# Patient Record
Sex: Male | Born: 1960 | Race: White | Hispanic: No | Marital: Single | Smoking: Current every day smoker
Health system: Southern US, Community
[De-identification: ages and names within clinical notes are randomized; demographics above are authoritative.]

## PROBLEM LIST (undated history)

## (undated) DIAGNOSIS — I219 Acute myocardial infarction, unspecified: Secondary | ICD-10-CM

## (undated) DIAGNOSIS — M1990 Unspecified osteoarthritis, unspecified site: Secondary | ICD-10-CM

## (undated) DIAGNOSIS — G629 Polyneuropathy, unspecified: Secondary | ICD-10-CM

## (undated) DIAGNOSIS — H3500 Unspecified background retinopathy: Secondary | ICD-10-CM

## (undated) DIAGNOSIS — C801 Malignant (primary) neoplasm, unspecified: Secondary | ICD-10-CM

## (undated) DIAGNOSIS — N186 End stage renal disease: Secondary | ICD-10-CM

## (undated) DIAGNOSIS — K219 Gastro-esophageal reflux disease without esophagitis: Secondary | ICD-10-CM

## (undated) DIAGNOSIS — I10 Essential (primary) hypertension: Secondary | ICD-10-CM

## (undated) DIAGNOSIS — I2510 Atherosclerotic heart disease of native coronary artery without angina pectoris: Secondary | ICD-10-CM

## (undated) DIAGNOSIS — F329 Major depressive disorder, single episode, unspecified: Secondary | ICD-10-CM

## (undated) DIAGNOSIS — Z992 Dependence on renal dialysis: Secondary | ICD-10-CM

## (undated) DIAGNOSIS — K209 Esophagitis, unspecified: Secondary | ICD-10-CM

## (undated) DIAGNOSIS — K769 Liver disease, unspecified: Secondary | ICD-10-CM

## (undated) DIAGNOSIS — F32A Depression, unspecified: Secondary | ICD-10-CM

## (undated) DIAGNOSIS — F419 Anxiety disorder, unspecified: Secondary | ICD-10-CM

## (undated) HISTORY — PX: EYE SURGERY: SHX253

## (undated) HISTORY — PX: AMPUTATION: SHX166

## (undated) HISTORY — PX: COLOSTOMY: SHX63

## (undated) HISTORY — DX: Atherosclerotic heart disease of native coronary artery without angina pectoris: I25.10

---

## 1998-02-03 DIAGNOSIS — I219 Acute myocardial infarction, unspecified: Secondary | ICD-10-CM

## 1998-02-03 HISTORY — DX: Acute myocardial infarction, unspecified: I21.9

## 2007-02-04 HISTORY — PX: COLON SURGERY: SHX602

## 2010-03-29 DIAGNOSIS — I517 Cardiomegaly: Secondary | ICD-10-CM

## 2010-04-08 DIAGNOSIS — R072 Precordial pain: Secondary | ICD-10-CM

## 2010-05-28 ENCOUNTER — Ambulatory Visit (INDEPENDENT_AMBULATORY_CARE_PROVIDER_SITE_OTHER): Payer: Medicare Other | Admitting: Internal Medicine

## 2010-07-04 ENCOUNTER — Ambulatory Visit (INDEPENDENT_AMBULATORY_CARE_PROVIDER_SITE_OTHER): Payer: Medicare Other | Admitting: Internal Medicine

## 2011-02-04 DIAGNOSIS — K209 Esophagitis, unspecified without bleeding: Secondary | ICD-10-CM

## 2011-02-04 HISTORY — DX: Esophagitis, unspecified without bleeding: K20.90

## 2011-03-10 ENCOUNTER — Other Ambulatory Visit (HOSPITAL_COMMUNITY): Payer: Self-pay | Admitting: Family Medicine

## 2011-03-10 DIAGNOSIS — R918 Other nonspecific abnormal finding of lung field: Secondary | ICD-10-CM

## 2011-03-18 ENCOUNTER — Other Ambulatory Visit (HOSPITAL_COMMUNITY): Payer: Medicare Other

## 2011-04-09 ENCOUNTER — Encounter (HOSPITAL_COMMUNITY)
Admission: RE | Admit: 2011-04-09 | Discharge: 2011-04-09 | Disposition: A | Discharge status: Home / Self Care | Payer: Medicare Other | Source: Ambulatory Visit | Attending: Family Medicine | Admitting: Family Medicine

## 2011-04-09 DIAGNOSIS — R918 Other nonspecific abnormal finding of lung field: Secondary | ICD-10-CM

## 2011-04-09 DIAGNOSIS — J984 Other disorders of lung: Secondary | ICD-10-CM | POA: Insufficient documentation

## 2011-04-09 DIAGNOSIS — R222 Localized swelling, mass and lump, trunk: Secondary | ICD-10-CM | POA: Insufficient documentation

## 2011-04-09 DIAGNOSIS — J8410 Pulmonary fibrosis, unspecified: Secondary | ICD-10-CM | POA: Insufficient documentation

## 2011-04-09 LAB — GLUCOSE, CAPILLARY: Glucose-Capillary: 280 mg/dL — ABNORMAL HIGH (ref 70–99)

## 2011-04-09 MED ORDER — FLUDEOXYGLUCOSE F - 18 (FDG) INJECTION
14.0000 | Freq: Once | INTRAVENOUS | Status: AC | PRN
  Administered 2011-04-09: 14 via INTRAVENOUS

## 2011-05-22 ENCOUNTER — Inpatient Hospital Stay (HOSPITAL_COMMUNITY): Payer: Medicare Other

## 2011-05-22 ENCOUNTER — Other Ambulatory Visit (HOSPITAL_COMMUNITY): Payer: Medicare Other

## 2011-05-22 ENCOUNTER — Observation Stay (HOSPITAL_COMMUNITY): Payer: Medicare Other

## 2011-05-22 ENCOUNTER — Inpatient Hospital Stay (HOSPITAL_COMMUNITY)
Admission: EM | Admit: 2011-05-22 | Discharge: 2011-05-26 | DRG: 391 | Disposition: A | Discharge status: Home / Self Care | Payer: Medicare Other | Source: Ambulatory Visit | Attending: Cardiovascular Disease | Admitting: Cardiovascular Disease

## 2011-05-22 ENCOUNTER — Encounter (HOSPITAL_COMMUNITY): Payer: Self-pay | Admitting: Physician Assistant

## 2011-05-22 ENCOUNTER — Encounter (HOSPITAL_COMMUNITY)
Admission: EM | Disposition: A | Discharge status: Home / Self Care | Payer: Self-pay | Source: Ambulatory Visit | Attending: Cardiovascular Disease

## 2011-05-22 DIAGNOSIS — K209 Esophagitis, unspecified without bleeding: Principal | ICD-10-CM | POA: Diagnosis present

## 2011-05-22 DIAGNOSIS — I517 Cardiomegaly: Secondary | ICD-10-CM

## 2011-05-22 DIAGNOSIS — Z794 Long term (current) use of insulin: Secondary | ICD-10-CM

## 2011-05-22 DIAGNOSIS — E875 Hyperkalemia: Secondary | ICD-10-CM | POA: Diagnosis present

## 2011-05-22 DIAGNOSIS — E1142 Type 2 diabetes mellitus with diabetic polyneuropathy: Secondary | ICD-10-CM | POA: Diagnosis present

## 2011-05-22 DIAGNOSIS — K2970 Gastritis, unspecified, without bleeding: Secondary | ICD-10-CM | POA: Diagnosis present

## 2011-05-22 DIAGNOSIS — I214 Non-ST elevation (NSTEMI) myocardial infarction: Secondary | ICD-10-CM

## 2011-05-22 DIAGNOSIS — I129 Hypertensive chronic kidney disease with stage 1 through stage 4 chronic kidney disease, or unspecified chronic kidney disease: Secondary | ICD-10-CM | POA: Diagnosis present

## 2011-05-22 DIAGNOSIS — R0789 Other chest pain: Secondary | ICD-10-CM | POA: Diagnosis present

## 2011-05-22 DIAGNOSIS — N179 Acute kidney failure, unspecified: Secondary | ICD-10-CM | POA: Diagnosis present

## 2011-05-22 DIAGNOSIS — E1139 Type 2 diabetes mellitus with other diabetic ophthalmic complication: Secondary | ICD-10-CM | POA: Diagnosis present

## 2011-05-22 DIAGNOSIS — E11319 Type 2 diabetes mellitus with unspecified diabetic retinopathy without macular edema: Secondary | ICD-10-CM | POA: Diagnosis present

## 2011-05-22 DIAGNOSIS — Z85038 Personal history of other malignant neoplasm of large intestine: Secondary | ICD-10-CM

## 2011-05-22 DIAGNOSIS — I2510 Atherosclerotic heart disease of native coronary artery without angina pectoris: Secondary | ICD-10-CM | POA: Diagnosis present

## 2011-05-22 DIAGNOSIS — D62 Acute posthemorrhagic anemia: Secondary | ICD-10-CM | POA: Diagnosis present

## 2011-05-22 DIAGNOSIS — Z87891 Personal history of nicotine dependence: Secondary | ICD-10-CM

## 2011-05-22 DIAGNOSIS — N183 Chronic kidney disease, stage 3 unspecified: Secondary | ICD-10-CM | POA: Diagnosis present

## 2011-05-22 DIAGNOSIS — E1310 Other specified diabetes mellitus with ketoacidosis without coma: Secondary | ICD-10-CM | POA: Diagnosis present

## 2011-05-22 DIAGNOSIS — D631 Anemia in chronic kidney disease: Secondary | ICD-10-CM | POA: Diagnosis present

## 2011-05-22 DIAGNOSIS — E1149 Type 2 diabetes mellitus with other diabetic neurological complication: Secondary | ICD-10-CM | POA: Diagnosis present

## 2011-05-22 DIAGNOSIS — R079 Chest pain, unspecified: Secondary | ICD-10-CM

## 2011-05-22 DIAGNOSIS — I10 Essential (primary) hypertension: Secondary | ICD-10-CM | POA: Diagnosis present

## 2011-05-22 DIAGNOSIS — E119 Type 2 diabetes mellitus without complications: Secondary | ICD-10-CM | POA: Diagnosis present

## 2011-05-22 DIAGNOSIS — E1165 Type 2 diabetes mellitus with hyperglycemia: Secondary | ICD-10-CM | POA: Diagnosis present

## 2011-05-22 HISTORY — DX: Essential (primary) hypertension: I10

## 2011-05-22 HISTORY — DX: Polyneuropathy, unspecified: G62.9

## 2011-05-22 HISTORY — DX: Malignant (primary) neoplasm, unspecified: C80.1

## 2011-05-22 HISTORY — DX: Unspecified background retinopathy: H35.00

## 2011-05-22 HISTORY — PX: LEFT HEART CATHETERIZATION WITH CORONARY ANGIOGRAM: SHX5451

## 2011-05-22 HISTORY — DX: Gastro-esophageal reflux disease without esophagitis: K21.9

## 2011-05-22 HISTORY — PX: CARDIAC CATHETERIZATION: SHX172

## 2011-05-22 LAB — DIFFERENTIAL
Eosinophils Absolute: 0 10*3/uL (ref 0.0–0.7)
Eosinophils Relative: 0 % (ref 0–5)
Lymphs Abs: 1.2 10*3/uL (ref 0.7–4.0)
Monocytes Absolute: 0.5 10*3/uL (ref 0.1–1.0)
Monocytes Relative: 3 % (ref 3–12)

## 2011-05-22 LAB — BASIC METABOLIC PANEL
BUN: 56 mg/dL — ABNORMAL HIGH (ref 6–23)
Creatinine, Ser: 4.79 mg/dL — ABNORMAL HIGH (ref 0.50–1.35)
GFR calc non Af Amer: 13 mL/min — ABNORMAL LOW (ref >90)
Glucose, Bld: 191 mg/dL — ABNORMAL HIGH (ref 70–99)
Potassium: 5.9 mEq/L — ABNORMAL HIGH (ref 3.5–5.1)

## 2011-05-22 LAB — COMPREHENSIVE METABOLIC PANEL
ALT: 19 U/L (ref 0–53)
AST: 13 U/L (ref 0–37)
Albumin: 2.6 g/dL — ABNORMAL LOW (ref 3.5–5.2)
Alkaline Phosphatase: 145 U/L — ABNORMAL HIGH (ref 39–117)
Potassium: 5 mEq/L (ref 3.5–5.1)
Sodium: 136 mEq/L (ref 135–145)
Total Protein: 6 g/dL (ref 6.0–8.3)

## 2011-05-22 LAB — CBC
HCT: 37.8 % — ABNORMAL LOW (ref 39.0–52.0)
Hemoglobin: 12.7 g/dL — ABNORMAL LOW (ref 13.0–17.0)
Hemoglobin: 12.8 g/dL — ABNORMAL LOW (ref 13.0–17.0)
MCHC: 34 g/dL (ref 30.0–36.0)
RBC: 4.38 MIL/uL (ref 4.22–5.81)
RDW: 13.5 % (ref 11.5–15.5)

## 2011-05-22 LAB — CREATININE, SERUM
Creatinine, Ser: 4.67 mg/dL — ABNORMAL HIGH (ref 0.50–1.35)
GFR calc non Af Amer: 13 mL/min — ABNORMAL LOW (ref >90)

## 2011-05-22 LAB — POCT I-STAT, CHEM 8
BUN: 51 mg/dL — ABNORMAL HIGH (ref 6–23)
Creatinine, Ser: 4.8 mg/dL — ABNORMAL HIGH (ref 0.50–1.35)
Potassium: 5.1 mEq/L (ref 3.5–5.1)
Sodium: 138 mEq/L (ref 135–145)

## 2011-05-22 LAB — CARDIAC PANEL(CRET KIN+CKTOT+MB+TROPI)
CK, MB: 3 ng/mL (ref 0.3–4.0)
Relative Index: INVALID (ref 0.0–2.5)
Relative Index: INVALID (ref 0.0–2.5)
Relative Index: INVALID (ref 0.0–2.5)
Total CK: 68 U/L (ref 7–232)
Total CK: 83 U/L (ref 7–232)

## 2011-05-22 LAB — GLUCOSE, CAPILLARY
Glucose-Capillary: 402 mg/dL — ABNORMAL HIGH (ref 70–99)
Glucose-Capillary: 290 mg/dL — ABNORMAL HIGH (ref 70–99)
Glucose-Capillary: 238 mg/dL — ABNORMAL HIGH (ref 70–99)
Glucose-Capillary: 101 mg/dL — ABNORMAL HIGH (ref 70–99)
Glucose-Capillary: 136 mg/dL — ABNORMAL HIGH (ref 70–99)
Glucose-Capillary: 216 mg/dL — ABNORMAL HIGH (ref 70–99)

## 2011-05-22 LAB — LIPID PANEL
HDL: 32 mg/dL — ABNORMAL LOW (ref >39)
LDL Cholesterol: 93 mg/dL (ref 0–99)
Triglycerides: 270 mg/dL — ABNORMAL HIGH (ref <150)
VLDL: 54 mg/dL — ABNORMAL HIGH (ref 0–40)

## 2011-05-22 LAB — MAGNESIUM: Magnesium: 1.9 mg/dL (ref 1.5–2.5)

## 2011-05-22 LAB — MRSA PCR SCREENING: MRSA by PCR: NEGATIVE

## 2011-05-22 LAB — TSH: TSH: 1.238 u[IU]/mL (ref 0.350–4.500)

## 2011-05-22 SURGERY — LEFT HEART CATHETERIZATION WITH CORONARY ANGIOGRAM
Anesthesia: LOCAL

## 2011-05-22 MED ORDER — METOPROLOL TARTRATE 1 MG/ML IV SOLN
INTRAVENOUS | Status: AC
  Filled 2011-05-22: qty 5

## 2011-05-22 MED ORDER — SODIUM CHLORIDE 0.9 % IV SOLN
INTRAVENOUS | Status: DC
  Administered 2011-05-22: 3.3 [IU]/h via INTRAVENOUS
  Filled 2011-05-22: qty 1

## 2011-05-22 MED ORDER — INSULIN GLARGINE 100 UNIT/ML ~~LOC~~ SOLN
50.0000 [IU] | Freq: Once | SUBCUTANEOUS | Status: AC
  Administered 2011-05-22: 50 [IU] via SUBCUTANEOUS

## 2011-05-22 MED ORDER — HEPARIN SODIUM (PORCINE) 1000 UNIT/ML IJ SOLN
INTRAMUSCULAR | Status: AC
  Filled 2011-05-22: qty 1

## 2011-05-22 MED ORDER — SODIUM CHLORIDE 0.9 % IV SOLN: 250.0000 mL | INTRAVENOUS | Status: DC | PRN

## 2011-05-22 MED ORDER — SODIUM CHLORIDE 0.9 % IV SOLN: INTRAVENOUS | Status: DC

## 2011-05-22 MED ORDER — FENTANYL CITRATE 0.05 MG/ML IJ SOLN
INTRAMUSCULAR | Status: AC
  Filled 2011-05-22: qty 2

## 2011-05-22 MED ORDER — DEXTROSE-NACL 5-0.45 % IV SOLN: INTRAVENOUS | Status: DC

## 2011-05-22 MED ORDER — ACETAMINOPHEN 325 MG PO TABS: 650.0000 mg | ORAL_TABLET | ORAL | Status: DC | PRN

## 2011-05-22 MED ORDER — SODIUM CHLORIDE 0.9 % IV SOLN
80.0000 mg | Freq: Once | INTRAVENOUS | Status: AC
  Administered 2011-05-23: 80 mg via INTRAVENOUS
  Filled 2011-05-22 (×3): qty 80

## 2011-05-22 MED ORDER — OXYCODONE HCL 5 MG PO TABS: 5.0000 mg | ORAL_TABLET | ORAL | Status: DC | PRN

## 2011-05-22 MED ORDER — ATORVASTATIN CALCIUM 40 MG PO TABS
40.0000 mg | ORAL_TABLET | Freq: Every day | ORAL | Status: DC
  Administered 2011-05-22 – 2011-05-26 (×5): 40 mg via ORAL
  Filled 2011-05-22 (×5): qty 1

## 2011-05-22 MED ORDER — INSULIN ASPART 100 UNIT/ML ~~LOC~~ SOLN: 0.0000 [IU] | Freq: Three times a day (TID) | SUBCUTANEOUS | Status: DC

## 2011-05-22 MED ORDER — ACETONE (URINE) TEST VI STRP: 1.0000 | ORAL_STRIP | Status: DC | PRN

## 2011-05-22 MED ORDER — INSULIN REGULAR BOLUS VIA INFUSION: 0.0000 [IU] | Freq: Three times a day (TID) | INTRAVENOUS | Status: DC

## 2011-05-22 MED ORDER — ASPIRIN EC 325 MG PO TBEC: 325.0000 mg | DELAYED_RELEASE_TABLET | Freq: Every day | ORAL | Status: DC

## 2011-05-22 MED ORDER — ONDANSETRON HCL 4 MG/2ML IJ SOLN
INTRAMUSCULAR | Status: AC
  Administered 2011-05-23: 4 mg via INTRAVENOUS
  Filled 2011-05-22: qty 2

## 2011-05-22 MED ORDER — ONDANSETRON HCL 4 MG/2ML IJ SOLN: 4.0000 mg | Freq: Four times a day (QID) | INTRAMUSCULAR | Status: DC | PRN

## 2011-05-22 MED ORDER — DEXTROSE 50 % IV SOLN: 25.0000 mL | INTRAVENOUS | Status: DC | PRN

## 2011-05-22 MED ORDER — DEXTROSE-NACL 5-0.45 % IV SOLN
INTRAVENOUS | Status: DC
  Administered 2011-05-22: 100 mL/h via INTRAVENOUS

## 2011-05-22 MED ORDER — SODIUM CHLORIDE 0.9 % IV SOLN
8.0000 mg/h | INTRAVENOUS | Status: DC
  Administered 2011-05-22 – 2011-05-25 (×5): 8 mg/h via INTRAVENOUS
  Filled 2011-05-22 (×13): qty 80

## 2011-05-22 MED ORDER — MIDAZOLAM HCL 2 MG/2ML IJ SOLN
INTRAMUSCULAR | Status: AC
  Filled 2011-05-22: qty 2

## 2011-05-22 MED ORDER — ALPRAZOLAM 0.25 MG PO TABS
0.2500 mg | ORAL_TABLET | Freq: Two times a day (BID) | ORAL | Status: DC | PRN
  Administered 2011-05-23: 0.25 mg via ORAL
  Filled 2011-05-22: qty 1

## 2011-05-22 MED ORDER — METOPROLOL TARTRATE 25 MG PO TABS
25.0000 mg | ORAL_TABLET | Freq: Two times a day (BID) | ORAL | Status: DC
  Administered 2011-05-22 – 2011-05-23 (×3): 25 mg via ORAL
  Filled 2011-05-22 (×3): qty 1

## 2011-05-22 MED ORDER — NITROGLYCERIN 0.4 MG SL SUBL: 0.4000 mg | SUBLINGUAL_TABLET | SUBLINGUAL | Status: DC | PRN

## 2011-05-22 MED ORDER — SODIUM CHLORIDE 0.9 % IJ SOLN: 3.0000 mL | INTRAMUSCULAR | Status: DC | PRN

## 2011-05-22 MED ORDER — HYDROMORPHONE HCL PF 1 MG/ML IJ SOLN
INTRAMUSCULAR | Status: AC
  Filled 2011-05-22: qty 1

## 2011-05-22 MED ORDER — ZOLPIDEM TARTRATE 5 MG PO TABS: 5.0000 mg | ORAL_TABLET | Freq: Every evening | ORAL | Status: DC | PRN

## 2011-05-22 MED ORDER — HEPARIN SODIUM (PORCINE) 5000 UNIT/ML IJ SOLN
5000.0000 [IU] | Freq: Three times a day (TID) | INTRAMUSCULAR | Status: DC
  Filled 2011-05-22 (×2): qty 1

## 2011-05-22 MED ORDER — ONDANSETRON HCL 4 MG/2ML IJ SOLN
4.0000 mg | Freq: Four times a day (QID) | INTRAMUSCULAR | Status: DC | PRN
  Administered 2011-05-22 – 2011-05-26 (×4): 4 mg via INTRAVENOUS
  Filled 2011-05-22 (×4): qty 2

## 2011-05-22 MED ORDER — ASPIRIN 81 MG PO CHEW: 324.0000 mg | CHEWABLE_TABLET | ORAL | Status: DC

## 2011-05-22 MED ORDER — ASPIRIN 300 MG RE SUPP
300.0000 mg | RECTAL | Status: DC
  Filled 2011-05-22: qty 1

## 2011-05-22 MED ORDER — HYDROMORPHONE HCL PF 1 MG/ML IJ SOLN
1.0000 mg | Freq: Once | INTRAMUSCULAR | Status: AC
  Administered 2011-05-22: 1 mg via INTRAVENOUS

## 2011-05-22 MED ORDER — SODIUM CHLORIDE 0.45 % IV SOLN
INTRAVENOUS | Status: DC
  Administered 2011-05-23 – 2011-05-24 (×5): via INTRAVENOUS

## 2011-05-22 MED ORDER — SODIUM POLYSTYRENE SULFONATE 15 GM/60ML PO SUSP
15.0000 g | Freq: Once | ORAL | Status: AC
  Administered 2011-05-23: 15 g via ORAL
  Filled 2011-05-22: qty 60

## 2011-05-22 MED ORDER — INSULIN GLARGINE 100 UNIT/ML ~~LOC~~ SOLN: 60.0000 [IU] | Freq: Every day | SUBCUTANEOUS | Status: DC

## 2011-05-22 MED ORDER — SODIUM CHLORIDE 0.9 % IJ SOLN
3.0000 mL | Freq: Two times a day (BID) | INTRAMUSCULAR | Status: DC
  Administered 2011-05-22 – 2011-05-26 (×6): 3 mL via INTRAVENOUS

## 2011-05-22 MED ORDER — MORPHINE SULFATE 4 MG/ML IJ SOLN
INTRAMUSCULAR | Status: AC
  Filled 2011-05-22: qty 1

## 2011-05-22 MED ORDER — GI COCKTAIL ~~LOC~~
30.0000 mL | ORAL | Status: AC
  Administered 2011-05-22 (×2): 30 mL via ORAL
  Filled 2011-05-22: qty 30

## 2011-05-22 MED ORDER — OXYCODONE HCL 10 MG PO TB12
30.0000 mg | ORAL_TABLET | Freq: Two times a day (BID) | ORAL | Status: DC
  Administered 2011-05-22 – 2011-05-24 (×3): 30 mg via ORAL
  Filled 2011-05-22 (×4): qty 3

## 2011-05-22 MED ORDER — ASPIRIN EC 81 MG PO TBEC: 81.0000 mg | DELAYED_RELEASE_TABLET | Freq: Every day | ORAL | Status: DC

## 2011-05-22 MED ORDER — INSULIN ASPART 100 UNIT/ML ~~LOC~~ SOLN: 0.0000 [IU] | Freq: Every day | SUBCUTANEOUS | Status: DC

## 2011-05-22 MED ORDER — HYDROMORPHONE HCL PF 1 MG/ML IJ SOLN
1.0000 mg | INTRAMUSCULAR | Status: DC | PRN
  Administered 2011-05-22 – 2011-05-23 (×3): 1 mg via INTRAVENOUS
  Filled 2011-05-22 (×3): qty 1

## 2011-05-22 NOTE — CV Procedure (Signed)
Cardiac Catheterization Procedure Note  Name: Joe Vincent MRN: [id number] DOB: 1960-03-13  Procedure: Left Heart Cath, Selective Coronary Angiography, aortic root angiography   Indication: 51 year old gentleman with long-standing poorly controlled diabetes. He presented with severe substernal chest pain. A Code STEMI was activated by EMS. Upon arrival he had continued chest pain rated at 8/10. His EKG was nondiagnostic, but his clinical presentation was very impressive. The patient had ongoing severe chest pain with nausea and vomiting. We suspected a circumflex infarct based on his clinical presentation and lack of major EKG changes. We elected to proceed with emergency cardiac catheterization and possible PCI.   Procedural Details: The right wrist was prepped, draped, and anesthetized with 1% lidocaine. Using the modified Seldinger technique, a 5 French sheath was introduced into the right radial artery. 2.5 mg of nicardipine was administered through the sheath, weight-based unfractionated heparin was administered intravenously. Standard Judkins catheters were used for selective coronary angiography.  Left ventricular pressure was recorded with a pigtail catheter. Left ventriculography was deferred because of severe kidney disease. An aortic root angiogram was done because of the patient's ongoing chest pain and lack of a coronary etiology. I was concerned aortic dissection and felt this was the best way to assess them with the least amount of contrast. Catheter exchanges were performed over an exchange length guidewire. There were no immediate procedural complications. A TR band was used for radial hemostasis at the completion of the procedure.  The patient was transferred to the post catheterization recovery area for further monitoring.  Procedural Findings: Hemodynamics: AO 125/76 LV 135/12   Coronary angiography: Coronary dominance: right  Left mainstem: The left main is patent. There is  minor irregularity but no high-grade stenosis is present.   Left anterior descending (LAD): The LAD is a large caliber vessel that reaches the left ventricular apex. There is mild nonobstructive plaque in the proximal vessel. The mid vessel also has mild disease. The vessel extends down and wraps around the left ventricular apex. The first diagonal branch provides and twin vessels and at the bifurcation point there is severe 80-90% stenosis of the diagonal branch.   Left circumflex (LCx): The circumflex is also a large caliber vessel. There is a moderate-sized first OM with 40-50% proximal stenosis. The second OM device and twin vessels and also has 50% proximal stenosis. There is no high-grade disease in the left circumflex.   Right coronary artery (RCA): The RCA is dominant. There is no significant obstructive disease throughout. It gives off an acute marginal branch, a PDA branch, and a small posterolateral branch. There is mild diffuse irregularity but no significant stenosis.   Aortic root angiography: The aortic root and ascending aorta appear angiographically normal. There is no significant dilatation. There is no evidence of aortic dissection. The aortic valve appears competent with no aortic insufficiency. There is normal distribution of the head and neck vessels.   Final Conclusions:   1. Mild nonobstructive LAD stenosis with severe branch vessel disease of the first diagonal 2. Moderate left circumflex stenosis 3. Mild nonobstructive RCA stenosis 4. Normal aortic root   Recommendations: I'm not sure how to explain this patient's presentation of severe substernal chest pain with associated vomiting. He does not have an obvious coronary culprit vessel and there is no obvious proximal aortic pathology. He is medically very complex with brittle diabetes. I going to ask for internal medicine consult by the hospitalist service. Unfortunately, when we ran his i-STAT labs his creatinine came back  markedly elevated at 4.8. I tried to minimize contrast the best I could, therefore ventriculography was deferred. A stat 2-D echocardiogram will be performed. The patient will be aggressively hydrated. Will cycle cardiac enzymes and continued to monitor him closely in the step down unit.   Tonny Bollman 05/22/2011, 11:16 AM

## 2011-05-22 NOTE — Consult Note (Signed)
The patient reports having a similar episode in 2009, unfortunately he cannot recall the findings of the EGD.  He reports having issues with dysphagia for the past 3 months and there is an associated weight loss from the 180's down to the 170's, but his weight tends to fluctuate as a baseline.  With the report of hematemesis I will perform an EGD for further evaluation.  He may have an esophagitis as a source of his chest pain.  The cardiac work up is negative at this time.

## 2011-05-22 NOTE — H&P (Signed)
History and Physical  Patient ID: Joe Vincent Patient ID: Joe Vincent MRN: [id number], DOB/AGE: 51/03/1960 51 y.o. Date of Encounter: 05/22/2011  Primary Physician: [facility] [address] [place], [place], [place] [address]  [phone]   Primary Cardiologist: none  Chief Complaint:  Chest pain  HPI: 51 year old male with a history of CAD. He was wakened by substernal chest pain at 2 am. It was associated with N&V and diaphoresis. He was slightly SOB as well. When his symptoms did not respond, he called EMS. They were concerned about his chest pain and gave him ASA 81 mg x 4 plus SL NTG x 3 with a decrease in his pain to an 8/10. His CBG was > 400. They transported him emergency traffic to [facility] and en route gave him a total of 10 mg of morphine. Upon arrival to the hospital, his chest pain was back up to a 10/10. He had a similar episode last week that resolved without intervention but he thought that was indigestion.  Past Medical History  Diagnosis Date  . Diabetes mellitus   . Hypertension    Heart trouble in 2000 in [place], cathed but unsure if PCI   . Cancer      Surgical History:  Past Surgical History  Procedure Date  . Cardiac catheterization   . Colostomy     I have reviewed the patient's current medications. Pending - Dr Burdine's office to send  Allergies:  Allergies  Allergen Reactions  . Codeine    History   Social History  . Marital Status: Divorced    Spouse Name: N/A    Number of Children: N/A  . Years of Education: N/A   Occupational History  . Not on file.   Social History Main Topics  . Smoking status: Former Smoker -- 1.0 packs/day for 30 years    Types: Cigarettes, Cigars  . Smokeless tobacco: Never Used   Comment: Quit about 6 months ago but still smokes and occasional cigar  . Alcohol Use: Not on file  . Drug Use: No  . Sexually Active:    History   Social History Narrative   All relatives are deceased  except a brother-in-law. Mostly cancer but his father had CAD also.     Family History  Problem Relation Age of Onset  . Cancer Mother   . Heart disease Father   . Cancer Father     Review of Systems: He does well with the colostomy. He has had no recent illnesses, fevers or chills. No melena. Full 14-point review of systems otherwise negative except as noted above.   Physical Exam: Afebrile, BP 170/80, HR 78, RR 20 General: Well developed, well nourished, male in no acute distress. Head: Normocephalic, atraumatic, sclera non-icteric, no xanthomas, nares are without discharge. Dentition: good Neck: No carotid bruits. JVD not elevated. No thyromegally Lungs: Good expansion bilaterally. without wheezes or rhonchi. Rales in the bases Heart: Regular rate and rhythm with S1 S2.  No S3 or S4.  No murmur, no rubs, or gallops appreciated. Abdomen: Soft, non-tender, non-distended with normoactive bowel sounds. No hepatomegaly. No rebound/guarding. Colostomy bag present in lower right quadrant without leakage or signs of infection Msk:  Strength and tone appear normal for age. No joint deformities or effusions, no spine or costo-vertebral angle tenderness. Extremities: No clubbing or cyanosis. No edema.  Distal pedal pulses are 2+ in 4 extrem Neuro: Alert and oriented X 3. Moves all extremities spontaneously.  No focal deficits noted. Psych:  Responds to questions appropriately with a normal affect. Skin: No rashes or lesions noted  Labs:  pending  Radiology/Studies:  pending  ECG: SR with no ST elevation seen, no old available for comparison  ASSESSMENT AND PLAN:  Principal Problem:  *Chest pain at rest Active Problems:  Diabetes mellitus  Hypertension  Plan: Pt was taken directly to the cath lab. Further evaluation and treatment depending on the results. We will screen for cardiac risk factors and check a HgA1c. Dr Burdine's office has been contacted for records and meds. They will  fax them over.   SignedBjorn Loser Barrett PA-C 05/22/2011, 10:44 AM   Patient seen, examined. Available data reviewed. Agree with findings, assessment, and plan as outlined by Theodore Demark, PA-C. Complex patient with multiple medical problems as outlined. Please see cardiac cath note for further details and plans. The patient does not have a clear cardiac source of chest pain. He will be evaluated by the hospitalist service for further management of his diabetes and other medical problems. Gastroenterology will also be consulted. Appreciate assistance from the various consultants and advanced.  Tonny Bollman, M.D. 05/22/2011 6:13 PM

## 2011-05-22 NOTE — Progress Notes (Signed)
Pt. came into ED as code stemi  alone and was immediately transported to cath lab for observation and  Pre-op procedures. Pt alert and communicating with staff.  Pt.requested that his brother - in -law be contacted. Contact was made and cath lab nurse gave update on pt. Medical status.

## 2011-05-22 NOTE — Progress Notes (Signed)
  Echocardiogram 2D Echocardiogram has been performed.  Joe Vincent 05/22/2011, 3:18 PM

## 2011-05-22 NOTE — Consult Note (Signed)
Reason for Consult: Evaluation of acute renal failure on chronic kidney disease Referring Physician: Thad Ranger MD ([facility]) Primary Physician: Dr [facility] [address] [place], [place], [place] [address]   Joe Vincent is an 51 y.o. male.  HPI: 51 year old Caucasian male with past medical history significant for diabetes with associated retinopathy, hypertension and consequent chronic kidney disease stage III (baseline creatinine ranging 2.1-2.5 based on outpatient labs from December 2012). He reports that he has seen Dr.Befekadu as an outpatient in the recent past and does not recall when his next visit is rescheduled for. Prior to moving to [facility], he reports that he was seeing a nephrologist up in [place] not remember name/practice.  Presented earlier today with substernal chest pain upon awakening associated with nausea, vomiting and diaphoresis. He also had some mild shortness of breath. He attempted self treatment for indigestion however, when unable to relieve symptoms contacted EMS. Due to the suspicious history of this chest pain in spite of a suggestive EKG, he was treated as a code STEMI and urgently underwent coronary angiography that fortunately revealed no culprit coronary lesion. Contrast sparing was was done and left ventriculography avoided due to an elevated i-STAT creatinine of 4.67. The patient is currently being aggressively hydrated and 2-D echo pending. No preceding nonsteroidal anti-inflammatory drug use noted and no recent hematuria/obstructive or irritative symptoms noted.  Past Medical History  Diagnosis Date  . Diabetes mellitus   . Hypertension   . Cancer     Colon    Past Surgical History  Procedure Date  . Cardiac catheterization   . Colostomy     Family History  Problem Relation Age of Onset  . Cancer Mother   . Heart disease Father   . Cancer Father     Social History:  reports that he has quit smoking. His smoking use  included Cigarettes and Cigars. He has a 30 pack-year smoking history. He has never used smokeless tobacco. He reports that he does not use illicit drugs. His alcohol history not on file.  Allergies:  Allergies  Allergen Reactions  . Codeine     swelling    Medications:  Scheduled:   . atorvastatin  40 mg Oral q1800  . fentaNYL      . gi cocktail  30 mL Oral To Cath  . heparin      . HYDROmorphone      .  HYDROmorphone (DILAUDID) injection  1 mg Intravenous Once  . metoprolol      . metoprolol tartrate  25 mg Oral BID  . midazolam      . ondansetron      . pantoprazole (PROTONIX) IV  80 mg Intravenous Once  . sodium chloride  3 mL Intravenous Q12H  . DISCONTD: aspirin  324 mg Oral NOW  . DISCONTD: aspirin EC  325 mg Oral Daily  . DISCONTD: aspirin EC  81 mg Oral Daily  . DISCONTD: aspirin  300 mg Rectal NOW  . DISCONTD: heparin  5,000 Units Subcutaneous Q8H  . DISCONTD: heparin  5,000 Units Subcutaneous Q8H  . DISCONTD: insulin aspart  0-15 Units Subcutaneous TID WC  . DISCONTD: insulin aspart  0-5 Units Subcutaneous QHS  . DISCONTD: insulin regular  0-10 Units Intravenous TID WC    Results for orders placed during the hospital encounter of 05/22/11 (from the past 48 hour(s))  CBC     Status: Abnormal   Collection Time   05/22/11 10:46 AM  Component Value Range Comment   WBC 13.1 (*) 4.0 - 10.5 (K/uL)    RBC 4.37  4.22 - 5.81 (MIL/uL)    Hemoglobin 12.7 (*) 13.0 - 17.0 (g/dL)    HCT 16.1 (*) 09.6 - 52.0 (%)    MCV 85.4  78.0 - 100.0 (fL)    MCH 29.1  26.0 - 34.0 (pg)    MCHC 34.0  30.0 - 36.0 (g/dL)    RDW 04.5  40.9 - 81.1 (%)    Platelets 199  150 - 400 (K/uL)   COMPREHENSIVE METABOLIC PANEL     Status: Abnormal   Collection Time   05/22/11 10:46 AM      Component Value Range Comment   Sodium 136  135 - 145 (mEq/L)    Potassium 5.0  3.5 - 5.1 (mEq/L)    Chloride 103  96 - 112 (mEq/L)    CO2 18 (*) 19 - 32 (mEq/L)    Glucose, Bld 418 (*) 70 - 99 (mg/dL)     BUN 56 (*) 6 - 23 (mg/dL)    Creatinine, Ser 9.14 (*) 0.50 - 1.35 (mg/dL)    Calcium 8.1 (*) 8.4 - 10.5 (mg/dL)    Total Protein 6.0  6.0 - 8.3 (g/dL)    Albumin 2.6 (*) 3.5 - 5.2 (g/dL)    AST 13  0 - 37 (U/L)    ALT 19  0 - 53 (U/L)    Alkaline Phosphatase 145 (*) 39 - 117 (U/L)    Total Bilirubin 0.2 (*) 0.3 - 1.2 (mg/dL)    GFR calc non Af Amer 13 (*) >90 (mL/min)    GFR calc Af Amer 15 (*) >90 (mL/min)   LIPID PANEL     Status: Abnormal   Collection Time   05/22/11 10:46 AM      Component Value Range Comment   Cholesterol 179  0 - 200 (mg/dL)    Triglycerides 782 (*) <150 (mg/dL)    HDL 32 (*) >95 (mg/dL)    Total CHOL/HDL Ratio 5.6      VLDL 54 (*) 0 - 40 (mg/dL)    LDL Cholesterol 93  0 - 99 (mg/dL)   PROTIME-INR     Status: Normal   Collection Time   05/22/11 10:46 AM      Component Value Range Comment   Prothrombin Time 14.3  11.6 - 15.2 (seconds)    INR 1.09  0.00 - 1.49    APTT     Status: Abnormal   Collection Time   05/22/11 10:46 AM      Component Value Range Comment   aPTT 23 (*) 24 - 37 (seconds)   CARDIAC PANEL(CRET KIN+CKTOT+MB+TROPI)     Status: Normal   Collection Time   05/22/11 10:46 AM      Component Value Range Comment   Total CK 48  7 - 232 (U/L)    CK, MB 2.9  0.3 - 4.0 (ng/mL)    Troponin I <0.30  <0.30 (ng/mL)    Relative Index RELATIVE INDEX IS INVALID  0.0 - 2.5    GLUCOSE, CAPILLARY     Status: Abnormal   Collection Time   05/22/11 11:21 AM      Component Value Range Comment   Glucose-Capillary 402 (*) 70 - 99 (mg/dL)   CARDIAC PANEL(CRET KIN+CKTOT+MB+TROPI)     Status: Normal   Collection Time   05/22/11 12:07 PM      Component Value Range Comment   Total CK 49  7 - 232 (U/L)    CK, MB 2.7  0.3 - 4.0 (ng/mL)    Troponin I <0.30  <0.30 (ng/mL)    Relative Index RELATIVE INDEX IS INVALID  0.0 - 2.5    CBC     Status: Abnormal   Collection Time   05/22/11 12:18 PM      Component Value Range Comment   WBC 13.9 (*) 4.0 - 10.5 (K/uL)    RBC  4.38  4.22 - 5.81 (MIL/uL)    Hemoglobin 12.8 (*) 13.0 - 17.0 (g/dL)    HCT 16.1 (*) 09.6 - 52.0 (%)    MCV 86.3  78.0 - 100.0 (fL)    MCH 29.2  26.0 - 34.0 (pg)    MCHC 33.9  30.0 - 36.0 (g/dL)    RDW 04.5  40.9 - 81.1 (%)    Platelets 191  150 - 400 (K/uL)   CREATININE, SERUM     Status: Abnormal   Collection Time   05/22/11 12:18 PM      Component Value Range Comment   Creatinine, Ser 4.67 (*) 0.50 - 1.35 (mg/dL)    GFR calc non Af Amer 13 (*) >90 (mL/min)    GFR calc Af Amer 15 (*) >90 (mL/min)   MAGNESIUM     Status: Normal   Collection Time   05/22/11 12:18 PM      Component Value Range Comment   Magnesium 1.9  1.5 - 2.5 (mg/dL)   DIFFERENTIAL     Status: Abnormal   Collection Time   05/22/11 12:18 PM      Component Value Range Comment   Neutrophils Relative 88 (*) 43 - 77 (%)    Neutro Abs 12.2 (*) 1.7 - 7.7 (K/uL)    Lymphocytes Relative 9 (*) 12 - 46 (%)    Lymphs Abs 1.2  0.7 - 4.0 (K/uL)    Monocytes Relative 3  3 - 12 (%)    Monocytes Absolute 0.5  0.1 - 1.0 (K/uL)    Eosinophils Relative 0  0 - 5 (%)    Eosinophils Absolute 0.0  0.0 - 0.7 (K/uL)    Basophils Relative 0  0 - 1 (%)    Basophils Absolute 0.0  0.0 - 0.1 (K/uL)   MRSA PCR SCREENING     Status: Normal   Collection Time   05/22/11 12:19 PM      Component Value Range Comment   MRSA by PCR NEGATIVE  NEGATIVE      Portable Chest X-ray 1 View  05/22/2011  *RADIOLOGY REPORT*  Clinical Data: Chest pain, nausea.  PORTABLE CHEST - 1 VIEW  Comparison: None.  Findings: Heart and mediastinal contours are within normal limits. No focal opacities or effusions.  No acute bony abnormality.  IMPRESSION: No active cardiopulmonary disease.  Original Report Authenticated By: Cyndie Chime, M.D.    Review of Systems  Constitutional: Positive for chills. Negative for fever, weight loss, malaise/fatigue and diaphoresis.  HENT: Positive for congestion and sore throat. Negative for hearing loss, ear pain, nosebleeds,  tinnitus and ear discharge.   Eyes: Positive for blurred vision. Negative for double vision, photophobia, discharge and redness.  Respiratory: Positive for cough and shortness of breath. Negative for hemoptysis, sputum production, wheezing and stridor.   Cardiovascular: Positive for chest pain and palpitations. Negative for orthopnea, claudication, leg swelling and PND.  Gastrointestinal: Positive for heartburn, nausea and vomiting. Negative for abdominal pain, diarrhea, constipation, blood in stool and melena.  Genitourinary: Negative.  Musculoskeletal: Negative.   Skin: Negative.  Negative for rash.  Neurological: Negative.  Negative for weakness and headaches.  Endo/Heme/Allergies: Negative.   Psychiatric/Behavioral: Negative.   All other systems reviewed and are negative.   Blood pressure 153/101, pulse 71, resp. rate 13, height 6' (1.829 m), weight 73.5 kg (162 lb 0.6 oz), SpO2 94.00%. Physical Exam  Nursing note and vitals reviewed. Constitutional: He is oriented to person, place, and time. He appears well-developed and well-nourished. No distress.  HENT:  Head: Normocephalic and atraumatic.  Mouth/Throat: Oropharynx is clear and moist. No oropharyngeal exudate.  Eyes: Conjunctivae and EOM are normal. Pupils are equal, round, and reactive to light. Right eye exhibits no discharge. Left eye exhibits no discharge. No scleral icterus.  Neck: Normal range of motion. Neck supple. No JVD present. No tracheal deviation present. No thyromegaly present.  Cardiovascular: Normal rate, regular rhythm and normal heart sounds.  Exam reveals no gallop and no friction rub.   No murmur heard. Respiratory: Effort normal and breath sounds normal. No stridor. No respiratory distress. He has no wheezes. He has no rales. He exhibits no tenderness.  GI: Soft. He exhibits no mass. There is tenderness. There is no rebound and no guarding.       Epigastric tenderness appreciated without any guarding    Musculoskeletal: Normal range of motion. He exhibits no edema and no tenderness.  Lymphadenopathy:    He has no cervical adenopathy.  Neurological: He is alert and oriented to person, place, and time. He displays normal reflexes. No cranial nerve deficit. Coordination normal.  Skin: Skin is warm and dry. No rash noted. He is not diaphoretic. No erythema.       Tattoos noted over upper extremities  Psychiatric: He has a normal mood and affect. His behavior is normal. Judgment normal.    Assessment/Plan: 1. 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 His acute renal failure appears to be probably hemodynamically mediated with his preceding history of nausea/vomiting and a likely volume depleted state upon reaching the hospital. It will be key to follow his renal function closely status post contrast exposure for worsening renal function and the need to intervene as required. Will check a urinalysis and obtain a renal ultrasound to rule out obstruction. Urine electrolytes will also be obtained to further corroborate clinical suspicion. Avoid nephrotoxins including NSAIDs. Maintain strict Input and Output monitoring including daily standing weights if feasible. Will continue to monitor clinically with labs and daily exams and intervene as indicated.  2. Chest pain: Negative coronary angiography for acute coronary syndrome-await gastroenterology evaluation for coffee ground emesis and possibly esophageal etiology. 3. Hyperglycemia/diabetes mellitus: Insulin adjustments per hospitalist service. 4. Hypertension: Suspected that this is propelled by his ongoing intravenous normal saline drip-continue to monitor for antihypertensive therapy requirements. 5. Anemia: We'll check an iron panel, ferritin and plan on starting ESA if hemoglobin is less  than 10 g/dL. Joe Weisner K. 05/22/2011, 2:39 PM

## 2011-05-22 NOTE — Consult Note (Signed)
Reason for Consult: Hemetemesis, N/V Referring Physician: Dr. Norval Morton Muehl is an 51 y.o. male.  HPI: Patient is a 51 year old man with complicated past history with uncontrolled diabetes, hypertension, previous history of CAD and colon cancer-status post colectomy and ileostomy is admitted to hospital with nausea, vomiting and chest pain- had cardiac cath with concern for STEMI- which turned out to be unremarkable. He woke up at 2 AM this morning with severe chest pain associated with nausea and vomiting. Initial vomitus was clear twice - that he started having bright red blood in vomit which turned to coffee-ground later. He reports having one more vomiting episode  before getting to cardiac cath-but none after that. Reports having odynophagia- pain with swallowing food, water since this morning following vomiting episodes. He denies taking any NSAIDs , doesn't drink alcohol and smokes 2 cigars a day.    had blood in vomit before in 2000-  and had EGD done- but says that he was in on having any problems. Had colon cancer diagnosed into the ninth and had colectomy with ileostomy in [place]. Does not have any pain or erythema surrounding the ostomy site. Denies any fever, chills, weight loss.   Past Medical History  Diagnosis Date  . Diabetes mellitus   . Hypertension   . Cancer     Colon    Past Surgical History  Procedure Date  . Cardiac catheterization   . Colostomy     Family History  Problem Relation Age of Onset  . Cancer Mother   . Heart disease Father   . Cancer Father     Social History:  reports that he has quit smoking. His smoking use included Cigarettes and Cigars. He has a 30 pack-year smoking history. He has never used smokeless tobacco. He reports that he does not use illicit drugs. His alcohol history not on file.  Allergies:  Allergies  Allergen Reactions  . Codeine     swelling    Medications: I have reviewed the patient's current  medications.  Results for orders placed during the hospital encounter of 05/22/11 (from the past 48 hour(s))  CBC     Status: Abnormal   Collection Time   05/22/11 10:46 AM      Component Value Range Comment   WBC 13.1 (*) 4.0 - 10.5 (K/uL)    RBC 4.37  4.22 - 5.81 (MIL/uL)    Hemoglobin 12.7 (*) 13.0 - 17.0 (g/dL)    HCT 65.7 (*) 84.6 - 52.0 (%)    MCV 85.4  78.0 - 100.0 (fL)    MCH 29.1  26.0 - 34.0 (pg)    MCHC 34.0  30.0 - 36.0 (g/dL)    RDW 96.2  95.2 - 84.1 (%)    Platelets 199  150 - 400 (K/uL)   COMPREHENSIVE METABOLIC PANEL     Status: Abnormal   Collection Time   05/22/11 10:46 AM      Component Value Range Comment   Sodium 136  135 - 145 (mEq/L)    Potassium 5.0  3.5 - 5.1 (mEq/L)    Chloride 103  96 - 112 (mEq/L)    CO2 18 (*) 19 - 32 (mEq/L)    Glucose, Bld 418 (*) 70 - 99 (mg/dL)    BUN 56 (*) 6 - 23 (mg/dL)    Creatinine, Ser 3.24 (*) 0.50 - 1.35 (mg/dL)    Calcium 8.1 (*) 8.4 - 10.5 (mg/dL)    Total Protein 6.0  6.0 - 8.3 (g/dL)  Albumin 2.6 (*) 3.5 - 5.2 (g/dL)    AST 13  0 - 37 (U/L)    ALT 19  0 - 53 (U/L)    Alkaline Phosphatase 145 (*) 39 - 117 (U/L)    Total Bilirubin 0.2 (*) 0.3 - 1.2 (mg/dL)    GFR calc non Af Amer 13 (*) >90 (mL/min)    GFR calc Af Amer 15 (*) >90 (mL/min)   LIPID PANEL     Status: Abnormal   Collection Time   05/22/11 10:46 AM      Component Value Range Comment   Cholesterol 179  0 - 200 (mg/dL)    Triglycerides 045 (*) <150 (mg/dL)    HDL 32 (*) >40 (mg/dL)    Total CHOL/HDL Ratio 5.6      VLDL 54 (*) 0 - 40 (mg/dL)    LDL Cholesterol 93  0 - 99 (mg/dL)   PROTIME-INR     Status: Normal   Collection Time   05/22/11 10:46 AM      Component Value Range Comment   Prothrombin Time 14.3  11.6 - 15.2 (seconds)    INR 1.09  0.00 - 1.49    APTT     Status: Abnormal   Collection Time   05/22/11 10:46 AM      Component Value Range Comment   aPTT 23 (*) 24 - 37 (seconds)   CARDIAC PANEL(CRET KIN+CKTOT+MB+TROPI)     Status: Normal    Collection Time   05/22/11 10:46 AM      Component Value Range Comment   Total CK 48  7 - 232 (U/L)    CK, MB 2.9  0.3 - 4.0 (ng/mL)    Troponin I <0.30  <0.30 (ng/mL)    Relative Index RELATIVE INDEX IS INVALID  0.0 - 2.5    GLUCOSE, CAPILLARY     Status: Abnormal   Collection Time   05/22/11 11:21 AM      Component Value Range Comment   Glucose-Capillary 402 (*) 70 - 99 (mg/dL)   CARDIAC PANEL(CRET KIN+CKTOT+MB+TROPI)     Status: Normal   Collection Time   05/22/11 12:07 PM      Component Value Range Comment   Total CK 49  7 - 232 (U/L)    CK, MB 2.7  0.3 - 4.0 (ng/mL)    Troponin I <0.30  <0.30 (ng/mL)    Relative Index RELATIVE INDEX IS INVALID  0.0 - 2.5    CBC     Status: Abnormal   Collection Time   05/22/11 12:18 PM      Component Value Range Comment   WBC 13.9 (*) 4.0 - 10.5 (K/uL)    RBC 4.38  4.22 - 5.81 (MIL/uL)    Hemoglobin 12.8 (*) 13.0 - 17.0 (g/dL)    HCT 98.1 (*) 19.1 - 52.0 (%)    MCV 86.3  78.0 - 100.0 (fL)    MCH 29.2  26.0 - 34.0 (pg)    MCHC 33.9  30.0 - 36.0 (g/dL)    RDW 47.8  29.5 - 62.1 (%)    Platelets 191  150 - 400 (K/uL)   CREATININE, SERUM     Status: Abnormal   Collection Time   05/22/11 12:18 PM      Component Value Range Comment   Creatinine, Ser 4.67 (*) 0.50 - 1.35 (mg/dL)    GFR calc non Af Amer 13 (*) >90 (mL/min)    GFR calc Af Amer 15 (*) >90 (mL/min)  MAGNESIUM     Status: Normal   Collection Time   05/22/11 12:18 PM      Component Value Range Comment   Magnesium 1.9  1.5 - 2.5 (mg/dL)   DIFFERENTIAL     Status: Abnormal   Collection Time   05/22/11 12:18 PM      Component Value Range Comment   Neutrophils Relative 88 (*) 43 - 77 (%)    Neutro Abs 12.2 (*) 1.7 - 7.7 (K/uL)    Lymphocytes Relative 9 (*) 12 - 46 (%)    Lymphs Abs 1.2  0.7 - 4.0 (K/uL)    Monocytes Relative 3  3 - 12 (%)    Monocytes Absolute 0.5  0.1 - 1.0 (K/uL)    Eosinophils Relative 0  0 - 5 (%)    Eosinophils Absolute 0.0  0.0 - 0.7 (K/uL)    Basophils  Relative 0  0 - 1 (%)    Basophils Absolute 0.0  0.0 - 0.1 (K/uL)   MRSA PCR SCREENING     Status: Normal   Collection Time   05/22/11 12:19 PM      Component Value Range Comment   MRSA by PCR NEGATIVE  NEGATIVE      Portable Chest X-ray 1 View  05/22/2011  *RADIOLOGY REPORT*  Clinical Data: Chest pain, nausea.  PORTABLE CHEST - 1 VIEW  Comparison: None.  Findings: Heart and mediastinal contours are within normal limits. No focal opacities or effusions.  No acute bony abnormality.  IMPRESSION: No active cardiopulmonary disease.  Original Report Authenticated By: Cyndie Chime, M.D.    ROS:  As stated above in the HPI otherwise negative.  Blood pressure 153/101, pulse 71, resp. rate 13, height 6' (1.829 m), weight 73.5 kg (162 lb 0.6 oz), SpO2 94.00%.  PE: Gen: NAD, Alert and Oriented HEENT:  [place]/AT, EOMI Neck: Supple, no LAD Lungs: CTA Bilaterally CV: RRR without M/G/R ABM: Soft, NTND, +BS, ileostomy bag in place, no erythema or discharge from ostomy site. Ext: No C/C/E  Assessment/Plan:  # Hematemesis, nausea and vomiting: Unclear etiology at present. Does not have history of GERD or peptic ulcer disease. Not taking any NSAIDs or drinking heavy alcohol.  Complains of odynophagia after vomiting episodes this morning.  Esophagitis/peptic ulcer disease high on differential. - Will get an EGD tomorrow , further recommendations after procedure. - Continue PPI.  # Chest pain: Burning quality associated with swallowing. - Continue pain meds for now. - EGD tomorrow.   # Colon Cancer: Status post colectomy and ileostomy-Looks stable for now. #Diabetes mellitus #CKD #Hypertension   Joe Vincent 05/22/2011, 2:32 PM

## 2011-05-22 NOTE — Consult Note (Addendum)
Medical Consultation   Joe Vincent  ZOX:[id number]  DOB: 11-Nov-1960  DOA: 05/22/2011  PCP: Dr Margot Ables (In [place], [place] per patient)  Requesting physician: Dr. Tonny Bollman- [facility]  Reason for consultation: Medical management   History of Present Illness: Patient is a 51 year old Caucasian male with a history of coronary disease, diabetes mellitus, insulin-dependent, diabetic nephropathy,  diabetic neuropathy, history of colon cancer was initially brought in as Code STEMI with chest pain at rest. The patient underwent cardiac cath, no stent was placed as per cardiac cath report, he had no evidence of aortic dissection. The patient had also labs drawn when creatinine came back markedly elevated at 4.6, glucose of 418 and medical consult was called. Upon my encounter, the patient is alert awake and oriented, post cath, reported that he woke up with the intractable nausea and vomiting, with epigastric abdominal pain and chest pain radiating to his throat, associated with burping and burning sensation. He also reported that he had bright red blood in his vomiting which subsequently became coffee-ground. He stated that he had similar episode last week which he thought as indigestion as well. He took Maalox with no relief. Patient is a poor historian, states he has no history of GERD or PUD, and is not taking any NSAIDs besides Lorcet. He also had reproducible chest wall tenderness on palpation. He continues to complain of chest pain 7/10 in intensity, sharp, currently with no nausea or vomiting. He reports that he did have an EGD in 2009, as he had similar symptoms but he does not remember the report of the EGD. The patient endorses having CKD but does not regularly follow up with any nephrologist.    He also endorses having visual changes/blurry vision for last month and half. He has an appointment with ophthalmologist.  Allergies:   Allergies  Allergen Reactions  . Codeine    swelling      Past Medical History  Diagnosis Date  . Diabetes mellitus   . Hypertension   . Cancer     Colon    Past Surgical History  Procedure Date  . Cardiac catheterization   . Colostomy     Social History:  reports that he has quit smoking. His smoking use included Cigarettes and Cigars. He has a 30 pack-year smoking history. He has never used smokeless tobacco. He reports that he does not use illicit drugs. His alcohol history not on file.  Family History  Problem Relation Age of Onset  . Cancer Mother   . Heart disease Father   . Cancer Father     Review of Systems:  Review of Systems:  Constitutional: Denies fever, chills, diaphoresis, appetite change and fatigue.  HEENT: Denies photophobia, eye pain, redness, hearing loss, ear pain, congestion, sore throat, rhinorrhea, sneezing, mouth sores, trouble swallowing, neck pain, neck stiffness and tinnitus.   Respiratory: Denies SOB, DOE, cough, and wheezing.   Cardiovascular: Please see history of present illness Gastrointestinal: Please see history of present illness  Genitourinary: Denies dysuria, urgency, frequency, hematuria, flank pain and difficulty urinating.  Musculoskeletal: Denies myalgias, back pain, joint swelling, arthralgias and gait problem.  Skin: Denies pallor, rash and wound.  Neurological: Denies dizziness, seizures, syncope, weakness, light-headedness, numbness and headaches.  Hematological: Denies adenopathy. Easy bruising, personal or family bleeding history  Psychiatric/Behavioral: Denies suicidal ideation, mood changes, confusion, nervousness, sleep disturbance and agitation   Physical Exam: Blood pressure 153/101, pulse 71, resp. rate 13, height 6' (1.829 m), weight 73.5 kg (162 lb  0.6 oz), SpO2 94.00%.  General: Alert and awake, oriented x3, not in any acute distress. HEENT: anicteric sclera, pupils reactive to light and accommodation, EOMI CVS: S1-S2 clear, no murmur rubs or  gallops Chest: clear to auscultation bilaterally, no wheezing, rales or rhonchi, reproducible chest wall tenderness on palpation  Abdomen: soft nontender, nondistended, normal bowel sounds, no organomegaly Extremities: no cyanosis, clubbing or edema noted bilaterally Neuro: Cranial nerves II-XII intact, no focal neurological deficits  Labs on Admission:  Basic Metabolic Panel:  Lab 05/22/11 1610 05/22/11 1046  NA -- 136  K -- 5.0  CL -- 103  CO2 -- 18*  GLUCOSE -- 418*  BUN -- 56*  CREATININE 4.67* 4.66*  CALCIUM -- 8.1*  MG 1.9 --  PHOS -- --   Liver Function Tests:  Lab 05/22/11 1046  AST 13  ALT 19  ALKPHOS 145*  BILITOT 0.2*  PROT 6.0  ALBUMIN 2.6*   No results found for this basename: LIPASE:2,AMYLASE:2 in the last 168 hours No results found for this basename: AMMONIA:2 in the last 168 hours CBC:  Lab 05/22/11 1218 05/22/11 1046  WBC 13.9* 13.1*  NEUTROABS 12.2* --  HGB 12.8* 12.7*  HCT 37.8* 37.3*  MCV 86.3 85.4  PLT 191 199   Cardiac Enzymes:  Lab 05/22/11 1207 05/22/11 1046  CKTOTAL 49 48  CKMB 2.7 2.9  CKMBINDEX -- --  TROPONINI <0.30 <0.30   BNP: No components found with this basename: POCBNP:2 CBG:  Lab 05/22/11 1121  GLUCAP 402*    Inpatient Medications:   Scheduled Meds:   . atorvastatin  40 mg Oral q1800  . fentaNYL      . gi cocktail  30 mL Oral To Cath  . heparin      . HYDROmorphone      .  HYDROmorphone (DILAUDID) injection  1 mg Intravenous Once  . metoprolol      . metoprolol tartrate  25 mg Oral BID  . midazolam      . ondansetron      . pantoprazole (PROTONIX) IV  80 mg Intravenous Once  . sodium chloride  3 mL Intravenous Q12H  . DISCONTD: aspirin  324 mg Oral NOW  . DISCONTD: aspirin EC  325 mg Oral Daily  . DISCONTD: aspirin EC  81 mg Oral Daily  . DISCONTD: aspirin  300 mg Rectal NOW  . DISCONTD: heparin  5,000 Units Subcutaneous Q8H  . DISCONTD: heparin  5,000 Units Subcutaneous Q8H  . DISCONTD: insulin aspart   0-15 Units Subcutaneous TID WC  . DISCONTD: insulin aspart  0-5 Units Subcutaneous QHS  . DISCONTD: insulin regular  0-10 Units Intravenous TID WC   Continuous Infusions:   . sodium chloride 150 mL/hr at 05/22/11 1125  . sodium chloride    . dextrose 5 % and 0.45% NaCl    . insulin (NOVOLIN-R) infusion    . pantoprozole (PROTONIX) infusion    . DISCONTD: sodium chloride    . DISCONTD: dextrose 5 % and 0.45% NaCl    . DISCONTD: insulin (NOVOLIN-R) infusion       Radiological Exams on Admission: No results found.  Impression/Recommendations Principal Problem:  *Chest pain at rest: Patient does have history of coronary artery disease, however cardiac cath done today showed no acute thrombus or severe coronary artery disease to explain patient's severe substernal chest pain. Patient now endorses having intractable nausea/vomiting with coffee-ground emesis ? If he has gastric/peptic ulcer disease. He also has some reproducible chest wall  tenderness as well. - I discussed in detail with Dr. Tonny Bollman (primary service), and have called gastroenterology consult (discussed with Dr. Elnoria Howard) - Placed him on IV protonix infusion, dc'ed aspirin, heparin   Active Problems:  Diabetes mellitus/mild DKA: Currently anion gap 15, BS in 400's - Obtain serum ketones, UA, start IV insulin with DKA protocol - Obtain HbA1c   Hypertension: Per primary service    Acute renal failure on CKD: Baseline creatinine unknown - Obtain renal ultrasound to rule out any obstruction, obtain records from patient's primary care physician (already ordered by [facility]) - Discussed with Dr. Kathe Mariner (nephrology) to follow, patient did receive contrast during the cardiac catheterization. Will continue patient on IV fluids and monitor creatinine closely.   Visual changes: No acute visual changes likely progression of diabetic retinopathy due to uncontrolled diabetes mellitus - Patient has appointment with  ophthalmology, outpatient followup  Thank you for this consultation, will follow.  Time Spent on Admission: 1 hour   Jerri Hargadon M.D. [facility] 05/22/2011, 1:41 PM

## 2011-05-23 ENCOUNTER — Encounter (HOSPITAL_COMMUNITY): Payer: Self-pay

## 2011-05-23 ENCOUNTER — Encounter (HOSPITAL_COMMUNITY)
Admission: EM | Disposition: A | Discharge status: Home / Self Care | Payer: Self-pay | Source: Ambulatory Visit | Attending: Cardiovascular Disease

## 2011-05-23 ENCOUNTER — Observation Stay (HOSPITAL_COMMUNITY): Payer: Medicare Other

## 2011-05-23 HISTORY — PX: ESOPHAGOGASTRODUODENOSCOPY: SHX5428

## 2011-05-23 LAB — GLUCOSE, CAPILLARY
Glucose-Capillary: 149 mg/dL — ABNORMAL HIGH (ref 70–99)
Glucose-Capillary: 221 mg/dL — ABNORMAL HIGH (ref 70–99)
Glucose-Capillary: 57 mg/dL — ABNORMAL LOW (ref 70–99)
Glucose-Capillary: 68 mg/dL — ABNORMAL LOW (ref 70–99)
Glucose-Capillary: 69 mg/dL — ABNORMAL LOW (ref 70–99)

## 2011-05-23 LAB — PROTEIN / CREATININE RATIO, URINE: Protein Creatinine Ratio: 7.64 — ABNORMAL HIGH (ref 0.00–0.15)

## 2011-05-23 LAB — LIPID PANEL
HDL: 31 mg/dL — ABNORMAL LOW (ref >39)
LDL Cholesterol: 74 mg/dL (ref 0–99)

## 2011-05-23 LAB — URINE MICROSCOPIC-ADD ON

## 2011-05-23 LAB — CBC
MCV: 87.9 fL (ref 78.0–100.0)
Platelets: 179 10*3/uL (ref 150–400)
RBC: 4.05 MIL/uL — ABNORMAL LOW (ref 4.22–5.81)
WBC: 10.9 10*3/uL — ABNORMAL HIGH (ref 4.0–10.5)

## 2011-05-23 LAB — BASIC METABOLIC PANEL
CO2: 25 mEq/L (ref 19–32)
Chloride: 106 mEq/L (ref 96–112)
Potassium: 4.3 mEq/L (ref 3.5–5.1)
Sodium: 139 mEq/L (ref 135–145)

## 2011-05-23 LAB — URINALYSIS, ROUTINE W REFLEX MICROSCOPIC
Bilirubin Urine: NEGATIVE
Glucose, UA: >1000 mg/dL — AB
Ketones, ur: NEGATIVE mg/dL
Specific Gravity, Urine: 1.034 — ABNORMAL HIGH (ref 1.005–1.030)
pH: 5.5 (ref 5.0–8.0)

## 2011-05-23 SURGERY — EGD (ESOPHAGOGASTRODUODENOSCOPY)
Anesthesia: Moderate Sedation

## 2011-05-23 MED ORDER — FENTANYL NICU IV SYRINGE 50 MCG/ML
INJECTION | INTRAMUSCULAR | Status: DC | PRN
  Administered 2011-05-23 (×3): 25 ug via INTRAVENOUS

## 2011-05-23 MED ORDER — DIPHENHYDRAMINE HCL 50 MG/ML IJ SOLN
INTRAMUSCULAR | Status: AC
  Filled 2011-05-23: qty 1

## 2011-05-23 MED ORDER — GLUCOSE 40 % PO GEL
1.0000 | ORAL | Status: DC | PRN
  Administered 2011-05-23: 37.5 g via ORAL

## 2011-05-23 MED ORDER — OXYCODONE HCL 5 MG/5ML PO SOLN
5.0000 mg | ORAL | Status: DC | PRN
  Administered 2011-05-23: 5 mg via ORAL
  Administered 2011-05-23 – 2011-05-24 (×2): 10 mg via ORAL
  Filled 2011-05-23: qty 5
  Filled 2011-05-23 (×2): qty 10

## 2011-05-23 MED ORDER — GLUCOSE 40 % PO GEL
ORAL | Status: AC
  Administered 2011-05-23: 37.5 g via ORAL
  Filled 2011-05-23: qty 1

## 2011-05-23 MED ORDER — AMLODIPINE BESYLATE 5 MG PO TABS
5.0000 mg | ORAL_TABLET | Freq: Every day | ORAL | Status: DC
  Filled 2011-05-23: qty 1

## 2011-05-23 MED ORDER — MIDAZOLAM HCL 10 MG/2ML IJ SOLN
INTRAMUSCULAR | Status: DC | PRN
  Administered 2011-05-23 (×3): 2.5 mg via INTRAVENOUS

## 2011-05-23 MED ORDER — MIDAZOLAM HCL 10 MG/2ML IJ SOLN
INTRAMUSCULAR | Status: AC
  Filled 2011-05-23: qty 4

## 2011-05-23 MED ORDER — SUCRALFATE 1 GM/10ML PO SUSP
1.0000 g | Freq: Three times a day (TID) | ORAL | Status: DC
  Administered 2011-05-23 – 2011-05-26 (×12): 1 g via ORAL
  Filled 2011-05-23 (×17): qty 10

## 2011-05-23 MED ORDER — AMLODIPINE BESYLATE 10 MG PO TABS
10.0000 mg | ORAL_TABLET | Freq: Every day | ORAL | Status: DC
  Administered 2011-05-23 – 2011-05-26 (×4): 10 mg via ORAL
  Filled 2011-05-23 (×4): qty 1

## 2011-05-23 MED ORDER — METOPROLOL TARTRATE 50 MG PO TABS
50.0000 mg | ORAL_TABLET | Freq: Two times a day (BID) | ORAL | Status: DC
  Administered 2011-05-23 – 2011-05-24 (×2): 50 mg via ORAL
  Filled 2011-05-23 (×3): qty 1

## 2011-05-23 MED ORDER — FENTANYL CITRATE 0.05 MG/ML IJ SOLN
INTRAMUSCULAR | Status: AC
  Filled 2011-05-23: qty 4

## 2011-05-23 MED ORDER — HYDROMORPHONE HCL PF 1 MG/ML IJ SOLN
1.0000 mg | INTRAMUSCULAR | Status: DC | PRN
  Administered 2011-05-23: 2 mg via INTRAVENOUS
  Administered 2011-05-23 – 2011-05-24 (×2): 1 mg via INTRAVENOUS
  Administered 2011-05-24: 2 mg via INTRAVENOUS
  Administered 2011-05-24: 1 mg via INTRAVENOUS
  Administered 2011-05-24 – 2011-05-25 (×2): 2 mg via INTRAVENOUS
  Administered 2011-05-25: 1 mg via INTRAVENOUS
  Administered 2011-05-25: 2 mg via INTRAVENOUS
  Administered 2011-05-26: 1 mg via INTRAVENOUS
  Administered 2011-05-26: 2 mg via INTRAVENOUS
  Filled 2011-05-23: qty 2
  Filled 2011-05-23: qty 1
  Filled 2011-05-23 (×3): qty 2
  Filled 2011-05-23 (×2): qty 1
  Filled 2011-05-23: qty 2
  Filled 2011-05-23 (×2): qty 1
  Filled 2011-05-23: qty 2

## 2011-05-23 MED ORDER — INSULIN ASPART 100 UNIT/ML ~~LOC~~ SOLN
0.0000 [IU] | Freq: Three times a day (TID) | SUBCUTANEOUS | Status: DC
Start: 2011-05-23 — End: 2011-05-27
  Administered 2011-05-26: 3 [IU] via SUBCUTANEOUS
  Administered 2011-05-26 (×2): 2 [IU] via SUBCUTANEOUS

## 2011-05-23 NOTE — Progress Notes (Signed)
[facility] - CONSULT F/U NOTE [place] TEAM 1 - Stepdown/ICU TEAM  Subjective: 51 year old Caucasian male with a history of coronary disease, diabetes mellitus, diabetic nephropathy, diabetic neuropathy, history of colon cancer was initially brought in as Code STEMI with chest pain at rest.  Post cath, reported that he woke up with intractable nausea and vomiting, with epigastric abdominal pain and chest pain radiating to his throat, associated with burping and burning sensation. He also reported that he had bright red blood in his vomiting which subsequently became coffee-ground.  Presently the pt is c/o severe epigastric and central chest pain associated w/ all attempts to swallow.  He denies any further hematemesis, sob, f/c, or chest tightness.   Objective: Weight change:   Intake/Output Summary (Last 24 hours) at 05/23/11 1657 Last data filed at 05/23/11 0800  Gross per 24 hour  Intake 1996.99 ml  Output    900 ml  Net 1096.99 ml   Blood pressure 145/86, pulse 61, temperature 98.5 F (36.9 C), temperature source Oral, resp. rate 17, height 6' (1.829 m), weight 75.5 kg (166 lb 7.2 oz), SpO2 99.00%.  CBG (last 3)   Basename 05/23/11 1643 05/23/11 1325 05/23/11 1200  GLUCAP 57* 85 77     Physical Exam: General: No acute respiratory distress Lungs: Clear to auscultation bilaterally without wheezes or crackles Cardiovascular: Regular rate and rhythm without murmur gallop or rub normal S1 and S2 Abdomen: Nontender, nondistended, soft, bowel sounds positive, no rebound, no ascites, no appreciable mass Extremities: No significant cyanosis, clubbing, or edema bilateral lower extremities  Lab Results:  Basename 05/23/11 0550 05/22/11 2114 05/22/11 1218 05/22/11 1050 05/22/11 1046  NA 139 140 -- 138 --  K 4.3 5.9* -- 5.1 --  CL 106 105 -- 111 --  CO2 25 26 -- -- 18*  GLUCOSE 74 191* -- 443* --  BUN 52* 56* -- 51* --  CREATININE 4.41* 4.79* 4.67* -- --  CALCIUM 8.2* 8.6  -- -- 8.1*  MG -- -- 1.9 -- --  PHOS -- -- -- -- --    Basename 05/22/11 1046  AST 13  ALT 19  ALKPHOS 145*  BILITOT 0.2*  PROT 6.0  ALBUMIN 2.6*    Basename 05/23/11 0550 05/22/11 1218 05/22/11 1050 05/22/11 1046  WBC 10.9* 13.9* -- 13.1*  NEUTROABS -- 12.2* -- --  HGB 11.6* 12.8* 12.9* --  HCT 35.6* 37.8* 38.0* --  MCV 87.9 86.3 -- 85.4  PLT 179 191 -- 199    Basename 05/22/11 2241 05/22/11 1746 05/22/11 1207  CKTOTAL 83 68 49  CKMB 3.1 3.0 2.7  CKMBINDEX -- -- --  TROPONINI <0.30 <0.30 <0.30    Basename 05/22/11 1046  HGBA1C 9.2*    Micro Results: Recent Results (from the past 240 hour(s))  MRSA PCR SCREENING     Status: Normal   Collection Time   05/22/11 12:19 PM      Component Value Range Status Comment   MRSA by PCR NEGATIVE  NEGATIVE  Final     Studies/Results: All recent x-ray/radiology reports have been reviewed in detail.   Medications: I have reviewed the patient's complete medication list.  Recommendations:  DM CBGs are well controlled at present - watch as diet advanced - watch for hypoglycemia due to lantus dose being given in setting of poor po intake - may require low rate D5NS if CBG cont to drop  Acute on chronic renal failure Nephrology is following  Chest pain - known hx of  CAD As per primary service (Cards) - pt is now s/p cardiac cath - likely mostly GI in nature  Esophagitis w/ hematemesis See recs as per GI - pt is now s/p EGD - will adjust pain meds  Mild normocytic anemia Likely only a minimal component of acute blood loss, with primary factor being CKD - follow w/ hydration  HTN BP is poorly controlled - I will adjust tx plan and follow   Dispo We will continue to follow along with you  Lonia Blood, MD [facility] Office  [phone] Pager [phone]  On-Call/Text Page:      [email or web address]      password [facility]

## 2011-05-23 NOTE — Consult Note (Signed)
WOC ostomy consult  Stoma type/location: RLQ, ileostomy, had had since 2009. Pt independent with his care, consult ordered for assistance with obtaining supplies for use while admitted. Ordered 1pc flat cut to fit with lock and roll closure, pt is familiar with this type of pouch, he has precut pouch in place currently but normally uses cut to fit pouches. Discussed with bedside nurse to provide pouches once they arrive to unit, for pt to apply.  He reports the pouch he has on has been in place for 3day and peristomal skin is starting to burn.  [place] # [phone] ordered per Diplomatic Services operational officer.    Re consult if needed, will not follow at this time. Thanks  Baby G[employer], CWOCN [phone])

## 2011-05-23 NOTE — Progress Notes (Signed)
Patient ID: Ossie Reif, male   DOB: 09/07/1960, 51 y.o.   MRN: [id number] S:still complaining of chest pain O:BP 160/74  Pulse 62  Temp(Src) 98.2 F (36.8 C) (Oral)  Resp 12  Ht 6' (1.829 m)  Wt 75.5 kg (166 lb 7.2 oz)  BMI 22.57 kg/m2  SpO2 97%  Intake/Output Summary (Last 24 hours) at 05/23/11 0902 Last data filed at 05/23/11 0800  Gross per 24 hour  Intake 2755.89 ml  Output    900 ml  Net 1855.89 ml   Weight change:  Gen:WD WN WM in NAD CVS:rrr no rub Resp:CTA Abd:benign Ext:no edema   Lab 05/23/11 0550 05/22/11 2114 05/22/11 1218 05/22/11 1050 05/22/11 1046  NA 139 140 -- 138 136  K 4.3 5.9* -- 5.1 5.0  CL 106 105 -- 111 103  CO2 25 26 -- -- 18*  GLUCOSE 74 191* -- 443* 418*  BUN 52* 56* -- 51* 56*  CREATININE 4.41* 4.79* 4.67* 4.80* 4.66*  ALB -- -- -- -- --  CALCIUM 8.2* 8.6 -- -- 8.1*  PHOS -- -- -- -- --  AST -- -- -- -- 13  ALT -- -- -- -- 19   Liver Function Tests:  Lab 05/22/11 1046  AST 13  ALT 19  ALKPHOS 145*  BILITOT 0.2*  PROT 6.0  ALBUMIN 2.6*   No results found for this basename: LIPASE:3,AMYLASE:3 in the last 168 hours No results found for this basename: AMMONIA:3 in the last 168 hours CBC:  Lab 05/23/11 0550 05/22/11 1218 05/22/11 1050 05/22/11 1046  WBC 10.9* 13.9* -- 13.1*  NEUTROABS -- 12.2* -- --  HGB 11.6* 12.8* 12.9* --  HCT 35.6* 37.8* 38.0* --  MCV 87.9 86.3 -- 85.4  PLT 179 191 -- 199   Cardiac Enzymes:  Lab 05/22/11 2241 05/22/11 1746 05/22/11 1207 05/22/11 1046  CKTOTAL 83 68 49 48  CKMB 3.1 3.0 2.7 2.9  CKMBINDEX -- -- -- --  TROPONINI <0.30 <0.30 <0.30 <0.30   CBG:  Lab 05/23/11 0840 05/23/11 0632 05/23/11 0505 05/23/11 0101 05/22/11 2359  GLUCAP 65* 73 71 149* 221*    Iron Studies: No results found for this basename: IRON,TIBC,TRANSFERRIN,FERRITIN in the last 72 hours Studies/Results: Portable Chest X-ray 1 View  05/22/2011  *RADIOLOGY REPORT*  Clinical Data: Chest pain, nausea.  PORTABLE CHEST - 1 VIEW   Comparison: None.  Findings: Heart and mediastinal contours are within normal limits. No focal opacities or effusions.  No acute bony abnormality.  IMPRESSION: No active cardiopulmonary disease.  Original Report Authenticated By: KEVIN G. DOVER, M.D.      . amLODipine  5 mg Oral Daily  . atorvastatin  40 mg Oral q1800  . fentaNYL      . gi cocktail  30 mL Oral To Cath  . heparin      . HYDROmorphone      .  HYDROmorphone (DILAUDID) injection  1 mg Intravenous Once  . insulin aspart  0-9 Units Subcutaneous TID WC  . insulin glargine  50 Units Subcutaneous Once  . metoprolol      . metoprolol tartrate  25 mg Oral BID  . midazolam      . ondansetron      . oxyCODONE  30 mg Oral Q12H  . pantoprazole (PROTONIX) IV  80 mg Intravenous Once  . sodium chloride  3 mL Intravenous Q12H  . sodium polystyrene  15 g Oral Once  . DISCONTD: aspirin  324 mg Oral NOW  .   DISCONTD: aspirin EC  325 mg Oral Daily  . DISCONTD: aspirin EC  81 mg Oral Daily  . DISCONTD: aspirin  300 mg Rectal NOW  . DISCONTD: heparin  5,000 Units Subcutaneous Q8H  . DISCONTD: heparin  5,000 Units Subcutaneous Q8H  . DISCONTD: insulin aspart  0-15 Units Subcutaneous TID WC  . DISCONTD: insulin aspart  0-5 Units Subcutaneous QHS  . DISCONTD: insulin glargine  60 Units Subcutaneous QHS  . DISCONTD: insulin regular  0-10 Units Intravenous TID WC    BMET    Component Value Date/Time   NA 139 05/23/2011 0550   K 4.3 05/23/2011 0550   CL 106 05/23/2011 0550   CO2 25 05/23/2011 0550   GLUCOSE 74 05/23/2011 0550   BUN 52* 05/23/2011 0550   CREATININE 4.41* 05/23/2011 0550   CALCIUM 8.2* 05/23/2011 0550   GFRNONAA 14* 05/23/2011 0550   GFRAA 16* 05/23/2011 0550   CBC    Component Value Date/Time   WBC 10.9* 05/23/2011 0550   RBC 4.05* 05/23/2011 0550   HGB 11.6* 05/23/2011 0550   HCT 35.6* 05/23/2011 0550   PLT 179 05/23/2011 0550   MCV 87.9 05/23/2011 0550   MCH 28.6 05/23/2011 0550   MCHC 32.6 05/23/2011 0550   RDW 13.7  05/23/2011 0550   LYMPHSABS 1.2 05/22/2011 1218   MONOABS 0.5 05/22/2011 1218   EOSABS 0.0 05/22/2011 1218   BASOSABS 0.0 05/22/2011 1218     Assessment/Plan:  1. 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  AKI likely due to volume depletion given N/V PTA, however now at risk for CIN however creat has actually decreased with IVF's.  Will need to follow for another 24 hours and if cont to improve will have pt f/u with his primary nephrologist, Dr. Befekadu.    Ways to delay CKD include: 1. Tight BP control with goal <130/80 2. Tight glucose control with goal A1c <7% 3. Use of an ACE or ARB (hold for now due to AKI/CKD) 4. Avoid nephrotoxic agents such as NSAID's, Cox-II Inhibitors, and contrast dyes. 2. DM- poorly controlled, adjustments per primary svc 3. CP- negative cardiac cath, non cardiac.  W/u per primary svc.  Agree with GI workup in light of burning sensation accompanied with N/V 4. Anemia- normocytic likely due to CKD vs ABLA from PUD/gastritis.  W/u underway. 5. HTN- poorly controlled increase amlodipine to 10mg daily  Keanthony Poole A 

## 2011-05-23 NOTE — Progress Notes (Signed)
Inpatient Diabetes Program Recommendations  AACE/ADA: New Consensus Statement on Inpatient Glycemic Control (2009)  Target Ranges:  Prepandial:   less than 140 mg/dL      Peak postprandial:   less than 180 mg/dL (1-2 hours)      Critically ill patients:  140 - 180 mg/dL   Reason for assessment: hypoglycemia this AM.    Inpatient Diabetes Program Recommendations Insulin - Basal: Lantus 30 units   Note: Patient will likely need at least 1/2 home dose Lantus once CBGs are stable.  Thank you  Piedad Climes [facility] Inpatient Diabetes Coordinator [phone]

## 2011-05-23 NOTE — Op Note (Signed)
[facility] [address] [place], [place]  [address]  OPERATIVE PROCEDURE REPORT  PATIENT:  Joe Vincent, Joe Vincent  MR#:  [id number] BIRTHDATE:  06-19-1960  GENDER:  male ENDOSCOPIST:  Jeani Hawking, MD ASSISTANT:  Claudie Revering, RN [facility] and Kizzie Bane PROCEDURE DATE:  05/23/2011 PROCEDURE:  EGD, diagnostic [id number] ASA CLASS:  Class III INDICATIONS:  Chest pain, hematemesis MEDICATIONS:  Fentanyl 75 mcg IV, Versed 7.5 mg IV  DESCRIPTION OF PROCEDURE:   After the risks benefits and alternatives of the procedure were thoroughly explained, informed consent was obtained.  The Pentax Gastroscope [id number] endoscope was introduced through the mouth and advanced to the second portion of the duodenum, without limitations.  The instrument was slowly withdrawn as the mucosa was fully examined. <<PROCEDUREIMAGES>>  FINDINGS:  An LA grade D esophagitis was identified. In the gastric fundus a mild gastritis was noted, but this may have been from the vomiting with resultant irritation of the mucosa. No other upper GI tract abnormalities.    Retroflexed views revealed no abnormalities.    The scope was then withdrawn from the patient and the procedure terminated.  COMPLICATIONS:  None  IMPRESSION:  1) LA Grade D esophagitis. 2) Fundic gastritis. RECOMMENDATIONS:  1) Continue with Protonix. 2) Add sucralfate.  ______________________________ Jeani Hawking, MD  n. Rosalie DoctorJeani Hawking at 05/23/2011 02:48 PM  Lewanda Rife, [phone]

## 2011-05-23 NOTE — H&P (View-Only) (Signed)
Patient ID: Joe Vincent, male   DOB: [birth year], 51 y.o.   MRN: [id number] S:still complaining of chest pain O:BP 160/74  Pulse 62  Temp(Src) 98.2 F (36.8 C) (Oral)  Resp 12  Ht 6' (1.829 m)  Wt 75.5 kg (166 lb 7.2 oz)  BMI 22.57 kg/m2  SpO2 97%  Intake/Output Summary (Last 24 hours) at 05/23/11 0902 Last data filed at 05/23/11 0800  Gross per 24 hour  Intake 2755.89 ml  Output    900 ml  Net 1855.89 ml   Weight change:  Gen:WD WN WM in NAD CVS:rrr no rub Resp:CTA WUJ:[id number] Ext:no edema   Lab 05/23/11 0550 05/22/11 2114 05/22/11 1218 05/22/11 1050 05/22/11 1046  NA 139 140 -- 138 136  K 4.3 5.9* -- 5.1 5.0  CL 106 105 -- 111 103  CO2 25 26 -- -- 18*  GLUCOSE 74 191* -- 443* 418*  BUN 52* 56* -- 51* 56*  CREATININE 4.41* 4.79* 4.67* 4.80* 4.66*  ALB -- -- -- -- --  CALCIUM 8.2* 8.6 -- -- 8.1*  PHOS -- -- -- -- --  AST -- -- -- -- 13  ALT -- -- -- -- 19   Liver Function Tests:  Lab 05/22/11 1046  AST 13  ALT 19  ALKPHOS 145*  BILITOT 0.2*  PROT 6.0  ALBUMIN 2.6*   No results found for this basename: LIPASE:3,AMYLASE:3 in the last 168 hours No results found for this basename: AMMONIA:3 in the last 168 hours CBC:  Lab 05/23/11 0550 05/22/11 1218 05/22/11 1050 05/22/11 1046  WBC 10.9* 13.9* -- 13.1*  NEUTROABS -- 12.2* -- --  HGB 11.6* 12.8* 12.9* --  HCT 35.6* 37.8* 38.0* --  MCV 87.9 86.3 -- 85.4  PLT 179 191 -- 199   Cardiac Enzymes:  Lab 05/22/11 2241 05/22/11 1746 05/22/11 1207 05/22/11 1046  CKTOTAL 83 68 49 48  CKMB 3.1 3.0 2.7 2.9  CKMBINDEX -- -- -- --  TROPONINI <0.30 <0.30 <0.30 <0.30   CBG:  Lab 05/23/11 0840 05/23/11 0632 05/23/11 0505 05/23/11 0101 05/22/11 2359  GLUCAP 65* 73 71 149* 221*    Iron Studies: No results found for this basename: IRON,TIBC,TRANSFERRIN,FERRITIN in the last 72 hours Studies/Results: Portable Chest X-ray 1 View  05/22/2011  *RADIOLOGY REPORT*  Clinical Data: Chest pain, nausea.  PORTABLE CHEST - 1 VIEW   Comparison: None.  Findings: Heart and mediastinal contours are within normal limits. No focal opacities or effusions.  No acute bony abnormality.  IMPRESSION: No active cardiopulmonary disease.  Original Report Authenticated By: Cyndie Chime, M.D.      . amLODipine  5 mg Oral Daily  . atorvastatin  40 mg Oral q1800  . fentaNYL      . gi cocktail  30 mL Oral To Cath  . heparin      . HYDROmorphone      .  HYDROmorphone (DILAUDID) injection  1 mg Intravenous Once  . insulin aspart  0-9 Units Subcutaneous TID WC  . insulin glargine  50 Units Subcutaneous Once  . metoprolol      . metoprolol tartrate  25 mg Oral BID  . midazolam      . ondansetron      . oxyCODONE  30 mg Oral Q12H  . pantoprazole (PROTONIX) IV  80 mg Intravenous Once  . sodium chloride  3 mL Intravenous Q12H  . sodium polystyrene  15 g Oral Once  . DISCONTD: aspirin  324 mg Oral NOW  .  DISCONTD: aspirin EC  325 mg Oral Daily  . DISCONTD: aspirin EC  81 mg Oral Daily  . DISCONTD: aspirin  300 mg Rectal NOW  . DISCONTD: heparin  5,000 Units Subcutaneous Q8H  . DISCONTD: heparin  5,000 Units Subcutaneous Q8H  . DISCONTD: insulin aspart  0-15 Units Subcutaneous TID WC  . DISCONTD: insulin aspart  0-5 Units Subcutaneous QHS  . DISCONTD: insulin glargine  60 Units Subcutaneous QHS  . DISCONTD: insulin regular  0-10 Units Intravenous TID WC    BMET    Component Value Date/Time   NA 139 05/23/2011 0550   K 4.3 05/23/2011 0550   CL 106 05/23/2011 0550   CO2 25 05/23/2011 0550   GLUCOSE 74 05/23/2011 0550   BUN 52* 05/23/2011 0550   CREATININE 4.41* 05/23/2011 0550   CALCIUM 8.2* 05/23/2011 0550   GFRNONAA 14* 05/23/2011 0550   GFRAA 16* 05/23/2011 0550   CBC    Component Value Date/Time   WBC 10.9* 05/23/2011 0550   RBC 4.05* 05/23/2011 0550   HGB 11.6* 05/23/2011 0550   HCT 35.6* 05/23/2011 0550   PLT 179 05/23/2011 0550   MCV 87.9 05/23/2011 0550   MCH 28.6 05/23/2011 0550   MCHC 32.6 05/23/2011 0550   RDW 13.7  05/23/2011 0550   LYMPHSABS 1.2 05/22/2011 1218   MONOABS 0.5 05/22/2011 1218   EOSABS 0.0 05/22/2011 1218   BASOSABS 0.0 05/22/2011 1218     Assessment/Plan:  1. 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  AKI likely due to volume depletion given N/V PTA, however now at risk for CIN however creat has actually decreased with IVF's.  Will need to follow for another 24 hours and if cont to improve will have pt f/u with his primary nephrologist, Dr. Kristian Vincent.    Ways to delay CKD include: 1. Tight BP control with goal <130/80 2. Tight glucose control with goal A1c <7% 3. Use of an ACE or ARB (hold for now due to AKI/CKD) 4. Avoid nephrotoxic agents such as NSAID's, Cox-II Inhibitors, and contrast dyes. 2. DM- poorly controlled, adjustments per primary svc 3. CP- negative cardiac cath, non cardiac.  W/u per primary svc.  Agree with GI workup in light of burning sensation accompanied with N/V 4. Anemia- normocytic likely due to CKD vs ABLA from PUD/gastritis.  W/u underway. 5. HTN- poorly controlled increase amlodipine to 10mg  daily  Joe Vincent

## 2011-05-23 NOTE — Progress Notes (Signed)
INITIAL ADULT NUTRITION ASSESSMENT Date: 05/23/2011   Time: 12:59 PM  Reason for Assessment: Nutrition Risk Report  ASSESSMENT: Male 51 y.o.  Dx: Chest pain at rest  Hx:  Past Medical History  Diagnosis Date  . Diabetes mellitus   . Hypertension   . GERD (gastroesophageal reflux disease)   . Retinopathy   . Peripheral neuropathy   . Chronic kidney disease   . Cancer     Colon    Related Meds:  Past Medical History  Diagnosis Date  . Diabetes mellitus   . Hypertension   . GERD (gastroesophageal reflux disease)   . Retinopathy   . Peripheral neuropathy   . Chronic kidney disease   . Cancer     Colon    Ht: 6' (182.9 cm)  Wt: 166 lb 7.2 oz (75.5 kg)  Ideal Wt: 81 kg % Ideal Wt: 93%  Usual Wt: 170-180 lb % Usual Wt: 92-97%  Body mass index is 22.57 kg/(m^2).  Food/Nutrition Related Hx: unintentional weight loss > 10 lbs within the last month & problems chewing or swallowing foods and/or liquids per admission nutrition screen  Labs:  CMP     Component Value Date/Time   NA 139 05/23/2011 0550   K 4.3 05/23/2011 0550   CL 106 05/23/2011 0550   CO2 25 05/23/2011 0550   GLUCOSE 74 05/23/2011 0550   BUN 52* 05/23/2011 0550   CREATININE 4.41* 05/23/2011 0550   CALCIUM 8.2* 05/23/2011 0550   PROT 6.0 05/22/2011 1046   ALBUMIN 2.6* 05/22/2011 1046   AST 13 05/22/2011 1046   ALT 19 05/22/2011 1046   ALKPHOS 145* 05/22/2011 1046   BILITOT 0.2* 05/22/2011 1046   GFRNONAA 14* 05/23/2011 0550   GFRAA 16* 05/23/2011 0550     Intake/Output Summary (Last 24 hours) at 05/23/11 1353 Last data filed at 05/23/11 0800  Gross per 24 hour  Intake 2455.89 ml  Output    900 ml  Net 1555.89 ml    CBG (last 3)   Basename 05/23/11 1325 05/23/11 1200 05/23/11 0958  GLUCAP 85 77 69*    Diet Order: NPO  Supplements/Tube Feeding: N/A  IVF:    sodium chloride Last Rate: 75 mL/hr at 05/23/11 0700  pantoprozole (PROTONIX) infusion Last Rate: 8 mg/hr (05/23/11 1234)  DISCONTD:  sodium chloride Last Rate: 150 mL/hr at 05/22/11 1125  DISCONTD: sodium chloride   DISCONTD: sodium chloride Last Rate: 150 mL/hr at 05/22/11 1345  DISCONTD: dextrose 5 % and 0.45% NaCl   DISCONTD: dextrose 5 % and 0.45% NaCl Last Rate: 100 mL/hr (05/22/11 1956)  DISCONTD: insulin (NOVOLIN-R) infusion   DISCONTD: insulin (NOVOLIN-R) infusion Last Rate: Stopped (05/23/11 0148)    Estimated Nutritional Needs:   Kcal: 1900-2100 Protein: 100-110 gm Fluid: 1.9-2.1 L  RD spoke with pt re: nutrition hx -- admitted with substernal chest pain associated with N/V and diaphoresis; s/p cardiac cath 4/18; he reports his weight fluctuates up and down (of about 10 lbs); states his appetite is good, however, PTA he "couldn't keep anything down;" reports pain with swallowing food; CWOCN note reviewed 4/19 -- pt with RLQ, ileostomy since 2009; cardiac workup is negative at this time; for EGD today for further evaluation  NUTRITION DIAGNOSIS: -Inadequate oral intake (NI-2.1).  Status: Ongoing  RELATED TO: inability to eat  AS EVIDENCE BY: NPO status  MONITORING/EVALUATION(Goals): Goal: meet >90% of estimated nutrition needs to optimize nutritional needs Monitor: PO intake, labs, weight, I/O's  EDUCATION NEEDS: -No education needs identified  at this time  INTERVENTION:  Await esophagogastroduodenoscopy (EGD) results  Consider speech path consult for swallow evaluation  RD to follow for nutrition care plan  Dietitian #: [phone]  DOCUMENTATION CODES Per approved criteria  -Not Applicable    Alger Memos 05/23/2011, 12:59 PM

## 2011-05-23 NOTE — Interval H&P Note (Signed)
History and Physical Interval Note:  05/23/2011 2:19 PM  Joe Vincent  has presented today for surgery, with the diagnosis of Chest pain, Hematemesis, Nausea/Vomiting  The various methods of treatment have been discussed with the patient and family. After consideration of risks, benefits and other options for treatment, the patient has consented to  Procedure(s) (LRB): ESOPHAGOGASTRODUODENOSCOPY (EGD) (N/A) as a surgical intervention .  The patients' history has been reviewed, patient examined, no change in status, stable for surgery.  I have reviewed the patients' chart and labs.  Questions were answered to the patient's satisfaction.     Eddie Payette D

## 2011-05-23 NOTE — Progress Notes (Signed)
    Subjective:  Still complains of chest but appears comfortable. No dyspnea, cough, or other complaints this am. Plans noted for endoscopy today. Appreciate GI and hospitalist management.  Objective:  Vital Signs in the last 24 hours: Temp:  [97.9 F (36.6 C)-98.3 F (36.8 C)] 98 F (36.7 C) (04/19 0325) Pulse Rate:  [58-102] 60  (04/19 0600) Resp:  [7-23] 12  (04/19 0600) BP: (141-174)/(76-126) 173/88 mmHg (04/19 0600) SpO2:  [92 %-100 %] 99 % (04/19 0600) Weight:  [73.5 kg (162 lb 0.6 oz)-75.5 kg (166 lb 7.2 oz)] 75.5 kg (166 lb 7.2 oz) (04/19 0548)  Intake/Output from previous day: 04/18 0701 - 04/19 0700 In: 2652.1 [I.V.:2650.1; IV Piggyback:2] Out: 650 [Urine:500; Stool:150]  Physical Exam: Pt is alert and oriented, NAD HEENT: normal Neck: JVP - normal Lungs: CTA bilaterally but prolonged expiration CV: RRR without murmur or gallop, distant Abd: soft, mild diffuse tenderness no rebound or guarding, Positive BS, no hepatomegaly Ext: no C/C/E, distal pulses intact and equal, right radial site is clear Skin: warm/dry no rash  Lab Results:  Basename 05/23/11 0550 05/22/11 1218  WBC 10.9* 13.9*  HGB 11.6* 12.8*  PLT 179 191    Basename 05/23/11 0550 05/22/11 2114  NA 139 140  K 4.3 5.9*  CL 106 105  CO2 25 26  GLUCOSE 74 191*  BUN 52* 56*  CREATININE 4.41* 4.79*    Basename 05/22/11 2241 05/22/11 1746  TROPONINI <0.30 <0.30    Cardiac Studies: Study Conclusions  - Left ventricle: The cavity size was normal. Wall thickness was increased in a pattern of moderate LVH. The estimated ejection fraction was 60%. Wall motion was normal; there were no regional wall motion abnormalities. Findings consistent with left ventricular diastolic dysfunction. - Right ventricle: The cavity size was mildly dilated. Systolic function was mildly reduced. - Right atrium: The atrium was mildly dilated.  Tele: sinus rhythm, no significant arrhythmia  Assessment/Plan:  1.  Chest pain - appears noncardiac. Enzymes and EKG's negative. Echo shows mild RV dysfunction, but normal pulmonary pressures, normal LV function. ? Etiology of RV dysfunction - clinical presentation is not consistent with PE as heart rate within limits, no dyspnea, cardiac markers are normal. No ASA or heparin in light of concern over ulcer disease - planned endoscopy today. 2. Acute on chronic renal failure. BUN/creatinine improved today despite iodinated contrast yesterday. Suspect IV fluids have helped. Appreciate renal consult. Avoid nephrotoxins. 3. Diabetes - CBG's well contolled appreciate Dr Fransico Him management. 4. HTN - uncontrolled. Continue metoprolol and add amlodipine 5 mg daily. 5. Dispo - pending EGD resullts, trend of renal function. Will repeat BMET in am.  Tonny Bollman, M.D. 05/23/2011, 8:25 AM

## 2011-05-24 LAB — IRON AND TIBC
Saturation Ratios: 32 % (ref 20–55)
TIBC: 198 ug/dL — ABNORMAL LOW (ref 215–435)

## 2011-05-24 LAB — GLUCOSE, CAPILLARY
Glucose-Capillary: 73 mg/dL (ref 70–99)
Glucose-Capillary: 72 mg/dL (ref 70–99)
Glucose-Capillary: 89 mg/dL (ref 70–99)

## 2011-05-24 LAB — CBC
MCH: 28.6 pg (ref 26.0–34.0)
MCV: 88.2 fL (ref 78.0–100.0)
Platelets: 187 10*3/uL (ref 150–400)
RDW: 13.5 % (ref 11.5–15.5)
WBC: 8.6 10*3/uL (ref 4.0–10.5)

## 2011-05-24 LAB — RENAL FUNCTION PANEL
Albumin: 2.5 g/dL — ABNORMAL LOW (ref 3.5–5.2)
Calcium: 8.3 mg/dL — ABNORMAL LOW (ref 8.4–10.5)
Creatinine, Ser: 4.04 mg/dL — ABNORMAL HIGH (ref 0.50–1.35)
GFR calc non Af Amer: 16 mL/min — ABNORMAL LOW (ref >90)

## 2011-05-24 MED ORDER — SODIUM CHLORIDE 0.9 % IV SOLN
INTRAVENOUS | Status: DC
  Administered 2011-05-25: 09:00:00 via INTRAVENOUS

## 2011-05-24 MED ORDER — METOPROLOL TARTRATE 100 MG PO TABS
100.0000 mg | ORAL_TABLET | Freq: Two times a day (BID) | ORAL | Status: DC
  Administered 2011-05-24 – 2011-05-25 (×2): 100 mg via ORAL
  Filled 2011-05-24 (×3): qty 1

## 2011-05-24 NOTE — Consult Note (Addendum)
[facility] - CONSULT F/U NOTE [place] TEAM 1 - Stepdown/ICU TEAM  Subjective: 51 year old Caucasian male with a history of coronary disease, diabetes mellitus, diabetic nephropathy, diabetic neuropathy, history of colon cancer was initially brought in as Code STEMI with chest pain at rest.  Post cath, reported that he woke up with intractable nausea and vomiting, with epigastric abdominal pain and chest pain radiating to his throat, associated with burping and burning sensation. He also had bright red blood in his vomiting which subsequently became coffee-ground.  Pt reports that he just had an episode of vomiting up his meal.  He denies any bright red blood or coffee grounds in his vomitus.  He c/o ongoing odynophagia.  He denies sob, f/c, or cp.    Objective: Weight change: 3.2 kg (7 lb 0.9 oz)  Intake/Output Summary (Last 24 hours) at 05/24/11 1716 Last data filed at 05/24/11 1545  Gross per 24 hour  Intake   2985 ml  Output    300 ml  Net   2685 ml   Blood pressure 155/75, pulse 65, temperature 98.2 F (36.8 C), temperature source Oral, resp. rate 16, height 6' (1.829 m), weight 76.7 kg (169 lb 1.5 oz), SpO2 100.00%.  CBG (last 3)   Basename 05/24/11 1645 05/24/11 1143 05/24/11 0752  GLUCAP 70 72 106*   Physical Exam: General: No acute respiratory distress Lungs: Clear to auscultation bilaterally without wheezes or crackles Cardiovascular: Regular rate and rhythm without murmur gallop or rub  Abdomen: Nontender, nondistended, soft, bowel sounds positive, no rebound, no ascites, no appreciable mass Extremities: No significant cyanosis, clubbing, or edema bilateral lower extremities  Lab Results:  Basename 05/24/11 0500 05/23/11 0550 05/22/11 2114 05/22/11 1218  NA 138 139 140 --  K 3.8 4.3 5.9* --  CL 106 106 105 --  CO2 24 25 26  --  GLUCOSE 40* 74 191* --  BUN 39* 52* 56* --  CREATININE 4.04* 4.41* 4.79* --  CALCIUM 8.3* 8.2* 8.6 --  MG -- -- -- 1.9  PHOS  5.6* -- -- --    Basename 05/24/11 0500 05/22/11 1046  AST -- 13  ALT -- 19  ALKPHOS -- 145*  BILITOT -- 0.2*  PROT -- 6.0  ALBUMIN 2.5* 2.6*    Basename 05/24/11 0515 05/23/11 0550 05/22/11 1218  WBC 8.6 10.9* 13.9*  NEUTROABS -- -- 12.2*  HGB 11.9* 11.6* 12.8*  HCT 36.7* 35.6* 37.8*  MCV 88.2 87.9 86.3  PLT 187 179 191    Basename 05/22/11 2241 05/22/11 1746 05/22/11 1207  CKTOTAL 83 68 49  CKMB 3.1 3.0 2.7  CKMBINDEX -- -- --  TROPONINI <0.30 <0.30 <0.30    Basename 05/22/11 1046  HGBA1C 9.2*   Micro Results: Recent Results (from the past 240 hour(s))  MRSA PCR SCREENING     Status: Normal   Collection Time   05/22/11 12:19 PM      Component Value Range Status Comment   MRSA by PCR NEGATIVE  NEGATIVE  Final    Studies/Results: All recent x-ray/radiology reports have been reviewed in detail.   Medications: I have reviewed the patient's complete medication list.  Recommendations:  DM CBGs are well controlled at present - continue to monitor  Acute on chronic renal failure Nephrology has signed off having nothing further to add at this time  Chest pain - known hx of CAD As per primary service (Cards) - pt is now s/p cardiac cath - likely mostly GI in nature  Esophagitis  w/ hematemesis See recs as per GI - pt is now s/p EGD  Mild normocytic anemia Likely only a minimal component of acute blood loss, with primary factor being CKD - follow w/ hydration - Hgb stable at this time - Fe studies NOT c/w Fe deficiency  HTN BP is poorly controlled - I will adjust tx plan again and continue to follow   Dispo Pt appears stable enough to transfer to medical bed in my opinion - given that the patient's ongoing issues are primarily medical, I would be glad to assume the attending role for this patient if Cardiology so desires  Lonia Blood, MD [facility] Office  [phone] Pager [phone]  On-Call/Text Page:      [email or web address]      password  [facility]

## 2011-05-24 NOTE — Progress Notes (Signed)
Patient ID: Joe Vincent, male   DOB: Jun 17, 1960, 51 y.o.   MRN: [id number] Subjective: Still with chest pain yesterday.  Objective: Vital signs in last 24 hours: Temp:  [97.6 F (36.4 C)-98.7 F (37.1 C)] 97.6 F (36.4 C) (04/20 0753) Pulse Rate:  [61-71] 71  (04/19 2133) Resp:  [8-26] 16  (04/20 0753) BP: (125-174)/(63-98) 164/98 mmHg (04/20 0755) SpO2:  [94 %-100 %] 100 % (04/20 0753) FiO2 (%):  [100 %] 100 % (04/19 1429) Weight:  [76.7 kg (169 lb 1.5 oz)] 76.7 kg (169 lb 1.5 oz) (04/20 0500) Last BM Date: 05/24/11  Intake/Output from previous day: 04/19 0701 - 04/20 0700 In: 2760 [P.O.:360; I.V.:2400] Out: 250 [Stool:250] Intake/Output this shift: Total I/O In: 200 [I.V.:200] Out: -   General appearance: alert and no distress GI: soft, non-tender; bowel sounds normal; no masses,  no organomegaly  Lab Results:  [facility] 05/24/11 0515 05/23/11 0550 05/22/11 1218  WBC 8.6 10.9* 13.9*  HGB 11.9* 11.6* 12.8*  HCT 36.7* 35.6* 37.8*  PLT 187 179 191   BMET  Basename 05/24/11 0500 05/23/11 0550 05/22/11 2114  NA 138 139 140  K 3.8 4.3 5.9*  CL 106 106 105  CO2 24 25 26   GLUCOSE 40* 74 191*  BUN 39* 52* 56*  CREATININE 4.04* 4.41* 4.79*  CALCIUM 8.3* 8.2* 8.6   LFT  Basename 05/24/11 0500 05/22/11 1046  PROT -- 6.0  ALBUMIN 2.5* --  AST -- 13  ALT -- 19  ALKPHOS -- 145*  BILITOT -- 0.2*  BILIDIR -- --  IBILI -- --   PT/INR  Basename 05/22/11 1046  LABPROT 14.3  INR 1.09   Hepatitis Panel No results found for this basename: HEPBSAG,HCVAB,HEPAIGM,HEPBIGM in the last 72 hours C-Diff No results found for this basename: CDIFFTOX:3 in the last 72 hours Fecal Lactopherrin No results found for this basename: FECLLACTOFRN in the last 72 hours  Studies/Results: US Renal  05/23/2011  *RADIOLOGY REPORT*  Clinical Data: Acute renal failure and chronic kidney disease. Diabetic neuropathy.  RENAL/URINARY TRACT ULTRASOUND COMPLETE  Comparison:  Ultrasound dated  01/17/2010  Findings:  Right Kidney:  12.4 cm in length.  Echogenic renal parenchyma.  13 mm cyst on the lower pole months decreased in size since the prior exam.  Left Kidney:  11.9 cm in length.  Echogenic renal parenchyma.  No obstruction.  Bladder:  Normal.  IMPRESSION: No evidence of obstruction of either kidney.  Echogenic renal parenchyma is essentially unchanged.  Complex cystic lesion on the lower pole of the right kidney has diminished in size since the prior exam and probably represented a hemorrhagic or proteinaceous cyst.  Original Report Authenticated By: Gwynn Burly, M.D.   Portable Chest X-ray 1 View  05/22/2011  *RADIOLOGY REPORT*  Clinical Data: Chest pain, nausea.  PORTABLE CHEST - 1 VIEW  Comparison: None.  Findings: Heart and mediastinal contours are within normal limits. No focal opacities or effusions.  No acute bony abnormality.  IMPRESSION: No active cardiopulmonary disease.  Original Report Authenticated By: Cyndie Chime, M.D.    Medications:  Scheduled:   . amLODipine  10 mg Oral Daily  . atorvastatin  40 mg Oral q1800  . insulin aspart  0-9 Units Subcutaneous TID WC  . metoprolol tartrate  50 mg Oral BID  . oxyCODONE  30 mg Oral Q12H  . sodium chloride  3 mL Intravenous Q12H  . sucralfate  1 g Oral TID WC & HS  . DISCONTD: metoprolol tartrate  25 mg Oral BID   Continuous:   . sodium chloride 75 mL/hr at 05/24/11 0507  . pantoprozole (PROTONIX) infusion 8 mg/hr (05/24/11 0508)    Assessment/Plan: 1) LA Grade D esophagitis.   This will take some time to heal, but he should start to improve over the upcoming days.  Plan: 1) Continue with Protonix. 2) Sucralfate. 3) Carb modified diet.  LOS: 2 days   Taylour Lietzke D 05/24/2011, 9:48 AM

## 2011-05-24 NOTE — Progress Notes (Signed)
SUBJECTIVE:  EGD showed LA Grade D esophagitis now on PPI and sucralfate  OBJECTIVE:   Vitals:   Filed Vitals:   05/24/11 0753 05/24/11 0755 05/24/11 1037 05/24/11 1041  BP:  164/98 150/81 150/80  Pulse:    65  Temp: 97.6 F (36.4 C)     TempSrc: Oral     Resp: 16     Height:      Weight:      SpO2: 100%      I&O's:   Intake/Output Summary (Last 24 hours) at 05/24/11 1125 Last data filed at 05/24/11 0900  Gross per 24 hour  Intake   2860 ml  Output      0 ml  Net   2860 ml   TELEMETRY: Reviewed telemetry pt in NSR:     PHYSICAL EXAM General: Well developed, well nourished, in no acute distress Head: Eyes PERRLA, No xanthomas.   Normal cephalic and atramatic  Lungs:   Clear bilaterally to auscultation and percussion. Heart:   HRRR S1 S2 Pulses are 2+ & equal. Abdomen: Bowel sounds are positive, abdomen soft and non-tender without masses  Extremities:   No clubbing, cyanosis or edema.  DP +1 Neuro: Alert and oriented X 3. Psych:  Good affect, responds appropriately   LABS: Basic Metabolic Panel:  Basename 05/24/11 0500 05/23/11 0550 05/22/11 1218  NA 138 139 --  K 3.8 4.3 --  CL 106 106 --  CO2 24 25 --  GLUCOSE 40* 74 --  BUN 39* 52* --  CREATININE 4.04* 4.41* --  CALCIUM 8.3* 8.2* --  MG -- -- 1.9  PHOS 5.6* -- --   Liver Function Tests:  Basename 05/24/11 0500 05/22/11 1046  AST -- 13  ALT -- 19  ALKPHOS -- 145*  BILITOT -- 0.2*  PROT -- 6.0  ALBUMIN 2.5* 2.6*   No results found for this basename: LIPASE:2,AMYLASE:2 in the last 72 hours CBC:  Basename 05/24/11 0515 05/23/11 0550 05/22/11 1218  WBC 8.6 10.9* --  NEUTROABS -- -- 12.2*  HGB 11.9* 11.6* --  HCT 36.7* 35.6* --  MCV 88.2 87.9 --  PLT 187 179 --   Cardiac Enzymes:  Basename 05/22/11 2241 05/22/11 1746 05/22/11 1207  CKTOTAL 83 68 49  CKMB 3.1 3.0 2.7  CKMBINDEX -- -- --  TROPONINI <0.30 <0.30 <0.30   BNP: No components found with this basename: POCBNP:3 D-Dimer: No  results found for this basename: DDIMER:2 in the last 72 hours Hemoglobin A1C:  Basename 05/22/11 1046  HGBA1C 9.2*   Fasting Lipid Panel:  Basename 05/23/11 0550  CHOL 139  HDL 31*  LDLCALC 74  TRIG 161*  CHOLHDL 4.5  LDLDIRECT --   Thyroid Function Tests:  Basename 05/22/11 1218  TSH 1.238  T4TOTAL --  T3FREE --  THYROIDAB --   Anemia Panel: No results found for this basename: VITAMINB12,FOLATE,FERRITIN,TIBC,IRON,RETICCTPCT in the last 72 hours Coag Panel:   Lab Results  Component Value Date   INR 1.09 05/22/2011    RADIOLOGY: US Renal  05/23/2011  *RADIOLOGY REPORT*  Clinical Data: Acute renal failure and chronic kidney disease. Diabetic neuropathy.  RENAL/URINARY TRACT ULTRASOUND COMPLETE  Comparison:  Ultrasound dated 01/17/2010  Findings:  Right Kidney:  12.4 cm in length.  Echogenic renal parenchyma.  13 mm cyst on the lower pole months decreased in size since the prior exam.  Left Kidney:  11.9 cm in length.  Echogenic renal parenchyma.  No obstruction.  Bladder:  Normal.  IMPRESSION: No  evidence of obstruction of either kidney.  Echogenic renal parenchyma is essentially unchanged.  Complex cystic lesion on the lower pole of the right kidney has diminished in size since the prior exam and probably represented a hemorrhagic or proteinaceous cyst.  Original Report Authenticated By: Gwynn Burly, M.D.   Portable Chest X-ray 1 View  05/22/2011  *RADIOLOGY REPORT*  Clinical Data: Chest pain, nausea.  PORTABLE CHEST - 1 VIEW  Comparison: None.  Findings: Heart and mediastinal contours are within normal limits. No focal opacities or effusions.  No acute bony abnormality.  IMPRESSION: No active cardiopulmonary disease.  Original Report Authenticated By: Cyndie Chime, M.D.      ASSESSMENT:  1.  Chest pain noncardiac with mild nonobstructive LAD stenosis with severe branch vessel disease of D1, moderate left circ stenosis and mild nonobstructive disease of RCA 2.  LA  Grade D esophagitis on PPI and sucralfate 3.  CAD stable 4.  DM   PLAN:   1.  Continue beta blocker and statin 2.  No ASA for now due to esophagitis  Quintella Reichert, MD  05/24/2011  11:25 AM

## 2011-05-24 NOTE — Progress Notes (Signed)
Patient ID: Joe Vincent, male   DOB: 01/06/1961, 51 y.o.   MRN: [id number] S:feeling better O:BP 150/80  Pulse 65  Temp(Src) 97.9 F (36.6 C) (Oral)  Resp 18  Ht 6' (1.829 m)  Wt 76.7 kg (169 lb 1.5 oz)  BMI 22.93 kg/m2  SpO2 100%  Intake/Output Summary (Last 24 hours) at 05/24/11 1218 Last data filed at 05/24/11 0900  Gross per 24 hour  Intake   2860 ml  Output      0 ml  Net   2860 ml   Weight change: 3.2 kg (7 lb 0.9 oz) Gen:WD WN WM in NAD CVS:no rub Resp:CTA WUJ:[id number] Ext:no edema   Lab 05/24/11 0500 05/23/11 0550 05/22/11 2114 05/22/11 1218 05/22/11 1050 05/22/11 1046  NA 138 139 140 -- 138 136  K 3.8 4.3 5.9* -- 5.1 5.0  CL 106 106 105 -- 111 103  CO2 24 25 26  -- -- 18*  GLUCOSE 40* 74 191* -- 443* 418*  BUN 39* 52* 56* -- 51* 56*  CREATININE 4.04* 4.41* 4.79* 4.67* 4.80* 4.66*  ALB -- -- -- -- -- --  CALCIUM 8.3* 8.2* 8.6 -- -- 8.1*  PHOS 5.6* -- -- -- -- --  AST -- -- -- -- -- 13  ALT -- -- -- -- -- 19   Liver Function Tests:  Lab 05/24/11 0500 05/22/11 1046  AST -- 13  ALT -- 19  ALKPHOS -- 145*  BILITOT -- 0.2*  PROT -- 6.0  ALBUMIN 2.5* 2.6*   No results found for this basename: LIPASE:3,AMYLASE:3 in the last 168 hours No results found for this basename: AMMONIA:3 in the last 168 hours CBC:  Lab 05/24/11 0515 05/23/11 0550 05/22/11 1218 05/22/11 1046  WBC 8.6 10.9* 13.9* --  NEUTROABS -- -- 12.2* --  HGB 11.9* 11.6* 12.8* --  HCT 36.7* 35.6* 37.8* --  MCV 88.2 87.9 86.3 85.4  PLT 187 179 191 --   Cardiac Enzymes:  Lab 05/22/11 2241 05/22/11 1746 05/22/11 1207 05/22/11 1046  CKTOTAL 83 68 49 48  CKMB 3.1 3.0 2.7 2.9  CKMBINDEX -- -- -- --  TROPONINI <0.30 <0.30 <0.30 <0.30   CBG:  Lab 05/24/11 1143 05/24/11 0752 05/24/11 0028 05/23/11 2327 05/23/11 2251  GLUCAP 72 106* 73 68* 61*    Iron Studies: No results found for this basename: IRON,TIBC,TRANSFERRIN,FERRITIN in the last 72 hours Studies/Results: US Renal  05/23/2011   *RADIOLOGY REPORT*  Clinical Data: Acute renal failure and chronic kidney disease. Diabetic neuropathy.  RENAL/URINARY TRACT ULTRASOUND COMPLETE  Comparison:  Ultrasound dated 01/17/2010  Findings:  Right Kidney:  12.4 cm in length.  Echogenic renal parenchyma.  13 mm cyst on the lower pole months decreased in size since the prior exam.  Left Kidney:  11.9 cm in length.  Echogenic renal parenchyma.  No obstruction.  Bladder:  Normal.  IMPRESSION: No evidence of obstruction of either kidney.  Echogenic renal parenchyma is essentially unchanged.  Complex cystic lesion on the lower pole of the right kidney has diminished in size since the prior exam and probably represented a hemorrhagic or proteinaceous cyst.  Original Report Authenticated By: Gwynn Burly, M.D.   Portable Chest X-ray 1 View  05/22/2011  *RADIOLOGY REPORT*  Clinical Data: Chest pain, nausea.  PORTABLE CHEST - 1 VIEW  Comparison: None.  Findings: Heart and mediastinal contours are within normal limits. No focal opacities or effusions.  No acute bony abnormality.  IMPRESSION: No active cardiopulmonary disease.  Original Report  Authenticated By: Cyndie Chime, M.D.      . amLODipine  10 mg Oral Daily  . atorvastatin  40 mg Oral q1800  . insulin aspart  0-9 Units Subcutaneous TID WC  . metoprolol tartrate  50 mg Oral BID  . oxyCODONE  30 mg Oral Q12H  . sodium chloride  3 mL Intravenous Q12H  . sucralfate  1 g Oral TID WC & HS  . DISCONTD: metoprolol tartrate  25 mg Oral BID    BMET    Component Value Date/Time   NA 138 05/24/2011 0500   K 3.8 05/24/2011 0500   CL 106 05/24/2011 0500   CO2 24 05/24/2011 0500   GLUCOSE 40* 05/24/2011 0500   BUN 39* 05/24/2011 0500   CREATININE 4.04* 05/24/2011 0500   CALCIUM 8.3* 05/24/2011 0500   GFRNONAA 16* 05/24/2011 0500   GFRAA 18* 05/24/2011 0500   CBC    Component Value Date/Time   WBC 8.6 05/24/2011 0515   RBC 4.16* 05/24/2011 0515   HGB 11.9* 05/24/2011 0515   HCT 36.7* 05/24/2011 0515    PLT 187 05/24/2011 0515   MCV 88.2 05/24/2011 0515   MCH 28.6 05/24/2011 0515   MCHC 32.4 05/24/2011 0515   RDW 13.5 05/24/2011 0515   LYMPHSABS 1.2 05/22/2011 1218   MONOABS 0.5 05/22/2011 1218   EOSABS 0.0 05/22/2011 1218   BASOSABS 0.0 05/22/2011 1218    Assessment/Plan:  1. 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 AKI likely due to volume depletion given N/V PTA, however now at risk for CIN however creat has actually decreased with IVF's. Will need to follow for another 24 hours and if cont to improve will have pt f/u with his primary nephrologist, Dr. [name]. Ways to delay CKD include:  1. Tight BP control with goal <130/80 2. Tight glucose control with goal A1c <7% 3. Use of an ACE or ARB (hold for now due to AKI/CKD) 4. Avoid nephrotoxic agents such as NSAID's, Cox-II Inhibitors, and contrast dyes.  2. DM- poorly controlled, adjustments per primary svc 3. CP- Due to esophagitis/gastritis. On protonix.  negative cardiac cath, non cardiac. W/u per primary svc. Agree with GI workup in light of burning sensation accompanied with N/V 4. Anemia- normocytic likely due to CKD vs ABLA from PUD/gastritis. W/u underway. 5. HTN- poorly controlled increase amlodipine to 10mg  daily 6. disop- improving renal function.  Nothing further to add and will sign off.  Pt to follow up with Dr. Charlann Noss A

## 2011-05-25 LAB — GLUCOSE, CAPILLARY
Glucose-Capillary: 92 mg/dL (ref 70–99)
Glucose-Capillary: 104 mg/dL — ABNORMAL HIGH (ref 70–99)

## 2011-05-25 LAB — BASIC METABOLIC PANEL
Calcium: 7.9 mg/dL — ABNORMAL LOW (ref 8.4–10.5)
GFR calc Af Amer: 20 mL/min — ABNORMAL LOW (ref >90)
GFR calc non Af Amer: 17 mL/min — ABNORMAL LOW (ref >90)
Potassium: 4.8 mEq/L (ref 3.5–5.1)
Sodium: 138 mEq/L (ref 135–145)

## 2011-05-25 LAB — CBC
Hemoglobin: 11.9 g/dL — ABNORMAL LOW (ref 13.0–17.0)
MCH: 28.8 pg (ref 26.0–34.0)
MCHC: 32.9 g/dL (ref 30.0–36.0)
Platelets: 147 10*3/uL — ABNORMAL LOW (ref 150–400)
RDW: 13.2 % (ref 11.5–15.5)

## 2011-05-25 MED ORDER — PANTOPRAZOLE SODIUM 40 MG PO TBEC
40.0000 mg | DELAYED_RELEASE_TABLET | Freq: Two times a day (BID) | ORAL | Status: DC
  Administered 2011-05-25 – 2011-05-26 (×3): 40 mg via ORAL
  Filled 2011-05-25 (×3): qty 1

## 2011-05-25 MED ORDER — METOPROLOL TARTRATE 100 MG PO TABS
200.0000 mg | ORAL_TABLET | Freq: Two times a day (BID) | ORAL | Status: DC
  Administered 2011-05-25 – 2011-05-26 (×2): 200 mg via ORAL
  Filled 2011-05-25 (×3): qty 2

## 2011-05-25 MED ORDER — GI COCKTAIL ~~LOC~~
30.0000 mL | Freq: Three times a day (TID) | ORAL | Status: DC | PRN
  Administered 2011-05-25 – 2011-05-26 (×3): 30 mL via ORAL
  Filled 2011-05-25 (×3): qty 30

## 2011-05-25 NOTE — Consult Note (Signed)
[facility] - CONSULT F/U NOTE [place] TEAM 1 - Stepdown/ICU TEAM  Subjective: 51 year old Caucasian male with a history of coronary disease, diabetes mellitus, diabetic nephropathy, diabetic neuropathy, history of colon cancer was initially brought in as Code STEMI with chest pain at rest.  Post cath, reported that he woke up with intractable nausea and vomiting, with epigastric abdominal pain and chest pain radiating to his throat, associated with burping and burning sensation. He also had bright red blood in his vomiting which subsequently became coffee-ground.  Pt is now able to tolerate soft foods, though firm foods lead to rapid regurgitation.  He feels that his pain is slightly improved.  He denies new complaints.      Objective: Weight change: -1 kg (-2 lb 3.3 oz)  Intake/Output Summary (Last 24 hours) at 05/25/11 1422 Last data filed at 05/25/11 1000  Gross per 24 hour  Intake   2180 ml  Output   1800 ml  Net    380 ml   Blood pressure 173/85, pulse 68, temperature 98.6 F (37 C), temperature source Oral, resp. rate 18, height 6' (1.829 m), weight 75.7 kg (166 lb 14.2 oz), SpO2 99.00%.  CBG (last 3)   Basename 05/25/11 1142 05/25/11 0753 05/24/11 2209  GLUCAP 104* 92 89   Physical Exam: General: No acute respiratory distress Lungs: Clear to auscultation bilaterally without wheezes or crackles Cardiovascular: Regular rate and rhythm without murmur gallop or rub  Abdomen: NT/ND, soft, bowel sounds positive, no rebound, no ascites, no appreciable mass Extremities: No significant cyanosis, clubbing, or edema bilateral lower extremities  Lab Results:  Basename 05/25/11 0730 05/24/11 0500 05/23/11 0550  NA 138 138 139  K 4.8 3.8 4.3  CL 108 106 106  CO2 21 24 25   GLUCOSE 93 40* 74  BUN 31* 39* 52*  CREATININE 3.77* 4.04* 4.41*  CALCIUM 7.9* 8.3* 8.2*  MG -- -- --  PHOS -- 5.6* --    Basename 05/24/11 0500  AST --  ALT --  ALKPHOS --  BILITOT --  PROT  --  ALBUMIN 2.5*    Basename 05/25/11 0730 05/24/11 0515 05/23/11 0550  WBC 5.5 8.6 10.9*  NEUTROABS -- -- --  HGB 11.9* 11.9* 11.6*  HCT 36.2* 36.7* 35.6*  MCV 87.7 88.2 87.9  PLT 147* 187 179    Basename 05/22/11 2241 05/22/11 1746  CKTOTAL 83 68  CKMB 3.1 3.0  CKMBINDEX -- --  TROPONINI <0.30 <0.30   Micro Results: Recent Results (from the past 240 hour(s))  MRSA PCR SCREENING     Status: Normal   Collection Time   05/22/11 12:19 PM      Component Value Range Status Comment   MRSA by PCR NEGATIVE  NEGATIVE  Final    Studies/Results: All recent x-ray/radiology reports have been reviewed in detail.   Medications: I have reviewed the patient's complete medication list.  Recommendations:  DM CBGs are well controlled at present - continue to monitor w/o change today  Acute on chronic renal failure Nephrology has signed off having nothing further to add at this time  Chest pain - known hx of CAD As per primary service (Cards) - pt is now s/p cardiac cath - likely mostly GI in nature  Esophagitis w/ hematemesis See recs as per GI - pt is now s/p EGD - is tolerating soft foods - likely will be ready for D/C on modified diet in morning  Mild normocytic anemia Likely only a minimal component of  acute blood loss, with primary factor being CKD - Hgb stable at this time - Fe studies NOT c/w Fe deficiency  HTN BP is poorly controlled - I will adjust tx plan again today and continue to follow   Dispo Will continue to follow along with you - will likely be ready for d/c Monday morning (at discretion of primary service)  Lonia Blood, MD [facility] Office  [phone] Pager [phone]  On-Call/Text Page:      [email or web address]      password [email or web address]

## 2011-05-25 NOTE — Progress Notes (Signed)
SUBJECTIVE:  Still complains of constant chest pain OBJECTIVE:   Vitals:   Filed Vitals:   05/25/11 0751 05/25/11 0755 05/25/11 0931 05/25/11 0933  BP:  152/84 150/74 150/74  Pulse:    68  Temp: 98.5 F (36.9 C)     TempSrc: Oral     Resp: 20     Height:      Weight:      SpO2: 99%      I&O's:   Intake/Output Summary (Last 24 hours) at 05/25/11 1101 Last data filed at 05/25/11 1000  Gross per 24 hour  Intake   2375 ml  Output   1800 ml  Net    575 ml   TELEMETRY: Reviewed telemetry pt in NSR     PHYSICAL EXAM General: Well developed, well nourished, in no acute distress Head: Eyes PERRLA, No xanthomas.   Normal cephalic and atramatic  Lungs:   Clear bilaterally to auscultation and percussion. Heart:   HRRR S1 S2 Pulses are 2+ & equal. Abdomen: Bowel sounds are positive, abdomen soft and non-tender without masses  Extremities:   No clubbing, cyanosis or edema.  DP +1 Neuro: Alert and oriented X 3. Psych:  Good affect, responds appropriately   LABS: Basic Metabolic Panel:  Basename 05/25/11 0730 05/24/11 0500 05/22/11 1218  NA 138 138 --  K 4.8 3.8 --  CL 108 106 --  CO2 21 24 --  GLUCOSE 93 40* --  BUN 31* 39* --  CREATININE 3.77* 4.04* --  CALCIUM 7.9* 8.3* --  MG -- -- 1.9  PHOS -- 5.6* --   Liver Function Tests:  Basename 05/24/11 0500  AST --  ALT --  ALKPHOS --  BILITOT --  PROT --  ALBUMIN 2.5*   No results found for this basename: LIPASE:2,AMYLASE:2 in the last 72 hours CBC:  Basename 05/25/11 0730 05/24/11 0515 05/22/11 1218  WBC 5.5 8.6 --  NEUTROABS -- -- 12.2*  HGB 11.9* 11.9* --  HCT 36.2* 36.7* --  MCV 87.7 88.2 --  PLT 147* 187 --   Cardiac Enzymes:  Basename 05/22/11 2241 05/22/11 1746 05/22/11 1207  CKTOTAL 83 68 49  CKMB 3.1 3.0 2.7  CKMBINDEX -- -- --  TROPONINI <0.30 <0.30 <0.30   BNP: No components found with this basename: POCBNP:3 D-Dimer: No results found for this basename: DDIMER:2 in the last 72  hours Hemoglobin A1C: No results found for this basename: HGBA1C in the last 72 hours Fasting Lipid Panel:  Basename 05/23/11 0550  CHOL 139  HDL 31*  LDLCALC 74  TRIG 413*  CHOLHDL 4.5  LDLDIRECT --   Thyroid Function Tests:  Basename 05/22/11 1218  TSH 1.238  T4TOTAL --  T3FREE --  THYROIDAB --   Anemia Panel:  Basename 05/24/11 0515  VITAMINB12 --  FOLATE --  FERRITIN 128  TIBC 198*  IRON 63  RETICCTPCT --   Coag Panel:   Lab Results  Component Value Date   INR 1.09 05/22/2011    RADIOLOGY: US Renal  05/23/2011  *RADIOLOGY REPORT*  Clinical Data: Acute renal failure and chronic kidney disease. Diabetic neuropathy.  RENAL/URINARY TRACT ULTRASOUND COMPLETE  Comparison:  Ultrasound dated 01/17/2010  Findings:  Right Kidney:  12.4 cm in length.  Echogenic renal parenchyma.  13 mm cyst on the lower pole months decreased in size since the prior exam.  Left Kidney:  11.9 cm in length.  Echogenic renal parenchyma.  No obstruction.  Bladder:  Normal.  IMPRESSION: No evidence  of obstruction of either kidney.  Echogenic renal parenchyma is essentially unchanged.  Complex cystic lesion on the lower pole of the right kidney has diminished in size since the prior exam and probably represented a hemorrhagic or proteinaceous cyst.  Original Report Authenticated By: Gwynn Burly, M.D.   Portable Chest X-ray 1 View  05/22/2011  *RADIOLOGY REPORT*  Clinical Data: Chest pain, nausea.  PORTABLE CHEST - 1 VIEW  Comparison: None.  Findings: Heart and mediastinal contours are within normal limits. No focal opacities or effusions.  No acute bony abnormality.  IMPRESSION: No active cardiopulmonary disease.  Original Report Authenticated By: Cyndie Chime, M.D.  ASSESSMENT:  1. Chest pain noncardiac with mild nonobstructive LAD stenosis with severe branch vessel disease of D1, moderate left circ stenosis and mild nonobstructive disease of RCA by cath this admit 2. LA Grade D esophagitis on  PPI and sucralfate  3. CAD stable  4. DM   PLAN:   1. Continue beta blocker and statin  2. No ASA for now due to esophagitis      Quintella Reichert, MD  05/25/2011  11:01 AM

## 2011-05-25 NOTE — Progress Notes (Signed)
Report called to the receiving RN on Unit 2000. Pt was made aware of transfer. We will transfer pt as soon as the transfer orders are complete.

## 2011-05-25 NOTE — Progress Notes (Signed)
Patient ID: Joe Vincent, male   DOB: [birth year], 51 y.o.   MRN: [id number] Subjective: Still with chest pain and vomiting with any significant PO intake.  Objective: Vital signs in last 24 hours: Temp:  [97.9 F (36.6 C)-98.6 F (37 C)] 98.5 F (36.9 C) (04/21 0751) Pulse Rate:  [65] 65  (04/20 1041) Resp:  [16-20] 20  (04/21 0751) BP: (132-160)/(57-86) 137/67 mmHg (04/21 0500) SpO2:  [99 %-100 %] 99 % (04/21 0751) Weight:  [75.7 kg (166 lb 14.2 oz)] 75.7 kg (166 lb 14.2 oz) (04/21 0500) Last BM Date: 05/24/11  Intake/Output from previous day: 04/20 0701 - 04/21 0700 In: 2565 [P.O.:840; I.V.:1725] Out: 1600 [Urine:1600] Intake/Output this shift:    General appearance: alert and no distress GI: soft, non-tender; bowel sounds normal; no masses,  no organomegaly  Lab Results:  Basename 05/24/11 0515 05/23/11 0550 05/22/11 1218  WBC 8.6 10.9* 13.9*  HGB 11.9* 11.6* 12.8*  HCT 36.7* 35.6* 37.8*  PLT 187 179 191   BMET  Basename 05/24/11 0500 05/23/11 0550 05/22/11 2114  NA 138 139 140  K 3.8 4.3 5.9*  CL 106 106 105  CO2 24 25 26   GLUCOSE 40* 74 191*  BUN 39* 52* 56*  CREATININE 4.04* 4.41* 4.79*  CALCIUM 8.3* 8.2* 8.6   LFT  Basename 05/24/11 0500 05/22/11 1046  PROT -- 6.0  ALBUMIN 2.5* --  AST -- 13  ALT -- 19  ALKPHOS -- 145*  BILITOT -- 0.2*  BILIDIR -- --  IBILI -- --   PT/INR  Basename 05/22/11 1046  LABPROT 14.3  INR 1.09   Hepatitis Panel No results found for this basename: HEPBSAG,HCVAB,HEPAIGM,HEPBIGM in the last 72 hours C-Diff No results found for this basename: CDIFFTOX:3 in the last 72 hours Fecal Lactopherrin No results found for this basename: FECLLACTOFRN in the last 72 hours  Studies/Results: US Renal  05/23/2011  *RADIOLOGY REPORT*  Clinical Data: Acute renal failure and chronic kidney disease. Diabetic neuropathy.  RENAL/URINARY TRACT ULTRASOUND COMPLETE  Comparison:  Ultrasound dated 01/17/2010  Findings:  Right Kidney:  12.4 cm  in length.  Echogenic renal parenchyma.  13 mm cyst on the lower pole months decreased in size since the prior exam.  Left Kidney:  11.9 cm in length.  Echogenic renal parenchyma.  No obstruction.  Bladder:  Normal.  IMPRESSION: No evidence of obstruction of either kidney.  Echogenic renal parenchyma is essentially unchanged.  Complex cystic lesion on the lower pole of the right kidney has diminished in size since the prior exam and probably represented a hemorrhagic or proteinaceous cyst.  Original Report Authenticated By: Gwynn Burly, M.D.    Medications:  Scheduled:   . amLODipine  10 mg Oral Daily  . atorvastatin  40 mg Oral q1800  . insulin aspart  0-9 Units Subcutaneous TID WC  . metoprolol tartrate  100 mg Oral BID  . sodium chloride  3 mL Intravenous Q12H  . sucralfate  1 g Oral TID WC & HS  . DISCONTD: metoprolol tartrate  50 mg Oral BID  . DISCONTD: oxyCODONE  30 mg Oral Q12H   Continuous:   . sodium chloride    . pantoprozole (PROTONIX) infusion 8 mg/hr (05/25/11 0014)  . DISCONTD: sodium chloride 75 mL/hr at 05/24/11 1800    Assessment/Plan: 1) LA Grade D esophagitis. 2) Chest pain secondary to esophagitis.   Unfortunately the patient is progressing very slowly.    Plan: 1) Continue with Protonix BID. 2) Sucralfate QID.  LOS: 3 days   Mehran Guderian D 05/25/2011, 8:00 AM

## 2011-05-26 ENCOUNTER — Encounter (HOSPITAL_COMMUNITY): Payer: Self-pay | Admitting: Gastroenterology

## 2011-05-26 DIAGNOSIS — I2510 Atherosclerotic heart disease of native coronary artery without angina pectoris: Secondary | ICD-10-CM

## 2011-05-26 DIAGNOSIS — K209 Esophagitis, unspecified without bleeding: Secondary | ICD-10-CM

## 2011-05-26 DIAGNOSIS — K2970 Gastritis, unspecified, without bleeding: Secondary | ICD-10-CM

## 2011-05-26 LAB — BASIC METABOLIC PANEL
BUN: 34 mg/dL — ABNORMAL HIGH (ref 6–23)
CO2: 17 mEq/L — ABNORMAL LOW (ref 19–32)
Calcium: 8 mg/dL — ABNORMAL LOW (ref 8.4–10.5)
GFR calc non Af Amer: 16 mL/min — ABNORMAL LOW (ref >90)
Glucose, Bld: 214 mg/dL — ABNORMAL HIGH (ref 70–99)

## 2011-05-26 LAB — GLUCOSE, CAPILLARY: Glucose-Capillary: 206 mg/dL — ABNORMAL HIGH (ref 70–99)

## 2011-05-26 MED ORDER — ATORVASTATIN CALCIUM 40 MG PO TABS: 40.0000 mg | ORAL_TABLET | Freq: Every day | ORAL | Status: DC

## 2011-05-26 MED ORDER — SUCRALFATE 1 GM/10ML PO SUSP: 1.0000 g | Freq: Three times a day (TID) | ORAL | Status: DC

## 2011-05-26 MED ORDER — METOPROLOL TARTRATE 100 MG PO TABS: 100.0000 mg | ORAL_TABLET | Freq: Two times a day (BID) | ORAL | Status: DC

## 2011-05-26 MED ORDER — INSULIN GLARGINE 100 UNIT/ML ~~LOC~~ SOLN: 10.0000 [IU] | Freq: Every day | SUBCUTANEOUS | Status: DC

## 2011-05-26 MED ORDER — PANTOPRAZOLE SODIUM 40 MG PO TBEC: 40.0000 mg | DELAYED_RELEASE_TABLET | Freq: Two times a day (BID) | ORAL | Status: DC

## 2011-05-26 NOTE — Progress Notes (Signed)
Pt was a smoker of 1 ppd but quit smoking cigarettes on January 1st of 2013, however he's switched to cigars and smokes 2/day. Discussed risk factors of cigar smoking and its effects on his health. Recommended the patch to pt and 14 mg  to start with. Discussed patch use directions and how to taper. Referred to 1-800 quit now for f/u and support. Discussed oral fixation substitutes, second hand smoke and in home smoking policy. Reviewed and gave pt Written education/contact information.

## 2011-05-26 NOTE — Progress Notes (Addendum)
Subjective: Patient seen and examined, continued to complain of chest pain/heartburn worsens with food.  Objective: Vital signs in last 24 hours: Temp:  [97.2 F (36.2 C)-98.5 F (36.9 C)] 97.8 F (36.6 C) (04/22 1346) Pulse Rate:  [61-72] 72  (04/22 1346) Resp:  [18-20] 18  (04/22 1346) BP: (136-161)/(71-88) 137/73 mmHg (04/22 1346) SpO2:  [98 %-100 %] 98 % (04/22 1346) Weight:  [75.751 kg (167 lb)] 75.751 kg (167 lb) (04/22 0657) Weight change: 0.051 kg (1.8 oz) Last BM Date: 05/25/11  Intake/Output from previous day: 04/21 0701 - 04/22 0700 In: 1430 [P.O.:730; I.V.:700] Out: 600 [Urine:600] Total I/O In: 423 [P.O.:420; I.V.:3] Out: 250 [Urine:250]   Physical Exam: General: Alert, awake, oriented x3, in no acute distress. HEENT: No bruits, no goiter. Heart: Regular rate and rhythm, without murmurs, rubs, gallops. Lungs: Clear to auscultation bilaterally. Abdomen: Soft, nontender, nondistended, positive bowel sounds.ileostomy in place Extremities: No clubbing cyanosis or edema with positive pedal pulses. Neuro: Grossly intact, nonfocal.    Lab Results: Results for orders placed during the hospital encounter of 05/22/11 (from the past 24 hour(s))  GLUCOSE, CAPILLARY     Status: Abnormal   Collection Time   05/25/11  4:55 PM      Component Value Range   Glucose-Capillary 155 (*) 70 - 99 (mg/dL)   Comment 1 Notify RN    GLUCOSE, CAPILLARY     Status: Abnormal   Collection Time   05/25/11 10:00 PM      Component Value Range   Glucose-Capillary 216 (*) 70 - 99 (mg/dL)  BASIC METABOLIC PANEL     Status: Abnormal   Collection Time   05/26/11  7:00 AM      Component Value Range   Sodium 135  135 - 145 (mEq/L)   Potassium 5.3 (*) 3.5 - 5.1 (mEq/L)   Chloride 106  96 - 112 (mEq/L)   CO2 17 (*) 19 - 32 (mEq/L)   Glucose, Bld 214 (*) 70 - 99 (mg/dL)   BUN 34 (*) 6 - 23 (mg/dL)   Creatinine, Ser 8.11 (*) 0.50 - 1.35 (mg/dL)   Calcium 8.0 (*) 8.4 - 10.5 (mg/dL)   GFR  calc non Af Amer 16 (*) >90 (mL/min)   GFR calc Af Amer 19 (*) >90 (mL/min)  GLUCOSE, CAPILLARY     Status: Abnormal   Collection Time   05/26/11  7:59 AM      Component Value Range   Glucose-Capillary 206 (*) 70 - 99 (mg/dL)  GLUCOSE, CAPILLARY     Status: Abnormal   Collection Time   05/26/11 11:31 AM      Component Value Range   Glucose-Capillary 176 (*) 70 - 99 (mg/dL)    Studies/Results: No results found.  Medications:    . amLODipine  10 mg Oral Daily  . atorvastatin  40 mg Oral q1800  . insulin aspart  0-9 Units Subcutaneous TID WC  . metoprolol tartrate  200 mg Oral BID  . pantoprazole  40 mg Oral BID AC  . sodium chloride  3 mL Intravenous Q12H  . sucralfate  1 g Oral TID WC & HS    sodium chloride, acetaminophen, ALPRAZolam, dextrose, dextrose, gi cocktail, HYDROmorphone (DILAUDID) injection, nitroGLYCERIN, ondansetron (ZOFRAN) IV, oxyCODONE, sodium chloride, zolpidem     Assessment/Plan: DM  CBGs are increasing with advancement of diet , patient is currently only on SSI. He was on Lantus 60 units at home. Start with 10 units q hs and adjust Lantus dose  as per CBGs trend . Acute on chronic renal failure  Nephrology has signed off .follow with primary nephrologist as  Outpatient.mild hypokalemia noted, advise low  potassium diet, monitor potassium level closely Chest pain - known hx of CAD  As per primary service (Cards) - pt is now s/p cardiac cath - likely mostly GI in nature  Esophagitis w/ hematemesis  s/p EGD -continue PPI. Mild normocytic anemia  Likely secondary to CKD - Hgb stable at this time - Fe studies NOT c/w Fe deficiency  HTN  Improved. Dispo  As  Per  primary team       LOS: 4 days   Crimson Beer 05/26/2011, 2:57 PM

## 2011-05-26 NOTE — Discharge Instructions (Signed)
***  PLEASE REMEMBER TO BRING ALL OF YOUR MEDICATIONS TO EACH OF YOUR FOLLOW-UP OFFICE VISITS.  NO HEAVY LIFTING OR SEXUAL ACTIVITY X 7 DAYS. NO DRIVING X 2-3 DAYS. NO SOAKING BATHS, HOT TUBS, POOLS, ETC., X 7 DAYS.  

## 2011-05-26 NOTE — Progress Notes (Signed)
Patient ID: Joe Vincent, male   DOB: [birth year], 51 y.o.   MRN: [id number] Subjective: Still with chest pain- 5/10, which is much improved than before. Is able to tolerate diet with minimal pain. Objective: Vital signs in last 24 hours: Temp:  [97.2 F (36.2 C)-98.5 F (36.9 C)] 97.8 F (36.6 C) (04/22 1346) Pulse Rate:  [61-72] 72  (04/22 1346) Resp:  [18-20] 18  (04/22 1346) BP: (136-161)/(71-88) 137/73 mmHg (04/22 1346) SpO2:  [98 %-100 %] 98 % (04/22 1346) Weight:  [75.751 kg (167 lb)] 75.751 kg (167 lb) (04/22 0657) Last BM Date: 05/25/11 Intake/Output from previous day: 04/21 0701 - 04/22 0700 In: 1430 [P.O.:730; I.V.:700] Out: 600 [Urine:600] Intake/Output this shift: Total I/O In: 423 [P.O.:420; I.V.:3] Out: 250 [Urine:250] General appearance: alert and no distress GI: soft, non-tender; bowel sounds normal; no masses,  no organomegaly Lab Results:  Basename 05/25/11 0730 05/24/11 0515  WBC 5.5 8.6  HGB 11.9* 11.9*  HCT 36.2* 36.7*  PLT 147* 187   BMET  Basename 05/26/11 0700 05/25/11 0730 05/24/11 0500  NA 135 138 138  K 5.3* 4.8 3.8  CL 106 108 106  CO2 17* 21 24  GLUCOSE 214* 93 40*  BUN 34* 31* 39*  CREATININE 4.00* 3.77* 4.04*  CALCIUM 8.0* 7.9* 8.3*   LFT  Basename 05/24/11 0500  PROT --  ALBUMIN 2.5*  AST --  ALT --  ALKPHOS --  BILITOT --  BILIDIR --  IBILI --  Medications:  Scheduled:    . amLODipine  10 mg Oral Daily  . atorvastatin  40 mg Oral q1800  . insulin aspart  0-9 Units Subcutaneous TID WC  . metoprolol tartrate  200 mg Oral BID  . pantoprazole  40 mg Oral BID AC  . sodium chloride  3 mL Intravenous Q12H  . sucralfate  1 g Oral TID WC & HS   Continuous:   Assessment/Plan: 1) LA Grade D esophagitis. 2) Chest pain secondary to esophagitis.  The patient is progressing slowly. He is though much better. If he tolerates diet today- will be ready to D/C home with out-pt f/u with Dr. Elnoria Howard Agree with above plans. Plan: 1)  Continue with Protonix BID. 2) Sucralfate QID.   LOS: 4 days   PATEL,RAVI 05/26/2011, 2:38 PM

## 2011-05-26 NOTE — Progress Notes (Signed)
    Subjective:  Still complains of 5/10 chest pain unchanged over several days. He says 'I feel 50% better.' No dyspnea. Vomiting last night, no nausea this am.  Objective:  Vital Signs in the last 24 hours: Temp:  [97.2 F (36.2 C)-98.6 F (37 C)] 97.6 F (36.4 C) (04/22 0713) Pulse Rate:  [61-68] 61  (04/22 0713) Resp:  [18-20] 20  (04/22 0713) BP: (136-173)/(71-88) 145/72 mmHg (04/22 0713) SpO2:  [98 %-100 %] 98 % (04/22 0713) Weight:  [75.751 kg (167 lb)] 75.751 kg (167 lb) (04/22 0657)  Intake/Output from previous day: 04/21 0701 - 04/22 0700 In: 1430 [P.O.:730; I.V.:700] Out: 600 [Urine:600]  Physical Exam: Pt is alert and oriented, NAD HEENT: normal Neck: JVP - normal Lungs: CTA bilaterally CV: RRR without murmur or gallop Abd: soft, NT, Positive BS, no hepatomegaly Ext: no C/C/E, distal pulses intact and equal Skin: warm/dry no rash  Lab Results:  Basename 05/25/11 0730 05/24/11 0515  WBC 5.5 8.6  HGB 11.9* 11.9*  PLT 147* 187    Basename 05/26/11 0700 05/25/11 0730  NA 135 138  K 5.3* 4.8  CL 106 108  CO2 17* 21  GLUCOSE 214* 93  BUN 34* 31*  CREATININE 4.00* 3.77*   No results found for this basename: TROPONINI:2,CK,MB:2 in the last 72 hours  Tele: sinus rhythm  Assessment/Plan:  1. Noncardiac chest pain 2. Esophagitis 3. HTN - suboptimal but improved control on amlodipine and metoprolol 4. Advanced CKD - essentially stable 5. Hyperkalemia 6. Type 2 diabetes  Dispo - stable from cardiac perspective. Will await final GI recs regarding diet and when it is ok to discharge. Overall he seems ready to go home if PO intake is adequate. Appreciate help of Dr Elnoria Howard and Dr Sharon Seller.  Tonny Bollman, M.D. 05/26/2011, 8:39 AM

## 2011-05-26 NOTE — Discharge Summary (Signed)
Patient ID: Joe Vincent,  MRN: [id number], DOB/AGE: 51-11-1960 51 y.o.  Admit date: 05/22/2011 Discharge date: 05/26/2011  Primary Care Provider: Dr. Cato Mulligan [facility] Primary Cardiologist: Judie Petit. Excell Seltzer, MD  Discharge Diagnoses Principal Problem:  *Chest pain at rest Active Problems:  Esophagitis - by EGD this admission.  Gastritis  Diabetes mellitus  Hypertension  CKD (chronic kidney disease) stage 3, GFR 30-59 ml/min  CAD (coronary artery disease) - predominantly nonobstructive and branch vessel dzs (diag) by cath this admission.   Allergies Allergies  Allergen Reactions  . Codeine     swelling    Procedures  2D Echocardiogram 05/22/2011 Study Conclusions  - Left ventricle: The cavity size was normal. Wall thickness   was increased in a pattern of moderate LVH. The estimated   ejection fraction was 60%. Wall motion was normal; there   were no regional wall motion abnormalities. Findings   consistent with left ventricular diastolic dysfunction. - Right ventricle: The cavity size was mildly dilated.   Systolic function was mildly reduced. - Right atrium: The atrium was mildly dilated. Transthoracic echocardiography.  M-mode, complete 2D _____________  Cardiac Catheterization 05/22/2011  Hemodynamics: AO 125/76 LV 135/12    Left mainstem: The left main is patent. There is minor irregularity but no high-grade stenosis is present.   Left anterior descending (LAD): The LAD is a large caliber vessel that reaches the left ventricular apex. There is mild nonobstructive plaque in the proximal vessel. The mid vessel also has mild disease. The vessel extends down and wraps around the left ventricular apex. The first diagonal branch provides and twin vessels and at the bifurcation point there is severe 80-90% stenosis of the diagonal branch.   Left circumflex (LCx): The circumflex is also a large caliber vessel. There is a moderate-sized first OM with 40-50% proximal stenosis. The  second OM device and twin vessels and also has 50% proximal stenosis. There is no high-grade disease in the left circumflex.   Right coronary artery (RCA): The RCA is dominant. There is no significant obstructive disease throughout. It gives off an acute marginal branch, a PDA branch, and a small posterolateral branch. There is mild diffuse irregularity but no significant stenosis.   Aortic root angiography: The aortic root and ascending aorta appear angiographically normal. There is no significant dilatation. There is no evidence of aortic dissection. The aortic valve appears competent with no aortic insufficiency. There is normal distribution of the head and neck vessels.   _____________  EGD 05/23/2011  LA Grade D Esophagitis Fundle Gastritis  Rec Cont protonix & add sucralfate. _____________  History of Present Illness  51 y/o male with the above problem list.  He was in his usoh until 2 AM on the day of admission when he awoke with chest pain associated with nausea, diaphoresis, & vomiting.  He called EMS and was treated with ASA and NTG with minimal relief of chest pain.  CBG was found to be >400.  He was taken to [facility] ED for eval.  In the ED, he cont to c/o chest pain.  ECG showed no acute changes.  Because of ongoing Ss and concern for cardiac angina, he was taken urgently to the cath lab for further evaluation.  Hospital Course  Pt underwent diagnostic catheterization revealing significant disease within a small caliber first diagonal and otherwise nonobstructive dzs.  It was not felt that this was the cause of the patients Ss.  He was transferred to step down where he ruled out for  MI.  He was noted to have an elevated creatinine of 4.79, with history of Stage III CKD.  He was seen by nephrology in the post-cath setting and creat has remained stable (4.0 today).  He follows up with Dr. [name] in [place].  Pt continued to have chest pain along with nausea  & vomiting and was seen  by gastroenterology.  EGD was carried out on 4/19 revealing esophagitis and gastritis as outlined above.  Pt was maintained on PPI and sucralfate therapy with some improvement in chest pain, n, v.  He will continue to f/u with Dr. Elnoria Howard as an outpt.  He will f/u with his PCP for a bmet later this week.  Discharge Vitals Blood pressure 137/73, pulse 72, temperature 97.8 F (36.6 C), temperature source Oral, resp. rate 18, height 6' (1.829 m), weight 167 lb (75.751 kg), SpO2 98.00%.  Filed Weights   05/24/11 0500 05/25/11 0500 05/26/11 0657  Weight: 169 lb 1.5 oz (76.7 kg) 166 lb 14.2 oz (75.7 kg) 167 lb (75.751 kg)    Labs  CBC  Basename 05/25/11 0730 05/24/11 0515  WBC 5.5 8.6  NEUTROABS -- --  HGB 11.9* 11.9*  HCT 36.2* 36.7*  MCV 87.7 88.2  PLT 147* 187   Basic Metabolic Panel  Basename 05/26/11 0700 05/25/11 0730 05/24/11 0500  NA 135 138 --  K 5.3* 4.8 --  CL 106 108 --  CO2 17* 21 --  GLUCOSE 214* 93 --  BUN 34* 31* --  CREATININE 4.00* 3.77* --  CALCIUM 8.0* 7.9* --  MG -- -- --  PHOS -- -- 5.6*   Liver Function Tests  Basename 05/24/11 0500  AST --  ALT --  ALKPHOS --  BILITOT --  PROT --  ALBUMIN 2.5*   Disposition  Pt is being discharged home today in good condition.  Follow-up Plans & Appointments  Follow-up Information    Follow up with Theda Belfast, MD. (call for appt.)    Contact information:   [address] [place] [address] [phone]       Follow up with [name], MD in 1 week. (for blood chemistry)    Contact information:   [address] [place] [address] [phone]       Follow up with [facility], MD. (as scheduled.)    Contact information:   [address] [place] [address] [phone]          Discharge Medications  Medication List  As of 05/26/2011  7:11 PM   TAKE these medications         amLODipine 10 MG tablet   Commonly  known as: NORVASC   Take 10 mg by mouth daily.      atorvastatin 40 MG tablet   Commonly known as: LIPITOR   Take 1 tablet (40 mg total) by mouth daily at 6 PM.      HYDROcodone-acetaminophen 10-650 MG per tablet   Commonly known as: LORCET   Take 1 tablet by mouth 2 (two) times daily. Takes 2 everyday per patient      insulin glargine 100 UNIT/ML injection   Commonly known as: LANTUS   Inject 60 Units into the skin at bedtime.      metoprolol 100 MG tablet   Commonly known as: LOPRESSOR   Take 1 tablet (100 mg total) by mouth 2 (two) times daily.      oxycodone 30 MG Tb12   Commonly known as:  OXYCONTIN   Take 30 mg by mouth every 12 (twelve) hours.      pantoprazole 40 MG tablet   Commonly known as: PROTONIX   Take 1 tablet (40 mg total) by mouth 2 (two) times daily before a meal.      sucralfate 1 GM/10ML suspension   Commonly known as: CARAFATE   Take 10 mLs (1 g total) by mouth 4 (four) times daily -  with meals and at bedtime.           Outstanding Labs/Studies  He will need f/u bmet later this week along with Lipids/LFT's in 6-8 wks (new statin)  Duration of Discharge Encounter   Greater than 30 minutes including physician time.  Signed, Nicolasa Ducking NP 05/26/2011, 7:11 PM

## 2011-05-27 MED FILL — Nicardipine HCl IV Soln 2.5 MG/ML: INTRAVENOUS | Qty: 1 | Status: AC

## 2011-05-27 NOTE — Discharge Summary (Signed)
Agree as documented. Please see my progress note this same date.  Joe Vincent 05/27/2011 11:21 PM

## 2011-08-27 DIAGNOSIS — E875 Hyperkalemia: Secondary | ICD-10-CM

## 2011-08-27 NOTE — Patient Instructions (Addendum)
Your procedure is scheduled on: 09/01/2011  Report to [facility] at  1000     AM.  Call this number if you have problems the morning of surgery: [phone]   Do not eat food or drink liquids :After Midnight.      Take these medicines the morning of surgery with A SIP OF WATER:lorcet,norvasc,metoprolol,oxycontin,protonix   Do not wear jewelry, make-up or nail polish.  Do not wear lotions, powders, or perfumes. You may wear deodorant.  Do not shave 48 hours prior to surgery.  Do not bring valuables to the hospital.  Contacts, dentures or bridgework may not be worn into surgery.  Leave suitcase in the car. After surgery it may be brought to your room.  For patients admitted to the hospital, checkout time is 11:00 AM the day of discharge.   Patients discharged the day of surgery will not be allowed to drive home.  :     Please read over the following fact sheets that you were given: Coughing and Deep Breathing, Surgical Site Infection Prevention, Anesthesia Post-op Instructions and Care and Recovery After Surgery    Cataract A cataract is a clouding of the lens of the eye. When a lens becomes cloudy, vision is reduced based on the degree and nature of the clouding. Many cataracts reduce vision to some degree. Some cataracts make people more near-sighted as they develop. Other cataracts increase glare. Cataracts that are ignored and become worse can sometimes look white. The white color can be seen through the pupil. CAUSES   Aging. However, cataracts may occur at any age, even in newborns.   Certain drugs.   Trauma to the eye.   Certain diseases such as diabetes.   Specific eye diseases such as chronic inflammation inside the eye or a sudden attack of a rare form of glaucoma.   Inherited or acquired medical problems.  SYMPTOMS   Gradual, progressive drop in vision in the affected eye.   Severe, rapid visual loss. This most often happens when trauma is the cause.  DIAGNOSIS  To  detect a cataract, an eye doctor examines the lens. Cataracts are best diagnosed with an exam of the eyes with the pupils enlarged (dilated) by drops.  TREATMENT  For an early cataract, vision may improve by using different eyeglasses or stronger lighting. If that does not help your vision, surgery is the only effective treatment. A cataract needs to be surgically removed when vision loss interferes with your everyday activities, such as driving, reading, or watching TV. A cataract may also have to be removed if it prevents examination or treatment of another eye problem. Surgery removes the cloudy lens and usually replaces it with a substitute lens (intraocular lens, IOL).  At a time when both you and your doctor agree, the cataract will be surgically removed. If you have cataracts in both eyes, only one is usually removed at a time. This allows the operated eye to heal and be out of danger from any possible problems after surgery (such as infection or poor wound healing). In rare cases, a cataract may be doing damage to your eye. In these cases, your caregiver may advise surgical removal right away. The vast majority of people who have cataract surgery have better vision afterward. HOME CARE INSTRUCTIONS  If you are not planning surgery, you may be asked to do the following:  Use different eyeglasses.   Use stronger or brighter lighting.   Ask your eye doctor about reducing your medicine  dose or changing medicines if it is thought that a medicine caused your cataract. Changing medicines does not make the cataract go away on its own.   Become familiar with your surroundings. Poor vision can lead to injury. Avoid bumping into things on the affected side. You are at a higher risk for tripping or falling.   Exercise extreme care when driving or operating machinery.   Wear sunglasses if you are sensitive to bright light or experiencing problems with glare.  SEEK IMMEDIATE MEDICAL CARE IF:   You have  a worsening or sudden vision loss.   You notice redness, swelling, or increasing pain in the eye.   You have a fever.  Document Released: 01/20/2005 Document Revised: 01/09/2011 Document Reviewed: 09/13/2010 [facility] Patient Information 2012 [place], [place].PATIENT INSTRUCTIONS POST-ANESTHESIA  IMMEDIATELY FOLLOWING SURGERY:  Do not drive or operate machinery for the first twenty four hours after surgery.  Do not make any important decisions for twenty four hours after surgery or while taking narcotic pain medications or sedatives.  If you develop intractable nausea and vomiting or a severe headache please notify your doctor immediately.  FOLLOW-UP:  Please make an appointment with your surgeon as instructed. You do not need to follow up with anesthesia unless specifically instructed to do so.  WOUND CARE INSTRUCTIONS (if applicable):  Keep a dry clean dressing on the anesthesia/puncture wound site if there is drainage.  Once the wound has quit draining you may leave it open to air.  Generally you should leave the bandage intact for twenty four hours unless there is drainage.  If the epidural site drains for more than 36-48 hours please call the anesthesia department.  QUESTIONS?:  Please feel free to call your physician or the hospital operator if you have any questions, and they will be happy to assist you.

## 2011-08-28 ENCOUNTER — Encounter (HOSPITAL_COMMUNITY): Payer: Self-pay | Admitting: Pharmacy Technician

## 2011-08-28 ENCOUNTER — Encounter (HOSPITAL_COMMUNITY)
Admission: RE | Admit: 2011-08-28 | Discharge: 2011-08-28 | Disposition: A | Discharge status: Home / Self Care | Payer: Medicare Other | Source: Ambulatory Visit | Attending: Ophthalmology | Admitting: Ophthalmology

## 2011-08-28 ENCOUNTER — Encounter (HOSPITAL_COMMUNITY): Payer: Self-pay

## 2011-08-28 HISTORY — DX: Acute myocardial infarction, unspecified: I21.9

## 2011-08-28 HISTORY — DX: Major depressive disorder, single episode, unspecified: F32.9

## 2011-08-28 HISTORY — DX: Depression, unspecified: F32.A

## 2011-08-28 HISTORY — DX: Liver disease, unspecified: K76.9

## 2011-08-28 LAB — BASIC METABOLIC PANEL
BUN: 72 mg/dL — ABNORMAL HIGH (ref 6–23)
CO2: 18 mEq/L — ABNORMAL LOW (ref 19–32)
Glucose, Bld: 180 mg/dL — ABNORMAL HIGH (ref 70–99)
Potassium: 6.2 mEq/L — ABNORMAL HIGH (ref 3.5–5.1)
Sodium: 134 mEq/L — ABNORMAL LOW (ref 135–145)

## 2011-08-28 LAB — HEMOGLOBIN AND HEMATOCRIT, BLOOD: HCT: 36.2 % — ABNORMAL LOW (ref 39.0–52.0)

## 2011-08-28 MED ORDER — CYCLOPENTOLATE-PHENYLEPHRINE 0.2-1 % OP SOLN
OPHTHALMIC | Status: AC
  Filled 2011-08-28: qty 2

## 2011-08-28 NOTE — Pre-Procedure Instructions (Signed)
Labs from PAT today pulled up to evaluate. Bun-72,creat 6.35 and k+ 6.2 noted and called to Dr Lita Mains. No orders given at this time except to repeat i-stat on arrival 09-01-2011. Dr Jayme Cloud also called,sts pt should be sent directly to his medical md or to ED by Dr Lita Mains. Called Dr Lita Mains again, he is to call patient and have him go directly to his medical md or to the ED. Dr Lita Mains is to call patient now. Will keep patient on schedule for now to see what course of action will be taken for his renal failure.

## 2011-08-28 NOTE — Progress Notes (Signed)
08/28/11 1401  OBSTRUCTIVE SLEEP APNEA  Have you ever been diagnosed with sleep apnea through a sleep study? Yes  If yes, do you have and use a CPAP or BPAP machine every night? 0  Do you snore loudly (loud enough to be heard through closed doors)?  1  Do you often feel tired, fatigued, or sleepy during the daytime? 1  Has anyone observed you stop breathing during your sleep? 1  Do you have, or are you being treated for high blood pressure? 1  BMI more than 35 kg/m2? 0  Age over 49 years old? 1  Neck circumference greater than 40 cm/18 inches? 0  Gender: 1  Obstructive Sleep Apnea Score 6   Score 4 or greater  Updated health history;Results sent to PCP

## 2011-08-29 NOTE — OR Nursing (Signed)
Pt. States he went to [facility] yesterday and saw Dr. Burman Freestone.  MH   Did blood work. " states that he should be ok to proceed with surgery".  I called [facility] spoke with medical records and sent request  To obtain lab results. Pt. Coming Monday for surgery . Still waiting on results.

## 2011-09-01 ENCOUNTER — Ambulatory Visit (HOSPITAL_COMMUNITY)
Admission: RE | Admit: 2011-09-01 | Discharge: 2011-09-01 | Disposition: A | Discharge status: Home / Self Care | Payer: Medicare Other | Source: Ambulatory Visit | Attending: Ophthalmology | Admitting: Ophthalmology

## 2011-09-01 ENCOUNTER — Encounter (HOSPITAL_COMMUNITY)
Admission: RE | Disposition: A | Discharge status: Home / Self Care | Payer: Self-pay | Source: Ambulatory Visit | Attending: Ophthalmology

## 2011-09-01 ENCOUNTER — Encounter (HOSPITAL_COMMUNITY): Payer: Self-pay | Admitting: *Deleted

## 2011-09-01 ENCOUNTER — Encounter (HOSPITAL_COMMUNITY): Payer: Self-pay | Admitting: Anesthesiology

## 2011-09-01 DIAGNOSIS — Z5309 Procedure and treatment not carried out because of other contraindication: Secondary | ICD-10-CM | POA: Insufficient documentation

## 2011-09-01 DIAGNOSIS — Z01812 Encounter for preprocedural laboratory examination: Secondary | ICD-10-CM | POA: Insufficient documentation

## 2011-09-01 DIAGNOSIS — H2510 Age-related nuclear cataract, unspecified eye: Secondary | ICD-10-CM | POA: Insufficient documentation

## 2011-09-01 LAB — COMPREHENSIVE METABOLIC PANEL
ALT: 30 U/L (ref 0–53)
Alkaline Phosphatase: 139 U/L — ABNORMAL HIGH (ref 39–117)
BUN: 68 mg/dL — ABNORMAL HIGH (ref 6–23)
CO2: 15 mEq/L — ABNORMAL LOW (ref 19–32)
Chloride: 113 mEq/L — ABNORMAL HIGH (ref 96–112)
GFR calc Af Amer: 10 mL/min — ABNORMAL LOW (ref >90)
GFR calc non Af Amer: 9 mL/min — ABNORMAL LOW (ref >90)
Glucose, Bld: 127 mg/dL — ABNORMAL HIGH (ref 70–99)
Potassium: 5.1 mEq/L (ref 3.5–5.1)
Sodium: 140 mEq/L (ref 135–145)
Total Bilirubin: 0.2 mg/dL — ABNORMAL LOW (ref 0.3–1.2)
Total Protein: 6.9 g/dL (ref 6.0–8.3)

## 2011-09-01 SURGERY — PHACOEMULSIFICATION, CATARACT, WITH IOL INSERTION
Anesthesia: Monitor Anesthesia Care | Laterality: Right

## 2011-09-01 MED ORDER — CYCLOPENTOLATE-PHENYLEPHRINE 0.2-1 % OP SOLN: 1.0000 [drp] | OPHTHALMIC | Status: AC

## 2011-09-01 MED ORDER — MOXIFLOXACIN HCL 0.5 % OP SOLN - NO CHARGE
1.0000 [drp] | Freq: Once | OPHTHALMIC | Status: DC
  Filled 2011-09-01: qty 3

## 2011-09-01 MED ORDER — KETOROLAC TROMETHAMINE 0.4 % OP SOLN - NO CHARGE
1.0000 [drp] | Freq: Once | OPHTHALMIC | Status: DC
  Filled 2011-09-01: qty 5

## 2011-09-01 MED ORDER — LIDOCAINE HCL 3.5 % OP GEL
OPHTHALMIC | Status: AC
  Filled 2011-09-01: qty 5

## 2011-09-01 MED ORDER — TETRACAINE HCL 0.5 % OP SOLN: 1.0000 [drp] | OPHTHALMIC | Status: AC

## 2011-09-01 MED ORDER — GATIFLOXACIN 0.5 % OP SOLN OPTIME - NO CHARGE
1.0000 [drp] | Freq: Once | OPHTHALMIC | Status: DC
  Filled 2011-09-01: qty 2.5

## 2011-09-01 MED ORDER — EPINEPHRINE HCL 1 MG/ML IJ SOLN
INTRAMUSCULAR | Status: AC
  Filled 2011-09-01: qty 1

## 2011-09-01 MED ORDER — TETRACAINE HCL 0.5 % OP SOLN
OPHTHALMIC | Status: AC
  Filled 2011-09-01: qty 2

## 2011-09-01 SURGICAL SUPPLY — 26 items
CAPSULAR TENSION RING-AMO (OPHTHALMIC RELATED) IMPLANT
CLOTH BEACON ORANGE TIMEOUT ST (SAFETY) IMPLANT
GLOVE BIO SURGEON STRL SZ7.5 (GLOVE) IMPLANT
GLOVE BIOGEL M 6.5 STRL (GLOVE) IMPLANT
GLOVE BIOGEL PI IND STRL 6.5 (GLOVE) IMPLANT
GLOVE BIOGEL PI IND STRL 7.0 (GLOVE) IMPLANT
GLOVE BIOGEL PI INDICATOR 6.5 (GLOVE)
GLOVE BIOGEL PI INDICATOR 7.0 (GLOVE)
GLOVE ECLIPSE 6.5 STRL STRAW (GLOVE) IMPLANT
GLOVE ECLIPSE 7.5 STRL STRAW (GLOVE) IMPLANT
GLOVE EXAM NITRILE LRG STRL (GLOVE) IMPLANT
GLOVE EXAM NITRILE MD LF STRL (GLOVE) IMPLANT
GLOVE SKINSENSE NS SZ6.5 (GLOVE)
GLOVE SKINSENSE NS SZ7.0 (GLOVE)
GLOVE SKINSENSE STRL SZ6.5 (GLOVE) IMPLANT
GLOVE SKINSENSE STRL SZ7.0 (GLOVE) IMPLANT
INST SET CATARACT ~~LOC~~ (KITS) ×2 IMPLANT
KIT VITRECTOMY (OPHTHALMIC RELATED) IMPLANT
PAD ARMBOARD 7.5X6 YLW CONV (MISCELLANEOUS) IMPLANT
PROC W NO LENS (INTRAOCULAR LENS)
PROC W SPEC LENS (INTRAOCULAR LENS)
PROCESS W NO LENS (INTRAOCULAR LENS) IMPLANT
PROCESS W SPEC LENS (INTRAOCULAR LENS) IMPLANT
RING MALYGIN (MISCELLANEOUS) IMPLANT
VISCOELASTIC ADDITIONAL (OPHTHALMIC RELATED) IMPLANT
WATER STERILE IRR 250ML POUR (IV SOLUTION) IMPLANT

## 2011-09-01 NOTE — H&P (Signed)
Stat pre op lab work was reviewed with Dr. Jayme Cloud.  Due to the recent apparent exacerbation of his chronic renal failure, the surgery was cancelled for today.  His personal physician was notified by telephone this morning.  I spoke with Donzetta Sprung of the [facility] practice and gave him the information supporting my decision to postpone today's elective cataract surgery due to an unstable clinical situation.

## 2011-09-01 NOTE — Anesthesia Preprocedure Evaluation (Deleted)
Anesthesia Evaluation Anesthesia Physical Anesthesia Plan Anesthesia Quick Evaluation  

## 2011-09-03 NOTE — OR Nursing (Signed)
Upon arrival to preop,  Labs repeated due to abnormal labs on preop work up.  Pt. Still having issues with renal failure. Surgery was cancelled by Dr.Gonzalez.

## 2011-10-02 DIAGNOSIS — I10 Essential (primary) hypertension: Secondary | ICD-10-CM

## 2011-10-17 DIAGNOSIS — R079 Chest pain, unspecified: Secondary | ICD-10-CM

## 2011-10-20 ENCOUNTER — Ambulatory Visit: Payer: Self-pay | Admitting: Vascular Surgery

## 2011-10-20 LAB — BASIC METABOLIC PANEL
Anion Gap: 12 (ref 7–16)
BUN: 75 mg/dL — ABNORMAL HIGH (ref 7–18)
Co2: 17 mmol/L — ABNORMAL LOW (ref 21–32)
Creatinine: 9.3 mg/dL — ABNORMAL HIGH (ref 0.60–1.30)
EGFR (African American): 7 — ABNORMAL LOW
EGFR (Non-African Amer.): 6 — ABNORMAL LOW
Potassium: 6.6 mmol/L (ref 3.5–5.1)
Sodium: 128 mmol/L — ABNORMAL LOW (ref 136–145)

## 2011-10-20 LAB — CBC
HCT: 32.9 % — ABNORMAL LOW (ref 40.0–52.0)
HGB: 10.8 g/dL — ABNORMAL LOW (ref 13.0–18.0)
MCH: 29.7 pg (ref 26.0–34.0)
MCV: 90 fL (ref 80–100)
Platelet: 188 10*3/uL (ref 150–440)
RBC: 3.64 10*6/uL — ABNORMAL LOW (ref 4.40–5.90)

## 2011-11-27 ENCOUNTER — Ambulatory Visit: Payer: Self-pay | Admitting: Ophthalmology

## 2011-12-01 ENCOUNTER — Ambulatory Visit: Payer: Self-pay | Admitting: Vascular Surgery

## 2011-12-01 LAB — CBC
HCT: 32.5 % — ABNORMAL LOW (ref 40.0–52.0)
HGB: 11.2 g/dL — ABNORMAL LOW (ref 13.0–18.0)
MCH: 31.2 pg (ref 26.0–34.0)
MCHC: 34.4 g/dL (ref 32.0–36.0)
RDW: 15.4 % — ABNORMAL HIGH (ref 11.5–14.5)
WBC: 7 10*3/uL (ref 3.8–10.6)

## 2011-12-01 LAB — BASIC METABOLIC PANEL
Anion Gap: 12 (ref 7–16)
BUN: 42 mg/dL — ABNORMAL HIGH (ref 7–18)
Co2: 22 mmol/L (ref 21–32)
Creatinine: 8.79 mg/dL — ABNORMAL HIGH (ref 0.60–1.30)
EGFR (African American): 7 — ABNORMAL LOW
EGFR (Non-African Amer.): 6 — ABNORMAL LOW
Glucose: 233 mg/dL — ABNORMAL HIGH (ref 65–99)
Osmolality: 292 (ref 275–301)
Sodium: 137 mmol/L (ref 136–145)

## 2011-12-03 ENCOUNTER — Ambulatory Visit: Payer: Self-pay | Admitting: Vascular Surgery

## 2011-12-03 LAB — CBC WITH DIFFERENTIAL/PLATELET
Basophil #: 0.1 10*3/uL (ref 0.0–0.1)
Basophil %: 1.3 %
Eosinophil #: 0.1 10*3/uL (ref 0.0–0.7)
Eosinophil %: 1 %
HCT: 32.9 % — ABNORMAL LOW (ref 40.0–52.0)
HGB: 11.2 g/dL — ABNORMAL LOW (ref 13.0–18.0)
Lymphocyte #: 1.2 10*3/uL (ref 1.0–3.6)
Lymphocyte %: 19 %
MCH: 30.5 pg (ref 26.0–34.0)
MCHC: 34.1 g/dL (ref 32.0–36.0)
MCV: 89 fL (ref 80–100)
Monocyte #: 0.6 x10 3/mm (ref 0.2–1.0)
Monocyte %: 8.8 %
Neutrophil #: 4.4 10*3/uL (ref 1.4–6.5)
Neutrophil %: 69.9 %
Platelet: 196 10*3/uL (ref 150–440)
RBC: 3.68 10*6/uL — ABNORMAL LOW (ref 4.40–5.90)
RDW: 15.4 % — ABNORMAL HIGH (ref 11.5–14.5)
WBC: 6.3 10*3/uL (ref 3.8–10.6)

## 2011-12-03 LAB — BASIC METABOLIC PANEL
BUN: 27 mg/dL — ABNORMAL HIGH (ref 7–18)
Calcium, Total: 8.2 mg/dL — ABNORMAL LOW (ref 8.5–10.1)
EGFR (African American): 10 — ABNORMAL LOW
EGFR (Non-African Amer.): 9 — ABNORMAL LOW
Glucose: 154 mg/dL — ABNORMAL HIGH (ref 65–99)
Osmolality: 276 (ref 275–301)
Sodium: 134 mmol/L — ABNORMAL LOW (ref 136–145)

## 2012-02-25 ENCOUNTER — Ambulatory Visit: Payer: Self-pay | Admitting: Ophthalmology

## 2012-04-02 ENCOUNTER — Ambulatory Visit: Payer: Self-pay | Admitting: Specialist

## 2012-04-02 LAB — RENAL FUNCTION PANEL
Albumin: 3.8 g/dL (ref 3.4–5.0)
Anion Gap: 12 (ref 7–16)
BUN: 62 mg/dL — ABNORMAL HIGH (ref 7–18)
Chloride: 93 mmol/L — ABNORMAL LOW (ref 98–107)
Creatinine: 15.34 mg/dL — ABNORMAL HIGH (ref 0.60–1.30)
Glucose: 237 mg/dL — ABNORMAL HIGH (ref 65–99)
Osmolality: 288 (ref 275–301)
Phosphorus: 7.6 mg/dL — ABNORMAL HIGH (ref 2.5–4.9)
Sodium: 131 mmol/L — ABNORMAL LOW (ref 136–145)

## 2012-04-02 LAB — LIPID PANEL
HDL Cholesterol: 31 mg/dL — ABNORMAL LOW (ref 40–60)
Ldl Cholesterol, Calc: 71 mg/dL (ref 0–100)
VLDL Cholesterol, Calc: 32 mg/dL (ref 5–40)

## 2012-04-03 LAB — BASIC METABOLIC PANEL
Anion Gap: 13 (ref 7–16)
BUN: 58 mg/dL — ABNORMAL HIGH (ref 7–18)
Calcium, Total: 8.3 mg/dL — ABNORMAL LOW (ref 8.5–10.1)
Creatinine: 13.81 mg/dL — ABNORMAL HIGH (ref 0.60–1.30)
EGFR (African American): 4 — ABNORMAL LOW
Glucose: 55 mg/dL — ABNORMAL LOW (ref 65–99)
Potassium: 5 mmol/L (ref 3.5–5.1)
Sodium: 133 mmol/L — ABNORMAL LOW (ref 136–145)

## 2012-04-05 ENCOUNTER — Ambulatory Visit: Payer: Self-pay | Admitting: Vascular Surgery

## 2012-05-27 IMAGING — US US RENAL
1 series · 14 of 25 positions shown · non-contrast
Comparison: Ultrasound dated 01/17/2010

CLINICAL DATA: Acute renal failure and chronic kidney disease.
Diabetic neuropathy.

RENAL/URINARY TRACT ULTRASOUND COMPLETE

[Series 1: us renal · 0.31mm/px · 14 of 37 slices shown]
[im 1/37]
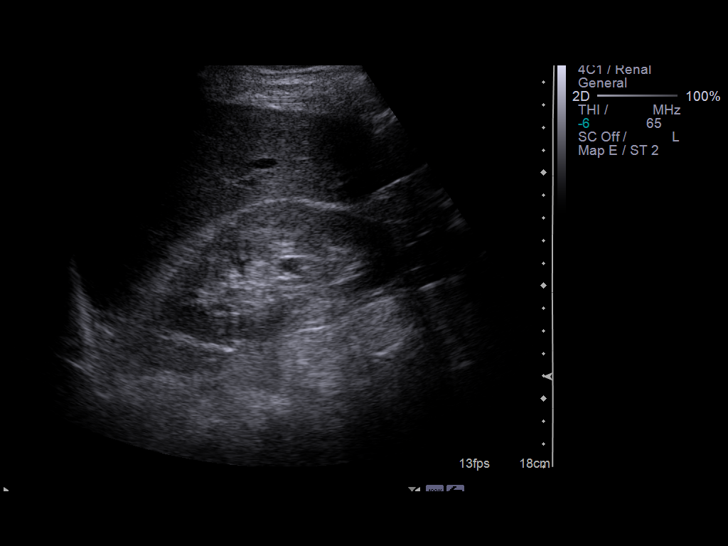
[im 4/37]
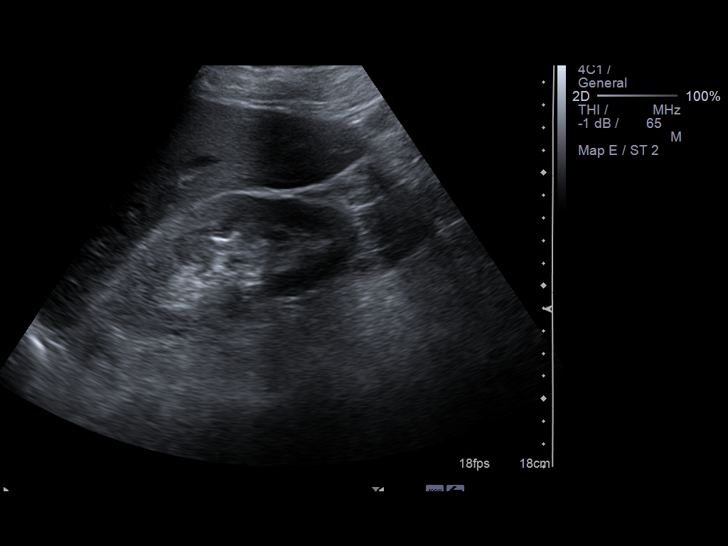
[im 7/37]
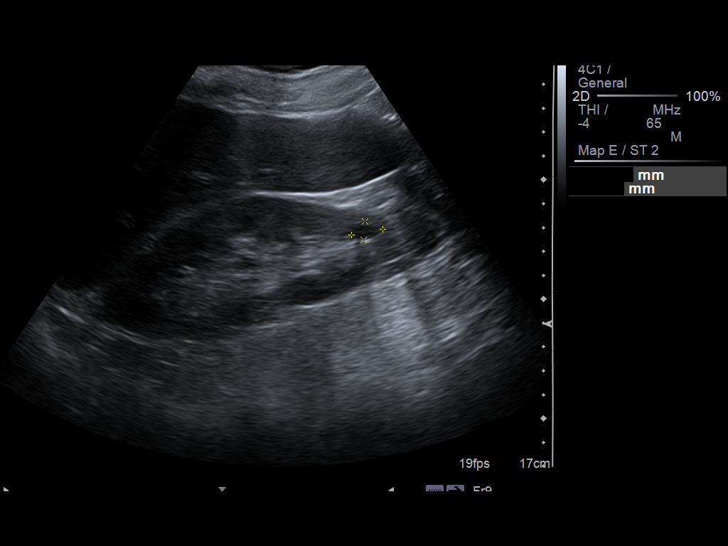
[im 10/37]
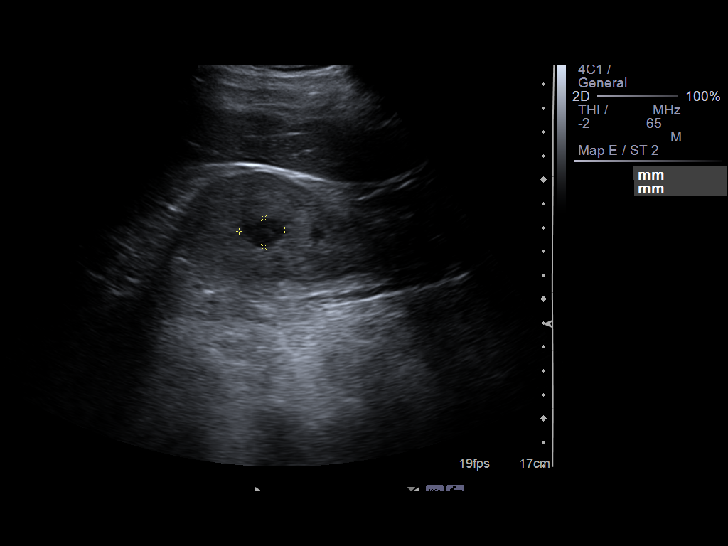
[im 13/37]
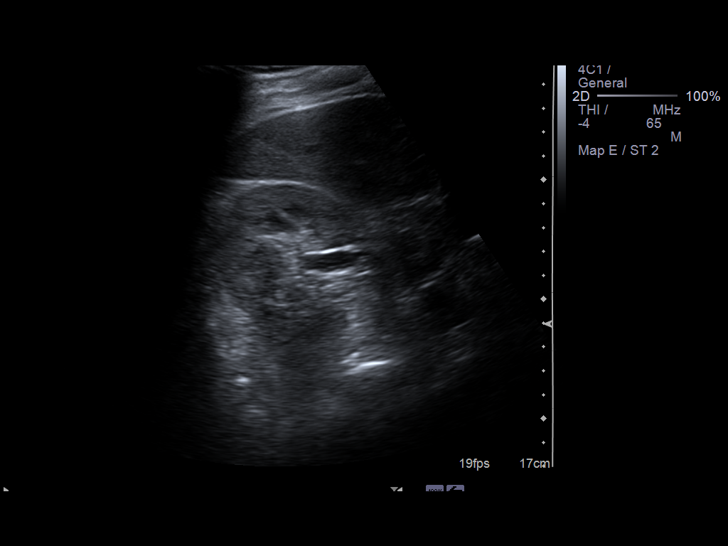
[im 14/37]
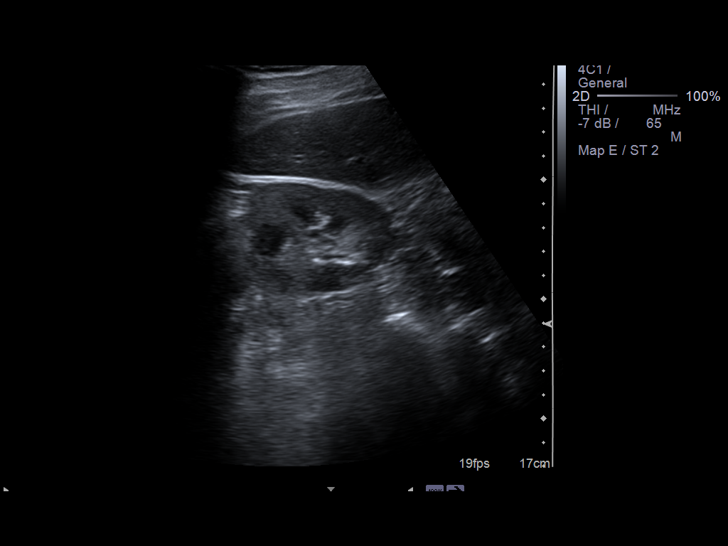
[im 17/37]
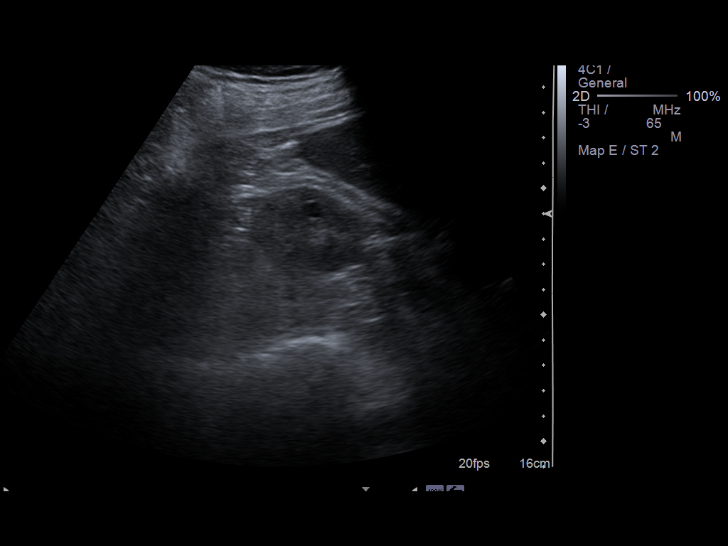
[im 20/37]
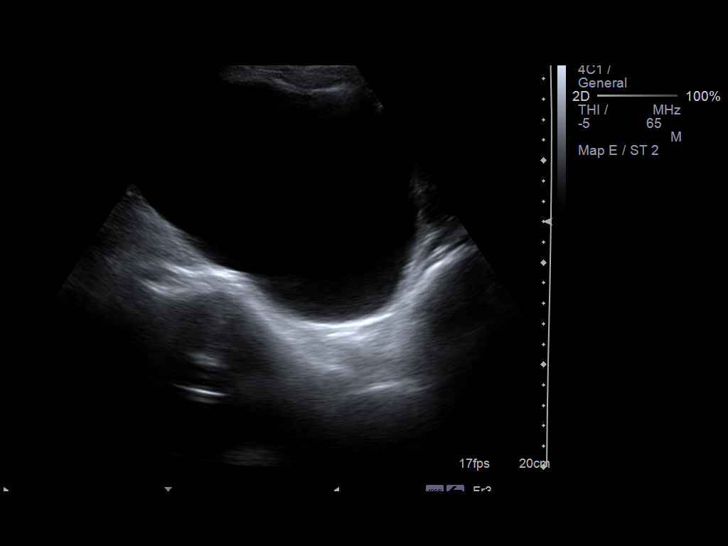
[im 23/37]
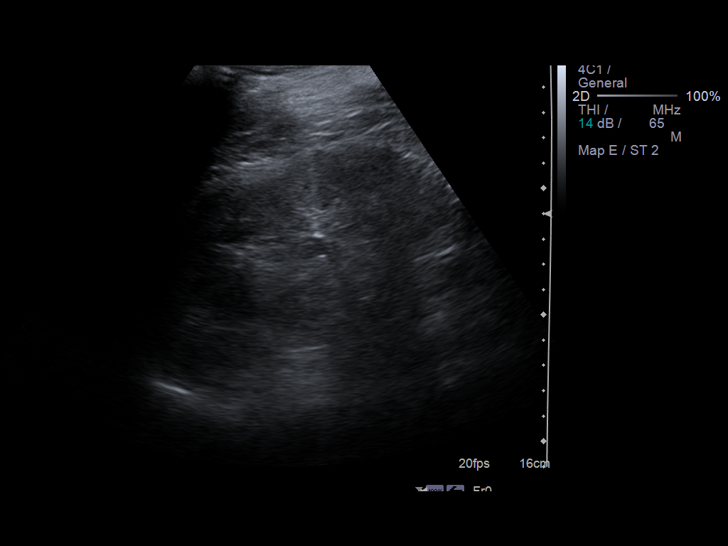
[im 25/37]
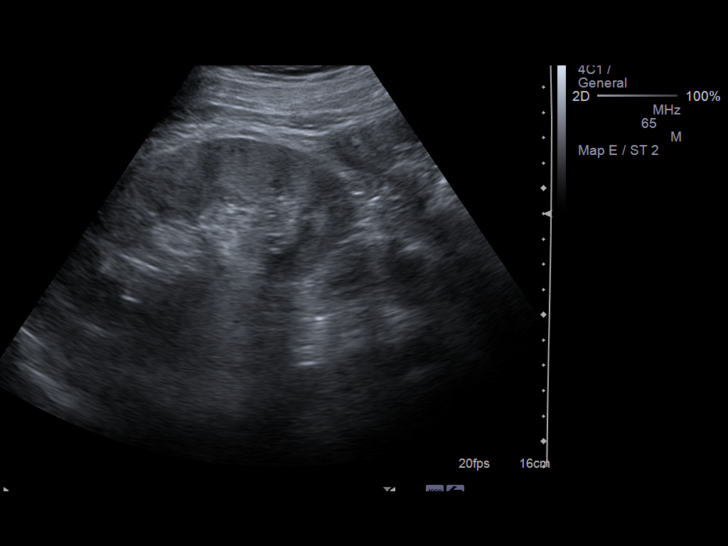
[im 28/37]
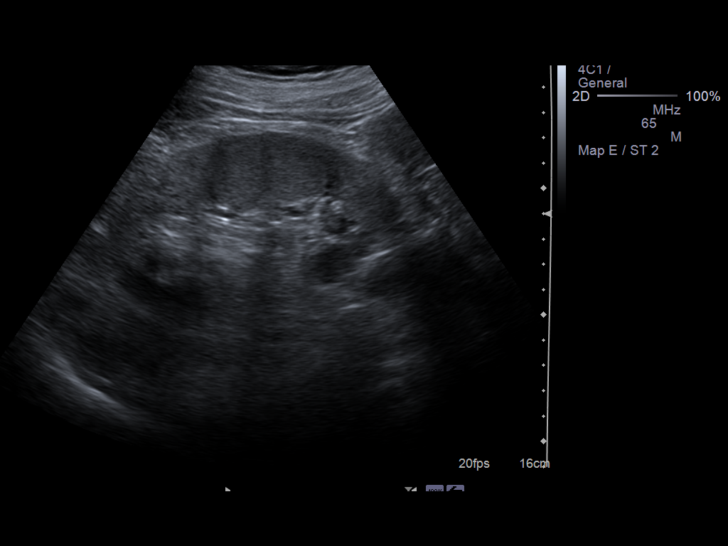
[im 31/37]
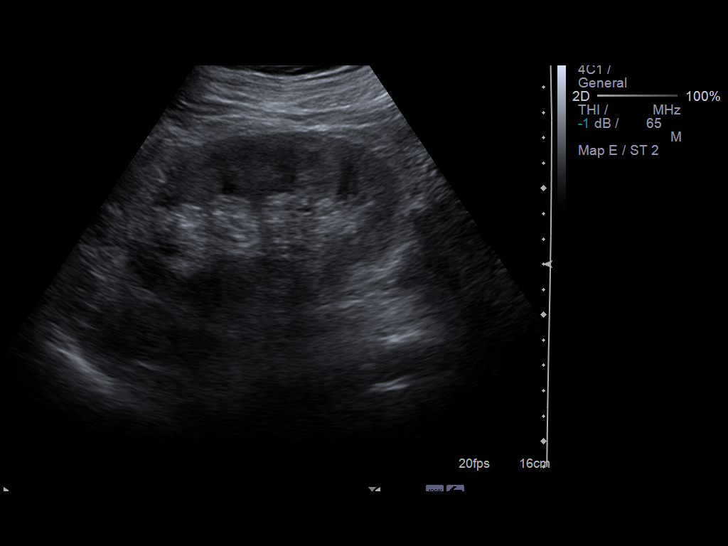
[im 34/37]
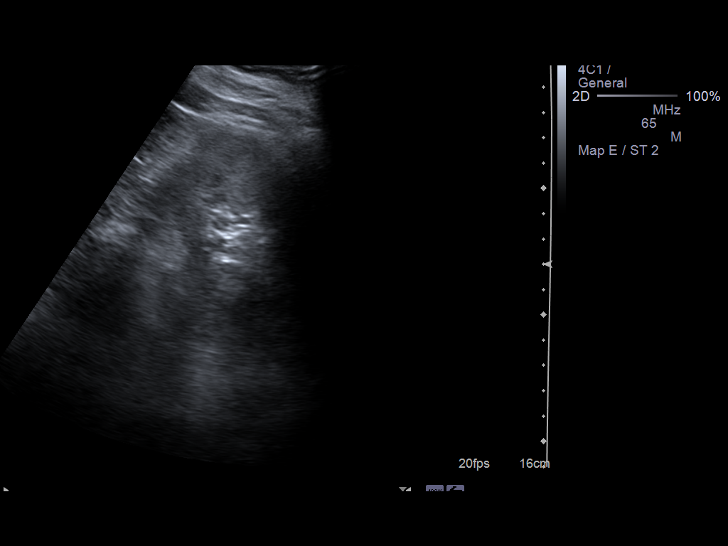
[im 37/37]
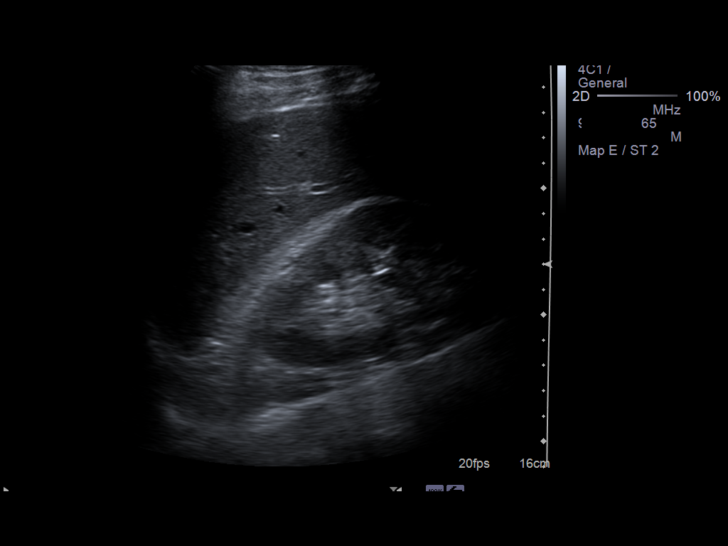

[14 of 25 positions shown; findings below may reference images not displayed]

FINDINGS: Right Kidney:  12.4 cm in length.  Echogenic renal parenchyma.  13
mm cyst on the lower pole months decreased in size since the prior
exam..

Left Kidney:  11.9 cm in length.  Echogenic renal parenchyma.  No
obstruction.

Bladder:  Normal.
IMPRESSION: No evidence of obstruction of either kidney.  Echogenic renal
parenchyma is essentially unchanged.  Complex cystic lesion on the
lower pole of the right kidney has diminished in size since the
prior exam and probably represented a hemorrhagic or proteinaceous
cyst.

## 2012-05-29 DIAGNOSIS — R079 Chest pain, unspecified: Secondary | ICD-10-CM

## 2012-07-07 DIAGNOSIS — R079 Chest pain, unspecified: Secondary | ICD-10-CM

## 2012-07-29 DIAGNOSIS — R079 Chest pain, unspecified: Secondary | ICD-10-CM

## 2012-08-02 ENCOUNTER — Encounter (HOSPITAL_COMMUNITY): Payer: Self-pay | Admitting: *Deleted

## 2012-08-02 ENCOUNTER — Emergency Department (HOSPITAL_COMMUNITY)
Admission: EM | Admit: 2012-08-02 | Discharge: 2012-08-02 | Disposition: A | Discharge status: Home / Self Care | Payer: Medicare Other | Attending: Emergency Medicine | Admitting: Emergency Medicine

## 2012-08-02 DIAGNOSIS — Z8669 Personal history of other diseases of the nervous system and sense organs: Secondary | ICD-10-CM | POA: Insufficient documentation

## 2012-08-02 DIAGNOSIS — I252 Old myocardial infarction: Secondary | ICD-10-CM | POA: Insufficient documentation

## 2012-08-02 DIAGNOSIS — Z8659 Personal history of other mental and behavioral disorders: Secondary | ICD-10-CM | POA: Insufficient documentation

## 2012-08-02 DIAGNOSIS — K5289 Other specified noninfective gastroenteritis and colitis: Secondary | ICD-10-CM | POA: Insufficient documentation

## 2012-08-02 DIAGNOSIS — F1123 Opioid dependence with withdrawal: Secondary | ICD-10-CM

## 2012-08-02 DIAGNOSIS — R05 Cough: Secondary | ICD-10-CM | POA: Insufficient documentation

## 2012-08-02 DIAGNOSIS — E119 Type 2 diabetes mellitus without complications: Secondary | ICD-10-CM | POA: Insufficient documentation

## 2012-08-02 DIAGNOSIS — Z85038 Personal history of other malignant neoplasm of large intestine: Secondary | ICD-10-CM | POA: Insufficient documentation

## 2012-08-02 DIAGNOSIS — R109 Unspecified abdominal pain: Secondary | ICD-10-CM | POA: Insufficient documentation

## 2012-08-02 DIAGNOSIS — F19939 Other psychoactive substance use, unspecified with withdrawal, unspecified: Secondary | ICD-10-CM | POA: Insufficient documentation

## 2012-08-02 DIAGNOSIS — Z8719 Personal history of other diseases of the digestive system: Secondary | ICD-10-CM | POA: Insufficient documentation

## 2012-08-02 DIAGNOSIS — R0602 Shortness of breath: Secondary | ICD-10-CM | POA: Insufficient documentation

## 2012-08-02 DIAGNOSIS — Z794 Long term (current) use of insulin: Secondary | ICD-10-CM | POA: Insufficient documentation

## 2012-08-02 DIAGNOSIS — Z79899 Other long term (current) drug therapy: Secondary | ICD-10-CM | POA: Insufficient documentation

## 2012-08-02 DIAGNOSIS — I129 Hypertensive chronic kidney disease with stage 1 through stage 4 chronic kidney disease, or unspecified chronic kidney disease: Secondary | ICD-10-CM | POA: Insufficient documentation

## 2012-08-02 DIAGNOSIS — R112 Nausea with vomiting, unspecified: Secondary | ICD-10-CM | POA: Insufficient documentation

## 2012-08-02 DIAGNOSIS — R059 Cough, unspecified: Secondary | ICD-10-CM | POA: Insufficient documentation

## 2012-08-02 LAB — CBC WITH DIFFERENTIAL/PLATELET
Basophils Absolute: 0.1 10*3/uL (ref 0.0–0.1)
Basophils Relative: 1 % (ref 0–1)
HCT: 35 % — ABNORMAL LOW (ref 39.0–52.0)
Lymphocytes Relative: 15 % (ref 12–46)
MCHC: 33.4 g/dL (ref 30.0–36.0)
Monocytes Absolute: 0.7 10*3/uL (ref 0.1–1.0)
Neutro Abs: 5.3 10*3/uL (ref 1.7–7.7)
Neutrophils Relative %: 73 % (ref 43–77)
Platelets: 146 10*3/uL — ABNORMAL LOW (ref 150–400)
RDW: 14 % (ref 11.5–15.5)
WBC: 7.2 10*3/uL (ref 4.0–10.5)

## 2012-08-02 LAB — COMPREHENSIVE METABOLIC PANEL
ALT: 28 U/L (ref 0–53)
AST: 22 U/L (ref 0–37)
Albumin: 3.3 g/dL — ABNORMAL LOW (ref 3.5–5.2)
CO2: 26 mEq/L (ref 19–32)
Chloride: 91 mEq/L — ABNORMAL LOW (ref 96–112)
Creatinine, Ser: 9.58 mg/dL — ABNORMAL HIGH (ref 0.50–1.35)
Sodium: 131 mEq/L — ABNORMAL LOW (ref 135–145)
Total Bilirubin: 0.2 mg/dL — ABNORMAL LOW (ref 0.3–1.2)

## 2012-08-02 MED ORDER — GI COCKTAIL ~~LOC~~: 30.0000 mL | Freq: Once | ORAL | Status: DC

## 2012-08-02 MED ORDER — ONDANSETRON 8 MG PO TBDP
8.0000 mg | ORAL_TABLET | Freq: Once | ORAL | Status: AC
  Administered 2012-08-02: 8 mg via ORAL
  Filled 2012-08-02: qty 1

## 2012-08-02 MED ORDER — SODIUM CHLORIDE 0.9 % IV BOLUS (SEPSIS)
500.0000 mL | Freq: Once | INTRAVENOUS | Status: AC
  Administered 2012-08-02: 500 mL via INTRAVENOUS

## 2012-08-02 MED ORDER — DIPHENHYDRAMINE HCL 50 MG/ML IJ SOLN
25.0000 mg | Freq: Once | INTRAMUSCULAR | Status: AC
  Administered 2012-08-02: 25 mg via INTRAVENOUS
  Filled 2012-08-02: qty 1

## 2012-08-02 MED ORDER — FAMOTIDINE 20 MG PO TABS
20.0000 mg | ORAL_TABLET | Freq: Once | ORAL | Status: AC
  Administered 2012-08-02: 20 mg via ORAL
  Filled 2012-08-02: qty 1

## 2012-08-02 MED ORDER — ONDANSETRON HCL 4 MG/2ML IJ SOLN
4.0000 mg | Freq: Once | INTRAMUSCULAR | Status: AC
  Administered 2012-08-02: 4 mg via INTRAVENOUS
  Filled 2012-08-02: qty 2

## 2012-08-02 MED ORDER — ONDANSETRON HCL 4 MG PO TABS: 4.0000 mg | ORAL_TABLET | Freq: Four times a day (QID) | ORAL | Status: DC

## 2012-08-02 MED ORDER — METOCLOPRAMIDE HCL 5 MG/ML IJ SOLN
10.0000 mg | Freq: Once | INTRAMUSCULAR | Status: AC
  Administered 2012-08-02: 10 mg via INTRAVENOUS
  Filled 2012-08-02: qty 2

## 2012-08-02 MED ORDER — ALUMINUM HYDROXIDE GEL 320 MG/5ML PO SUSP
30.0000 mL | Freq: Once | ORAL | Status: AC
  Administered 2012-08-02: 30 mL via ORAL
  Filled 2012-08-02: qty 30

## 2012-08-02 NOTE — ED Provider Notes (Signed)
History    This chart was scribed for [name], MD by [name], ED Scribe. This patient was seen in room APA03/APA03 and the patient's care was started at 0822.  CSN: [id number] Arrival date & time 08/02/12  0740  First MD Initiated Contact with Patient 08/02/12 [phone]     Chief Complaint  Patient presents with  . Emesis    The history is provided by the patient. No language interpreter was used.   HPI Comments: Joe Vincent is a 52 y.o. male who presents to the Emergency Department complaining of 3 days of nausea and vomiting which began 3 days ago when he has discharged from [facility]. He states he was seeing his PCP 5 days ago for 1 day of vomiting and his  PCP called EMS for him to be admitted. He was discharged 3 days ago.  He states he last had dialysis 2 days ago and has been experiencing neuropathy, lung pain, and acid reflex ever since. The pain starts in his abdomen and radiates up his chest. He has been on dialysis since august of 2013. Dr. [name] is his dialysis MD. He has had DM since he was 8. He also reports pain with deep breaths, a productive cough with green sputum and SOB. He deneis fever, diarrhea or any other pain.  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  He also reports he had colon cancer in 2009 and had part on his intestines removed. He reports he was recently told he has a heart murmur, but is unsure of the cardiologist he saw. He does not have GI MD and has had a endoscopy performed before.  Dr. [name] is his PCP. He smokes 3 cigarettes a day but denies alcohol use.   Past Medical History  Diagnosis Date  . Diabetes mellitus   . Hypertension   . GERD (gastroesophageal reflux disease)   . Retinopathy   . Peripheral neuropathy   . Chronic kidney disease   . Cancer     Colon  . Chest pain     a. 05/2011 Cath: nonobs dzs.  . Myocardial infarction 2000  . Depression   . Liver disease    Past Surgical History  Procedure Laterality Date  . Colostomy   Ileostomy    colon cancer  . Amputation      left pinky toe  . Esophagogastroduodenoscopy  05/23/2011    Procedure: ESOPHAGOGASTRODUODENOSCOPY (EGD);  Surgeon: Theda Belfast, MD;  Location: [facility] ENDOSCOPY;  Service: Endoscopy;  Laterality: N/A;  . Colon surgery  2009  . Cardiac catheterization  2012  . Eye surgery     Family History  Problem Relation Age of Onset  . Cancer Mother   . Heart disease Father   . Cancer Father    History  Substance Use Topics  . Smoking status: Current Every Day Smoker -- 1.00 packs/day for 30 years    Types: Cigarettes, Cigars  . Smokeless tobacco: Never Used  . Alcohol Use: No    Review of Systems  Constitutional: Negative for fever.  Respiratory: Positive for cough and shortness of breath.   Gastrointestinal: Positive for nausea, vomiting and abdominal pain. Negative for diarrhea.  All other systems reviewed and are negative.    Allergies  Codeine  Home Medications   Current Outpatient Rx  Name  Route  Sig  Dispense  Refill  . amLODipine (NORVASC) 10 MG tablet   Oral   Take 10 mg by mouth daily.         [name]  aspirin 325 MG tablet   Oral   Take 325 mg by mouth every 6 (six) hours as needed. For pain         . EXPIRED: atorvastatin (LIPITOR) 40 MG tablet   Oral   Take 1 tablet (40 mg total) by mouth daily at 6 PM.   30 tablet   3   . GREEN TEA, CAMILLIA SINENSIS, PO   Oral   Take 1 capsule by mouth daily.         . insulin glargine (LANTUS) 100 UNIT/ML injection   Subcutaneous   Inject 60 Units into the skin at bedtime.         . insulin lispro (HUMALOG) 100 UNIT/ML injection   Subcutaneous   Inject 20 Units into the skin 3 (three) times daily.         [name] EXPIRED: metoprolol (LOPRESSOR) 100 MG tablet   Oral   Take 1 tablet (100 mg total) by mouth 2 (two) times daily.   60 tablet   3    BP 157/85  Pulse 89  Temp(Src) 97.7 F (36.5 C) (Oral)  Resp 20  SpO2 97%  Vital signs normal    Physical Exam   Nursing note and vitals reviewed. Constitutional: He is oriented to person, place, and time.  Non-toxic appearance. He does not appear ill. No distress.  Thin male  HENT:  Head: Normocephalic and atraumatic.  Right Ear: External ear normal.  Left Ear: External ear normal.  Nose: Nose normal. No mucosal edema or rhinorrhea.  Mouth/Throat: Mucous membranes are dry. No dental abscesses or edematous.  Poor dentition.   Eyes: Conjunctivae and EOM are normal. Pupils are equal, round, and reactive to light.  Neck: Normal range of motion and full passive range of motion without pain. Neck supple.  Cardiovascular: Normal rate and regular rhythm.  Exam reveals no gallop and no friction rub.   Murmur heard.  Systolic murmur is present  Harsh systolic murmur heard best a right sternal border.   Pulmonary/Chest: Effort normal and breath sounds normal. No respiratory distress. He has no wheezes. He has no rhonchi. He has no rales. He exhibits no tenderness and no crepitus.  Abdominal: Soft. Normal appearance and bowel sounds are normal. He exhibits no distension. There is tenderness. There is no rebound and no guarding.  Diffuse abdominal tenderness. ileostomy in place with active passage of gas and stool   Genitourinary:  Ileostomy bag functioning well and passing signigicant gas and liquids through.   Musculoskeletal: Normal range of motion. He exhibits no edema and no tenderness.  Moves all extremities well. Dialysis port placed in right arm.  Neurological: He is alert and oriented to person, place, and time. He has normal strength. No cranial nerve deficit.  Skin: Skin is warm, dry and intact. No rash noted. No erythema. No pallor.  Psychiatric: He has a normal mood and affect. His speech is normal and behavior is normal. His mood appears not anxious.    ED Course  Procedures (including critical care time) DIAGNOSTIC STUDIES: Oxygen Saturation is 97% on RA, adequate by my interpretation.     COORDINATION OF CARE: 8:25 AM Discussed treatment plan which includes EKG, labs, pepcid, zofran and amphojel with pt at bedside and pt agreed to plan. Pt denies IV at this time.  9:57 AM Rechecked pt, who reports little improvement. Informed of labs. I will order IV fluids, Zofran and reglan.  12:25 PM Rechecked pt, who now reports his nausea is  resolved but he now complains of neuropathy pain. When asked if he takes gabapentin for his pain he states he was on dilaudid, but when he was in the hospital they wanted him to change his pain medication. He states his MD is in the process of changing his pain medication from Dilaudid to something else. He has an appointment this morning at 11 am to discuss his pain medication but missed it because he was in the ED. He ran out of his 4 mg Dilaudid that he took BID on 07/30/12.   Results for orders placed during the hospital encounter of 08/02/12  CBC WITH DIFFERENTIAL      Result Value Range   WBC 7.2  4.0 - 10.5 K/uL   RBC 3.70 (*) 4.22 - 5.81 MIL/uL   Hemoglobin 11.7 (*) 13.0 - 17.0 g/dL   HCT 78.2 (*) 95.6 - 21.3 %   MCV 94.6  78.0 - 100.0 fL   MCH 31.6  26.0 - 34.0 pg   MCHC 33.4  30.0 - 36.0 g/dL   RDW 08.6  57.8 - 46.9 %   Platelets 146 (*) 150 - 400 K/uL   Neutrophils Relative % 73  43 - 77 %   Neutro Abs 5.3  1.7 - 7.7 K/uL   Lymphocytes Relative 15  12 - 46 %   Lymphs Abs 1.1  0.7 - 4.0 K/uL   Monocytes Relative 10  3 - 12 %   Monocytes Absolute 0.7  0.1 - 1.0 K/uL   Eosinophils Relative 2  0 - 5 %   Eosinophils Absolute 0.2  0.0 - 0.7 K/uL   Basophils Relative 1  0 - 1 %   Basophils Absolute 0.1  0.0 - 0.1 K/uL  COMPREHENSIVE METABOLIC PANEL      Result Value Range   Sodium 131 (*) 135 - 145 mEq/L   Potassium 4.7  3.5 - 5.1 mEq/L   Chloride 91 (*) 96 - 112 mEq/L   CO2 26  19 - 32 mEq/L   Glucose, Bld 225 (*) 70 - 99 mg/dL   BUN 31 (*) 6 - 23 mg/dL   Creatinine, Ser 6.29 (*) 0.50 - 1.35 mg/dL   Calcium 8.2 (*) 8.4 - 10.5  mg/dL   Total Protein 7.3  6.0 - 8.3 g/dL   Albumin 3.3 (*) 3.5 - 5.2 g/dL   AST 22  0 - 37 U/L   ALT 28  0 - 53 U/L   Alkaline Phosphatase 146 (*) 39 - 117 U/L   Total Bilirubin 0.2 (*) 0.3 - 1.2 mg/dL   GFR calc non Af Amer 6 (*) >90 mL/min   GFR calc Af Amer 6 (*) >90 mL/min  LIPASE, BLOOD      Result Value Range   Lipase 37  11 - 59 U/L   Laboratory interpretation all normal except chronic renal failure, mild hyponatremia and low chloride consistent with dehydration, hyperglycemia, mild anemia     Date: 08/02/2012  Rate: 80  Rhythm: normal sinus rhythm  QRS Axis: left  Intervals: normal  ST/T Wave abnormalities: normal  Conduction Disutrbances:none  Narrative Interpretation: LAE  Old EKG Reviewed: unchanged from 05/24/2011    1. Narcotic withdrawal   2. Nausea and vomiting    Discharge Medication List as of 08/02/2012 12:29 PM    START taking these medications   Details  ondansetron (ZOFRAN) 4 MG tablet Take 1 tablet (4 mg total) by mouth every 6 (six) hours., Starting 08/02/2012, Until Discontinued,  Print        Plan discharge   Devoria Albe, MD, FACEP   .  MDM  patient failed to mention when he was first seen that he had been taken off his narcotic pain medications abruptly and his symptoms are most likely from narcotic withdrawal. He was released to go see his PCP today to discuss his chronic pain medication.   I personally performed the services described in this documentation, which was scribed in my presence. The recorded information has been reviewed and considered.  Devoria Albe, MD, Armando Gang     [name], MD 08/02/12 [phone]

## 2012-08-02 NOTE — ED Notes (Signed)
D/c from [facility] x 3 days ago - reports n/v and epigastric pain since.

## 2012-08-11 DIAGNOSIS — R55 Syncope and collapse: Secondary | ICD-10-CM

## 2012-08-16 ENCOUNTER — Inpatient Hospital Stay (HOSPITAL_COMMUNITY)
Admission: EM | Admit: 2012-08-16 | Discharge: 2012-09-06 | DRG: 640 | Disposition: A | Discharge status: Other Acute Inpatient Hospital | Payer: Medicare Other | Attending: Internal Medicine | Admitting: Internal Medicine

## 2012-08-16 ENCOUNTER — Encounter (HOSPITAL_COMMUNITY): Payer: Self-pay | Admitting: Emergency Medicine

## 2012-08-16 DIAGNOSIS — N186 End stage renal disease: Secondary | ICD-10-CM

## 2012-08-16 DIAGNOSIS — N183 Chronic kidney disease, stage 3 unspecified: Secondary | ICD-10-CM

## 2012-08-16 DIAGNOSIS — R45851 Suicidal ideations: Secondary | ICD-10-CM

## 2012-08-16 DIAGNOSIS — Z9119 Patient's noncompliance with other medical treatment and regimen: Secondary | ICD-10-CM

## 2012-08-16 DIAGNOSIS — E1139 Type 2 diabetes mellitus with other diabetic ophthalmic complication: Secondary | ICD-10-CM | POA: Diagnosis present

## 2012-08-16 DIAGNOSIS — R112 Nausea with vomiting, unspecified: Secondary | ICD-10-CM | POA: Diagnosis present

## 2012-08-16 DIAGNOSIS — E1149 Type 2 diabetes mellitus with other diabetic neurological complication: Secondary | ICD-10-CM | POA: Diagnosis present

## 2012-08-16 DIAGNOSIS — Z91199 Patient's noncompliance with other medical treatment and regimen due to unspecified reason: Secondary | ICD-10-CM

## 2012-08-16 DIAGNOSIS — E872 Acidosis, unspecified: Secondary | ICD-10-CM | POA: Diagnosis not present

## 2012-08-16 DIAGNOSIS — K2990 Gastroduodenitis, unspecified, without bleeding: Secondary | ICD-10-CM | POA: Diagnosis present

## 2012-08-16 DIAGNOSIS — S98139A Complete traumatic amputation of one unspecified lesser toe, initial encounter: Secondary | ICD-10-CM

## 2012-08-16 DIAGNOSIS — Z933 Colostomy status: Secondary | ICD-10-CM

## 2012-08-16 DIAGNOSIS — E11319 Type 2 diabetes mellitus with unspecified diabetic retinopathy without macular edema: Secondary | ICD-10-CM | POA: Diagnosis present

## 2012-08-16 DIAGNOSIS — G894 Chronic pain syndrome: Secondary | ICD-10-CM | POA: Diagnosis present

## 2012-08-16 DIAGNOSIS — I252 Old myocardial infarction: Secondary | ICD-10-CM

## 2012-08-16 DIAGNOSIS — E871 Hypo-osmolality and hyponatremia: Secondary | ICD-10-CM | POA: Diagnosis present

## 2012-08-16 DIAGNOSIS — E1142 Type 2 diabetes mellitus with diabetic polyneuropathy: Secondary | ICD-10-CM | POA: Diagnosis present

## 2012-08-16 DIAGNOSIS — I10 Essential (primary) hypertension: Secondary | ICD-10-CM

## 2012-08-16 DIAGNOSIS — Z5900 Homelessness unspecified: Secondary | ICD-10-CM

## 2012-08-16 DIAGNOSIS — F411 Generalized anxiety disorder: Secondary | ICD-10-CM | POA: Diagnosis present

## 2012-08-16 DIAGNOSIS — G8929 Other chronic pain: Secondary | ICD-10-CM

## 2012-08-16 DIAGNOSIS — K2970 Gastritis, unspecified, without bleeding: Secondary | ICD-10-CM | POA: Diagnosis present

## 2012-08-16 DIAGNOSIS — R0600 Dyspnea, unspecified: Secondary | ICD-10-CM

## 2012-08-16 DIAGNOSIS — Z85038 Personal history of other malignant neoplasm of large intestine: Secondary | ICD-10-CM

## 2012-08-16 DIAGNOSIS — E875 Hyperkalemia: Principal | ICD-10-CM

## 2012-08-16 DIAGNOSIS — N2581 Secondary hyperparathyroidism of renal origin: Secondary | ICD-10-CM | POA: Diagnosis present

## 2012-08-16 DIAGNOSIS — Z794 Long term (current) use of insulin: Secondary | ICD-10-CM

## 2012-08-16 DIAGNOSIS — Z992 Dependence on renal dialysis: Secondary | ICD-10-CM

## 2012-08-16 DIAGNOSIS — I2510 Atherosclerotic heart disease of native coronary artery without angina pectoris: Secondary | ICD-10-CM | POA: Diagnosis present

## 2012-08-16 DIAGNOSIS — E119 Type 2 diabetes mellitus without complications: Secondary | ICD-10-CM

## 2012-08-16 DIAGNOSIS — I120 Hypertensive chronic kidney disease with stage 5 chronic kidney disease or end stage renal disease: Secondary | ICD-10-CM | POA: Diagnosis present

## 2012-08-16 DIAGNOSIS — Z720 Tobacco use: Secondary | ICD-10-CM

## 2012-08-16 DIAGNOSIS — F339 Major depressive disorder, recurrent, unspecified: Secondary | ICD-10-CM | POA: Diagnosis present

## 2012-08-16 DIAGNOSIS — K219 Gastro-esophageal reflux disease without esophagitis: Secondary | ICD-10-CM | POA: Diagnosis present

## 2012-08-16 DIAGNOSIS — D649 Anemia, unspecified: Secondary | ICD-10-CM | POA: Diagnosis present

## 2012-08-16 DIAGNOSIS — M899 Disorder of bone, unspecified: Secondary | ICD-10-CM | POA: Diagnosis present

## 2012-08-16 DIAGNOSIS — F17200 Nicotine dependence, unspecified, uncomplicated: Secondary | ICD-10-CM | POA: Diagnosis present

## 2012-08-16 LAB — CBC WITH DIFFERENTIAL/PLATELET
Basophils Absolute: 0.1 10*3/uL (ref 0.0–0.1)
Eosinophils Relative: 1 % (ref 0–5)
HCT: 39.4 % (ref 39.0–52.0)
Lymphocytes Relative: 13 % (ref 12–46)
MCHC: 33 g/dL (ref 30.0–36.0)
MCV: 95.4 fL (ref 78.0–100.0)
Monocytes Absolute: 0.7 10*3/uL (ref 0.1–1.0)
RDW: 14.2 % (ref 11.5–15.5)
WBC: 9 10*3/uL (ref 4.0–10.5)

## 2012-08-16 LAB — BASIC METABOLIC PANEL
BUN: 42 mg/dL — ABNORMAL HIGH (ref 6–23)
CO2: 26 mEq/L (ref 19–32)
Calcium: 9.3 mg/dL (ref 8.4–10.5)
Creatinine, Ser: 10.71 mg/dL — ABNORMAL HIGH (ref 0.50–1.35)

## 2012-08-16 MED ORDER — IBUPROFEN 600 MG PO TABS
600.0000 mg | ORAL_TABLET | Freq: Three times a day (TID) | ORAL | Status: DC | PRN
  Filled 2012-08-16 (×2): qty 1

## 2012-08-16 MED ORDER — INSULIN GLARGINE 100 UNIT/ML ~~LOC~~ SOLN
15.0000 [IU] | Freq: Every day | SUBCUTANEOUS | Status: DC
  Administered 2012-08-16: 15 [IU] via SUBCUTANEOUS
  Filled 2012-08-16 (×2): qty 0.15

## 2012-08-16 MED ORDER — ZOLPIDEM TARTRATE 5 MG PO TABS
10.0000 mg | ORAL_TABLET | Freq: Every evening | ORAL | Status: DC | PRN
  Administered 2012-09-02: 10 mg via ORAL
  Filled 2012-08-16 (×2): qty 2

## 2012-08-16 MED ORDER — SODIUM POLYSTYRENE SULFONATE 15 GM/60ML PO SUSP
45.0000 g | Freq: Once | ORAL | Status: AC
  Administered 2012-08-16: 45 g via ORAL
  Filled 2012-08-16: qty 120

## 2012-08-16 MED ORDER — ALUM & MAG HYDROXIDE-SIMETH 200-200-20 MG/5ML PO SUSP
30.0000 mL | ORAL | Status: DC | PRN
  Filled 2012-08-16: qty 30

## 2012-08-16 MED ORDER — NEPHRO-VITE 0.8 MG PO TABS
1.0000 | ORAL_TABLET | Freq: Every day | ORAL | Status: DC
  Administered 2012-08-19 – 2012-09-06 (×19): 1 via ORAL
  Filled 2012-08-16 (×37): qty 1

## 2012-08-16 MED ORDER — SODIUM POLYSTYRENE SULFONATE 15 GM/60ML PO SUSP
ORAL | Status: AC
  Filled 2012-08-16: qty 60

## 2012-08-16 MED ORDER — NICOTINE 21 MG/24HR TD PT24
21.0000 mg | MEDICATED_PATCH | Freq: Every day | TRANSDERMAL | Status: DC
  Administered 2012-08-16 – 2012-09-06 (×19): 21 mg via TRANSDERMAL
  Filled 2012-08-16 (×21): qty 1

## 2012-08-16 MED ORDER — INSULIN ASPART 100 UNIT/ML ~~LOC~~ SOLN: 4.0000 [IU] | Freq: Three times a day (TID) | SUBCUTANEOUS | Status: DC | PRN

## 2012-08-16 MED ORDER — DIAZEPAM 5 MG PO TABS
10.0000 mg | ORAL_TABLET | Freq: Two times a day (BID) | ORAL | Status: DC
  Administered 2012-08-16 – 2012-09-06 (×24): 10 mg via ORAL
  Filled 2012-08-16 (×12): qty 2
  Filled 2012-08-16: qty 1
  Filled 2012-08-16 (×6): qty 2
  Filled 2012-08-16: qty 1
  Filled 2012-08-16 (×12): qty 2

## 2012-08-16 MED ORDER — ACETAMINOPHEN 325 MG PO TABS: 650.0000 mg | ORAL_TABLET | ORAL | Status: DC | PRN

## 2012-08-16 MED ORDER — HYDROMORPHONE HCL 4 MG PO TABS
4.0000 mg | ORAL_TABLET | Freq: Two times a day (BID) | ORAL | Status: DC
  Administered 2012-08-16 – 2012-08-20 (×4): 4 mg via ORAL
  Filled 2012-08-16: qty 2
  Filled 2012-08-16 (×8): qty 1

## 2012-08-16 MED ORDER — ONDANSETRON HCL 4 MG PO TABS
4.0000 mg | ORAL_TABLET | Freq: Three times a day (TID) | ORAL | Status: DC | PRN
  Administered 2012-08-16 – 2012-08-17 (×2): 4 mg via ORAL
  Filled 2012-08-16 (×2): qty 1

## 2012-08-16 MED ORDER — AMLODIPINE BESYLATE 5 MG PO TABS
10.0000 mg | ORAL_TABLET | Freq: Every day | ORAL | Status: DC
  Administered 2012-08-16: 10 mg via ORAL
  Filled 2012-08-16 (×5): qty 2

## 2012-08-16 NOTE — ED Notes (Addendum)
Pt drowsy at time of medication administration. Given Kayexalte to drink. Verbalized understanding of needing to drink the medication in its entirety.
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
## 2012-08-16 NOTE — ED Notes (Signed)
Pt reports "i only urinate once every two weeks." EDP aware and reported if able to obtain a sample good but if not that's okay too.

## 2012-08-16 NOTE — ED Notes (Signed)
Pt nauseous when being given medications. Will hold on other meds until nausea medication begins to take affect.
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
## 2012-08-17 ENCOUNTER — Emergency Department (HOSPITAL_COMMUNITY): Payer: Medicare Other

## 2012-08-17 ENCOUNTER — Encounter (HOSPITAL_COMMUNITY): Payer: Self-pay | Admitting: *Deleted

## 2012-08-17 DIAGNOSIS — Z720 Tobacco use: Secondary | ICD-10-CM | POA: Diagnosis present

## 2012-08-17 DIAGNOSIS — I10 Essential (primary) hypertension: Secondary | ICD-10-CM

## 2012-08-17 DIAGNOSIS — N183 Chronic kidney disease, stage 3 unspecified: Secondary | ICD-10-CM

## 2012-08-17 DIAGNOSIS — R112 Nausea with vomiting, unspecified: Secondary | ICD-10-CM | POA: Diagnosis present

## 2012-08-17 DIAGNOSIS — N186 End stage renal disease: Secondary | ICD-10-CM

## 2012-08-17 DIAGNOSIS — E871 Hypo-osmolality and hyponatremia: Secondary | ICD-10-CM | POA: Diagnosis present

## 2012-08-17 DIAGNOSIS — Z992 Dependence on renal dialysis: Secondary | ICD-10-CM | POA: Diagnosis present

## 2012-08-17 DIAGNOSIS — E875 Hyperkalemia: Secondary | ICD-10-CM | POA: Diagnosis present

## 2012-08-17 DIAGNOSIS — R45851 Suicidal ideations: Secondary | ICD-10-CM

## 2012-08-17 DIAGNOSIS — E119 Type 2 diabetes mellitus without complications: Secondary | ICD-10-CM

## 2012-08-17 LAB — CBC
HCT: 39.1 % (ref 39.0–52.0)
MCV: 97.3 fL (ref 78.0–100.0)
RBC: 4.02 MIL/uL — ABNORMAL LOW (ref 4.22–5.81)
WBC: 11.1 10*3/uL — ABNORMAL HIGH (ref 4.0–10.5)

## 2012-08-17 LAB — BASIC METABOLIC PANEL
BUN: 50 mg/dL — ABNORMAL HIGH (ref 6–23)
Chloride: 94 mEq/L — ABNORMAL LOW (ref 96–112)
GFR calc Af Amer: 5 mL/min — ABNORMAL LOW (ref >90)
GFR calc non Af Amer: 4 mL/min — ABNORMAL LOW (ref >90)
Glucose, Bld: 137 mg/dL — ABNORMAL HIGH (ref 70–99)
Potassium: 6.7 mEq/L (ref 3.5–5.1)
Sodium: 133 mEq/L — ABNORMAL LOW (ref 135–145)

## 2012-08-17 LAB — GLUCOSE, CAPILLARY
Glucose-Capillary: 132 mg/dL — ABNORMAL HIGH (ref 70–99)
Glucose-Capillary: 134 mg/dL — ABNORMAL HIGH (ref 70–99)
Glucose-Capillary: 165 mg/dL — ABNORMAL HIGH (ref 70–99)
Glucose-Capillary: 87 mg/dL (ref 70–99)
Glucose-Capillary: 108 mg/dL — ABNORMAL HIGH (ref 70–99)
Glucose-Capillary: 104 mg/dL — ABNORMAL HIGH (ref 70–99)

## 2012-08-17 LAB — HEMOGLOBIN A1C: Mean Plasma Glucose: 163 mg/dL — ABNORMAL HIGH (ref <117)

## 2012-08-17 MED ORDER — PANTOPRAZOLE SODIUM 40 MG PO TBEC
40.0000 mg | DELAYED_RELEASE_TABLET | Freq: Every day | ORAL | Status: DC
  Administered 2012-08-19 – 2012-09-06 (×19): 40 mg via ORAL
  Filled 2012-08-17 (×21): qty 1

## 2012-08-17 MED ORDER — HEPARIN SODIUM (PORCINE) 1000 UNIT/ML DIALYSIS
20.0000 [IU]/kg | INTRAMUSCULAR | Status: DC | PRN
  Filled 2012-08-17: qty 2

## 2012-08-17 MED ORDER — HEPARIN SODIUM (PORCINE) 1000 UNIT/ML DIALYSIS
1000.0000 [IU] | INTRAMUSCULAR | Status: DC | PRN
  Filled 2012-08-17: qty 1

## 2012-08-17 MED ORDER — PENTAFLUOROPROP-TETRAFLUOROETH EX AERO
1.0000 "application " | INHALATION_SPRAY | CUTANEOUS | Status: DC | PRN
  Filled 2012-08-17: qty 103.5

## 2012-08-17 MED ORDER — HEPARIN SODIUM (PORCINE) 1000 UNIT/ML DIALYSIS
300.0000 [IU] | INTRAMUSCULAR | Status: DC | PRN
  Filled 2012-08-17: qty 1

## 2012-08-17 MED ORDER — ONDANSETRON HCL 4 MG/2ML IJ SOLN
4.0000 mg | Freq: Four times a day (QID) | INTRAMUSCULAR | Status: DC | PRN
  Administered 2012-08-17 – 2012-08-21 (×3): 4 mg via INTRAVENOUS
  Filled 2012-08-17 (×3): qty 2

## 2012-08-17 MED ORDER — SODIUM CHLORIDE 0.9 % IV SOLN: 100.0000 mL | INTRAVENOUS | Status: DC | PRN

## 2012-08-17 MED ORDER — INSULIN GLARGINE 100 UNIT/ML ~~LOC~~ SOLN
9.0000 [IU] | Freq: Every day | SUBCUTANEOUS | Status: DC
  Administered 2012-08-17: 9 [IU] via SUBCUTANEOUS
  Filled 2012-08-17: qty 0.09

## 2012-08-17 MED ORDER — ONDANSETRON HCL 4 MG PO TABS
4.0000 mg | ORAL_TABLET | Freq: Four times a day (QID) | ORAL | Status: DC | PRN
  Filled 2012-08-17: qty 1

## 2012-08-17 MED ORDER — NEPRO/CARBSTEADY PO LIQD: 237.0000 mL | ORAL | Status: DC | PRN

## 2012-08-17 MED ORDER — HEPARIN SODIUM (PORCINE) 5000 UNIT/ML IJ SOLN
5000.0000 [IU] | Freq: Three times a day (TID) | INTRAMUSCULAR | Status: DC
  Administered 2012-08-17 – 2012-09-06 (×53): 5000 [IU] via SUBCUTANEOUS
  Filled 2012-08-17 (×59): qty 1

## 2012-08-17 MED ORDER — SODIUM CHLORIDE 0.9 % IJ SOLN
3.0000 mL | INTRAMUSCULAR | Status: DC | PRN
  Administered 2012-08-18 (×2): 3 mL via INTRAVENOUS

## 2012-08-17 MED ORDER — SODIUM CHLORIDE 0.9 % IV SOLN: 250.0000 mL | INTRAVENOUS | Status: DC | PRN

## 2012-08-17 MED ORDER — DEXTROSE 50 % IV SOLN
INTRAVENOUS | Status: AC
  Administered 2012-08-17: 50 mL via INTRAVENOUS
  Filled 2012-08-17: qty 50

## 2012-08-17 MED ORDER — SUCRALFATE 1 G PO TABS
1.0000 g | ORAL_TABLET | Freq: Three times a day (TID) | ORAL | Status: DC
  Administered 2012-08-17 – 2012-09-06 (×71): 1 g via ORAL
  Filled 2012-08-17 (×79): qty 1

## 2012-08-17 MED ORDER — INSULIN ASPART 100 UNIT/ML ~~LOC~~ SOLN
SUBCUTANEOUS | Status: AC
  Administered 2012-08-17: 10 [IU] via INTRAVENOUS
  Filled 2012-08-17: qty 1

## 2012-08-17 MED ORDER — INSULIN ASPART 100 UNIT/ML IV SOLN: 10.0000 [IU] | Freq: Once | INTRAVENOUS | Status: AC

## 2012-08-17 MED ORDER — INSULIN ASPART 100 UNIT/ML ~~LOC~~ SOLN
0.0000 [IU] | Freq: Three times a day (TID) | SUBCUTANEOUS | Status: DC
  Administered 2012-08-19: 3 [IU] via SUBCUTANEOUS
  Administered 2012-08-19: 11 [IU] via SUBCUTANEOUS
  Administered 2012-08-20 (×2): 3 [IU] via SUBCUTANEOUS
  Administered 2012-08-20 – 2012-08-21 (×2): 5 [IU] via SUBCUTANEOUS
  Administered 2012-08-21: 3 [IU] via SUBCUTANEOUS
  Administered 2012-08-22: 2 [IU] via SUBCUTANEOUS
  Administered 2012-08-22: 5 [IU] via SUBCUTANEOUS
  Administered 2012-08-22: 3 [IU] via SUBCUTANEOUS
  Administered 2012-08-23: 2 [IU] via SUBCUTANEOUS
  Administered 2012-08-23 – 2012-08-24 (×2): 3 [IU] via SUBCUTANEOUS
  Administered 2012-08-25: 2 [IU] via SUBCUTANEOUS
  Administered 2012-08-25 (×2): 3 [IU] via SUBCUTANEOUS
  Administered 2012-08-26: 2 [IU] via SUBCUTANEOUS
  Administered 2012-08-27 – 2012-08-28 (×4): 3 [IU] via SUBCUTANEOUS
  Administered 2012-08-28 (×2): 2 [IU] via SUBCUTANEOUS
  Administered 2012-08-29: 17:00:00 via SUBCUTANEOUS
  Administered 2012-08-31 (×2): 3 [IU] via SUBCUTANEOUS
  Administered 2012-08-31 – 2012-09-01 (×2): 2 [IU] via SUBCUTANEOUS
  Administered 2012-09-01: 3 [IU] via SUBCUTANEOUS
  Administered 2012-09-03: 11 [IU] via SUBCUTANEOUS
  Administered 2012-09-03: 5 [IU] via SUBCUTANEOUS
  Administered 2012-09-04: 1 [IU] via SUBCUTANEOUS
  Administered 2012-09-05: 5 [IU] via SUBCUTANEOUS
  Administered 2012-09-05: 3 [IU] via SUBCUTANEOUS
  Administered 2012-09-06: 2 [IU] via SUBCUTANEOUS

## 2012-08-17 MED ORDER — LIDOCAINE-PRILOCAINE 2.5-2.5 % EX CREA
1.0000 "application " | TOPICAL_CREAM | CUTANEOUS | Status: DC | PRN
  Filled 2012-08-17: qty 5

## 2012-08-17 MED ORDER — ALTEPLASE 2 MG IJ SOLR
2.0000 mg | Freq: Once | INTRAMUSCULAR | Status: AC | PRN
  Filled 2012-08-17: qty 2

## 2012-08-17 MED ORDER — SODIUM CHLORIDE 0.9 % IJ SOLN
3.0000 mL | Freq: Two times a day (BID) | INTRAMUSCULAR | Status: DC
  Administered 2012-08-17 – 2012-09-01 (×25): 3 mL via INTRAVENOUS

## 2012-08-17 MED ORDER — PNEUMOCOCCAL VAC POLYVALENT 25 MCG/0.5ML IJ INJ
0.5000 mL | INJECTION | INTRAMUSCULAR | Status: AC
  Filled 2012-08-17: qty 0.5

## 2012-08-17 MED ORDER — SODIUM CHLORIDE 0.9 % IJ SOLN
3.0000 mL | Freq: Two times a day (BID) | INTRAMUSCULAR | Status: DC
  Administered 2012-08-20 – 2012-09-01 (×6): 3 mL via INTRAVENOUS

## 2012-08-17 MED ORDER — DEXTROSE 50 % IV SOLN: 1.0000 | Freq: Once | INTRAVENOUS | Status: AC

## 2012-08-17 MED ORDER — LIDOCAINE HCL (PF) 1 % IJ SOLN: 5.0000 mL | INTRAMUSCULAR | Status: DC | PRN

## 2012-08-17 NOTE — ED Notes (Signed)
Report given to [facility] on 200

## 2012-08-17 NOTE — ED Provider Notes (Signed)
Pt still reports SI He also reports he feels SOB CXR/EKG ordered.  Repeat BMP and BNP ordered Pt may need dialysis today and possibly medical admission Will continue to monitor and place on cardiac monitor    Date: 08/17/2012 0802am  Rate: 81  Rhythm: normal sinus rhythm  QRS Axis: left  Intervals: normal  ST/T Wave abnormalities: nonspecific ST changes  Conduction Disutrbances:none  Narrative Interpretation:   Old EKG Reviewed: unchanged from 08/02/12    Joya Gaskins, MD 08/17/12 [phone]

## 2012-08-17 NOTE — Consult Note (Signed)
Reason for Consult: End-stage renal disease and hyperkalemia Referring Physician: Dr Jorene Minors is an 52 y.o. male.  HPI: He is a patient who has history of hypertension, diabetes and end-stage renal disease on maintenance hemodialysis presently had came to the emergency room with complaints of no  Place tio ago and want to see a  psychiatrist. Patient who is [facility] after being admitted for suicidal ideation. I saw yesterday because of his hyperkalemia but refused to dialysis and was managed with Kyoxalate. Patient noncompliant with his dialysis and come to the unit on when necessary basis. Also the time he doesn't complaint his treatment. Presently he denies any nausea vomiting.  Past Medical History  Diagnosis Date  . Diabetes mellitus   . Hypertension   . GERD (gastroesophageal reflux disease)   . Retinopathy   . Peripheral neuropathy   . Chronic kidney disease   . Chest pain     a. 05/2011 Cath: nonobs dzs.  . Myocardial infarction 2000  . Depression   . Liver disease   . Cancer     Colon    Past Surgical History  Procedure Laterality Date  . Colostomy  Ileostomy    colon cancer  . Amputation      left pinky toe  . Esophagogastroduodenoscopy  05/23/2011    Procedure: ESOPHAGOGASTRODUODENOSCOPY (EGD);  Surgeon: Theda Belfast, MD;  Location: [facility] ENDOSCOPY;  Service: Endoscopy;  Laterality: N/A;  . Colon surgery  2009  . Cardiac catheterization  2012  . Eye surgery      Family History  Problem Relation Age of Onset  . Cancer Mother   . Heart disease Father   . Cancer Father     Social History:  reports that he has been smoking Cigarettes and Cigars.  He has a 15 pack-year smoking history. He has never used smokeless tobacco. He reports that he does not drink alcohol or use illicit drugs.  Allergies:  Allergies  Allergen Reactions  . Codeine Swelling    Medications: I have reviewed the patient's current medications.  Results for orders placed  during the hospital encounter of 08/16/12 (from the past 48 hour(s))  CBC WITH DIFFERENTIAL     Status: Abnormal   Collection Time    08/16/12  5:25 PM      Result Value Range   WBC 9.0  4.0 - 10.5 K/uL   RBC 4.13 (*) 4.22 - 5.81 MIL/uL   Hemoglobin 13.0  13.0 - 17.0 g/dL   HCT 40.9  81.1 - 91.4 %   MCV 95.4  78.0 - 100.0 fL   MCH 31.5  26.0 - 34.0 pg   MCHC 33.0  30.0 - 36.0 g/dL   RDW 78.2  95.6 - 21.3 %   Platelets 149 (*) 150 - 400 K/uL   Neutrophils Relative % 78 (*) 43 - 77 %   Neutro Abs 7.0  1.7 - 7.7 K/uL   Lymphocytes Relative 13  12 - 46 %   Lymphs Abs 1.2  0.7 - 4.0 K/uL   Monocytes Relative 8  3 - 12 %   Monocytes Absolute 0.7  0.1 - 1.0 K/uL   Eosinophils Relative 1  0 - 5 %   Eosinophils Absolute 0.1  0.0 - 0.7 K/uL   Basophils Relative 1  0 - 1 %   Basophils Absolute 0.1  0.0 - 0.1 K/uL  BASIC METABOLIC PANEL     Status: Abnormal   Collection Time  08/16/12  5:25 PM      Result Value Range   Sodium 135  135 - 145 mEq/L   Potassium 5.8 (*) 3.5 - 5.1 mEq/L   Chloride 95 (*) 96 - 112 mEq/L   CO2 26  19 - 32 mEq/L   Glucose, Bld 250 (*) 70 - 99 mg/dL   BUN 42 (*) 6 - 23 mg/dL   Creatinine, Ser 40.98 (*) 0.50 - 1.35 mg/dL   Calcium 9.3  8.4 - 11.9 mg/dL   GFR calc non Af Amer 5 (*) >90 mL/min   GFR calc Af Amer 6 (*) >90 mL/min   Comment:            The eGFR has been calculated     using the CKD EPI equation.     This calculation has not been     validated in all clinical     situations.     eGFR's persistently     <90 mL/min signify     possible Chronic Kidney Disease.  GLUCOSE, CAPILLARY     Status: Abnormal   Collection Time    08/17/12  7:25 AM      Result Value Range   Glucose-Capillary 132 (*) 70 - 99 mg/dL  BASIC METABOLIC PANEL     Status: Abnormal   Collection Time    08/17/12  7:46 AM      Result Value Range   Sodium 133 (*) 135 - 145 mEq/L   Potassium 6.7 (*) 3.5 - 5.1 mEq/L   Comment: CRITICAL RESULT CALLED TO, READ BACK BY AND  VERIFIED WITH:     YODER,T AT 8:20AM ON 08/17/12 BY FESTERMAN,C   Chloride 94 (*) 96 - 112 mEq/L   CO2 29  19 - 32 mEq/L   Glucose, Bld 137 (*) 70 - 99 mg/dL   BUN 50 (*) 6 - 23 mg/dL   Creatinine, Ser 14.78 (*) 0.50 - 1.35 mg/dL   Calcium 9.1  8.4 - 29.5 mg/dL   GFR calc non Af Amer 4 (*) >90 mL/min   GFR calc Af Amer 5 (*) >90 mL/min   Comment:            The eGFR has been calculated     using the CKD EPI equation.     This calculation has not been     validated in all clinical     situations.     eGFR's persistently     <90 mL/min signify     possible Chronic Kidney Disease.  PRO B NATRIURETIC PEPTIDE     Status: Abnormal   Collection Time    08/17/12  7:46 AM      Result Value Range   Pro B Natriuretic peptide (BNP) 3324.0 (*) 0 - 125 pg/mL  CBC     Status: Abnormal   Collection Time    08/17/12  7:46 AM      Result Value Range   WBC 11.1 (*) 4.0 - 10.5 K/uL   RBC 4.02 (*) 4.22 - 5.81 MIL/uL   Hemoglobin 12.6 (*) 13.0 - 17.0 g/dL   HCT 62.1  30.8 - 65.7 %   MCV 97.3  78.0 - 100.0 fL   MCH 31.3  26.0 - 34.0 pg   MCHC 32.2  30.0 - 36.0 g/dL   RDW 84.6  96.2 - 95.2 %   Platelets 167  150 - 400 K/uL  GLUCOSE, CAPILLARY     Status: Abnormal   Collection Time  08/17/12  9:03 AM      Result Value Range   Glucose-Capillary 134 (*) 70 - 99 mg/dL   Comment 1 Notify RN     Comment 2 Documented in Chart    GLUCOSE, CAPILLARY     Status: Abnormal   Collection Time    08/17/12 10:03 AM      Result Value Range   Glucose-Capillary 165 (*) 70 - 99 mg/dL   Comment 1 Documented in Chart     Comment 2 Notify RN    GLUCOSE, CAPILLARY     Status: None   Collection Time    08/17/12 11:41 AM      Result Value Range   Glucose-Capillary 87  70 - 99 mg/dL   Comment 1 Notify RN     Comment 2 Documented in Chart      Dg Chest 2 View  08/17/2012   *RADIOLOGY REPORT*  Clinical Data: Nausea and hypertension.  CHEST - 2 VIEW  Comparison: 08/11/2012  Findings: Lungs hyperinflated and  there may be a component of COPD. No infiltrate, edema, nodule or pleural effusion is identified. The heart size is within normal limits.  Stable calcified granuloma of the left upper lobe and associated calcified left hilar lymph nodes consistent with prior granulomatous disease.  IMPRESSION: Possible underlying COPD.  Evidence of prior granulomatous disease. No acute findings.   Original Report Authenticated By: Irish Lack, M.D.    Review of Systems  Constitutional: Negative for fever and chills.  Respiratory: Positive for cough and sputum production. Negative for shortness of breath.   Cardiovascular: Negative for chest pain and leg swelling.  Gastrointestinal: Positive for nausea. Negative for vomiting.  Musculoskeletal: Positive for back pain and joint pain.  Neurological: Positive for dizziness.  Psychiatric/Behavioral: Positive for depression and suicidal ideas.   Blood pressure 115/64, pulse 74, temperature 98 F (36.7 C), temperature source Oral, resp. rate 18, height 6' (1.829 m), weight 56.246 kg (124 lb), SpO2 98.00%. Physical Exam  Eyes: No scleral icterus.  Neck: No JVD present.  Cardiovascular: Normal rate and regular rhythm.  Exam reveals friction rub.   Respiratory: No respiratory distress. He has no wheezes.  GI: He exhibits no distension. There is no tenderness.  Musculoskeletal: He exhibits no edema.    Assessment/Plan: Problem #1 hyperkalemia this is secondary to high potassium intake and also noncompliance with dialysis. Problem #2 end-stage Reynolds is Problem #3 hypertension Problem #4 diabetes Problem #5 colon cancer status post surgery with colostomy Problem #6 diabetic retinopathy his vision is impaired Problem #7 chronic back pain and pain medication seeking behavior Problem #8 history of depression, suicide ideation Problem #9 metabolic bone disease. Plan: We'll make arrangements for patient to get dialysis today We'll use  1 K/2 0.5 calcium as for 3  hours and half if patient is going to stay We'll check his basic metabolic panel, phosphorus in the morning.  Daymein Nunnery S 08/17/2012, 3:17 PM

## 2012-08-17 NOTE — ED Notes (Signed)
Patient is unable to give a urine sample at this time.  

## 2012-08-17 NOTE — H&P (Signed)
[facility] History and Physical  Joe Vincent YNW:[id number] DOB: [birth year] DOA: 08/16/2012  Referring physician:  PCP: [name], MD  Specialists:   Chief Complaint: nausea/vomiting shortness of breath  HPI: Joe Vincent is a 52 y.o. male with past medical history that includes end-stage renal disease on dialysis Tuesday Thursday and Saturdays, CAD, diabetes, hypertension, gastritis, colon cancer, presents to the emergency room with a chief complaint of shortness of breath. Information is obtained from the patient. He reports that he was discharged from [facility] on yesterday after a five-day stay. He reports he was staying with friends but they were "doing things I don't do" so he left. He states that during his hospitalization at [facility] he was supposed to be transferred to behavioral health for an inpatient admission do to persistence suicide ideation however there was not a bed available so they discharged him. He reports that he was instructed to come to the emergency room as "there was a bed in [place]. Chart review does indicate several admissions to [facility] there is mention on his most recent hospitalization that he expressed intentions to hurt himself that he was discharged and had to go back to living in his car with no air conditioning. He reports persistent suicide ideation do to the constant rejection he feels from family friends and community. He states prior to his presentation to the emergency room yesterday he was feeling some intermittent shortness of breath along with persistent nausea and vomiting. He states he's had 2-3 episodes of emesis. Denies coffee ground emesis. He denies chest pain palpitations headache visual disturbances. Lab work in the emergency room is significant for sodium of 133 potassium of 6.7 chloride 94 creatinine 11.9, proBNP 3324.0, CBG 250. EKG yields sinus rhythm with 1st degree A-V block Left axis deviation Pulmonary disease  pattern Septal infarct , age undetermined Abnormal ECG When compared with ECG of 02-Aug-2012 07:55,Septal infarct is now Present Inverted T waves have replaced nonspecific T wave abnormality in Lateral leads.chest x-ray yields possible underlying COPD. Evidence of prior granulomatous disease. No acute findings. While in the emergency room last evening patient was seen by Dr. Rhodia Albright who requested to be notified if patient was in the ED still today needing dialysis. Of note patient was dialyzed last Friday and Saturday. Symptoms came on gradually have persisted characterized as moderate. [facility] are asked to admit  Review of Systems: The patient denies , fever, weight loss,, vision loss, decreased hearing, hoarseness, chest pain, syncope,  peripheral edema, balance deficits, hemoptysis, abdominal pain, melena, hematochezia, severe indigestion/heartburn, hematuria, incontinence, genital sores, muscle weakness, suspicious skin lesions, transient blindness, difficulty walking, depression, unusual weight change, abnormal bleeding, enlarged lymph nodes, angioedema, and breast masses.    Past Medical History  Diagnosis Date  . Diabetes mellitus   . Hypertension   . GERD (gastroesophageal reflux disease)   . Retinopathy   . Peripheral neuropathy   . Chronic kidney disease   . Cancer     Colon  . Chest pain     a. 05/2011 Cath: nonobs dzs.  . Myocardial infarction 2000  . Depression   . Liver disease    Past Surgical History  Procedure Laterality Date  . Colostomy  Ileostomy    colon cancer  . Amputation      left pinky toe  . Esophagogastroduodenoscopy  05/23/2011    Procedure: ESOPHAGOGASTRODUODENOSCOPY (EGD);  Surgeon: Theda Belfast, MD;  Location: [facility] ENDOSCOPY;  Service: Endoscopy;  Laterality: N/A;  . Colon surgery  2009  . Cardiac catheterization  2012  . Eye surgery     Social History:  reports that he has been smoking Cigarettes and Cigars.  He has a 15 pack-year smoking  history. He has never used smokeless tobacco. He reports that he does not drink alcohol or use illicit drugs. Patient is currently homeless and lives in his car. He states he had Medicaid at one time and then lost it. He is fairly independent with ADLs.  Allergies  Allergen Reactions  . Codeine Swelling    Family History  Problem Relation Age of Onset  . Cancer Mother   . Heart disease Father   . Cancer Father    he has 5 siblings whose collective past medical history is positive for breast cancer, MI, diabetes, hypertension  Prior to Admission medications   Medication Sig Start Date End Date Taking? Authorizing Provider  amLODipine (NORVASC) 10 MG tablet Take 10 mg by mouth daily.   Yes Historical Provider, MD  insulin aspart (NOVOLOG) 100 UNIT/ML injection Inject 4-12 Units into the skin 3 (three) times daily as needed for high blood sugar (Uses a sliding scale at home).   Yes Historical Provider, MD  insulin glargine (LANTUS) 100 UNIT/ML injection Inject 15 Units into the skin at bedtime.    Yes Historical Provider, MD  multivitamin (RENA-VIT) TABS tablet Take 1 tablet by mouth daily.   Yes Historical Provider, MD   Physical Exam: Filed Vitals:   08/16/12 1710 08/16/12 2236 08/17/12 0651 08/17/12 0935  BP: 118/81 171/74 94/53 98/62   Pulse: 114 82 70 82  Temp: 98.3 F (36.8 C)   98.2 F (36.8 C)  TempSrc: Oral   Oral  Resp:  18 18   Height: 6' (1.829 m)     Weight: 58.145 kg (128 lb 3 oz)     SpO2: 93% 97% 100% 100%     General:  Slightly thin alert no acute distress  Eyes: PERR LA, EOMI  ENT: Ears clear nose without drainage oropharynx without erythema or exudate mucous membranes of his mouth are pink slightly dry very poor dentition  Neck: Supple no lymphadenopathy full range of motion  Cardiovascular: Regular rate and rhythm no murmur gallop or rub no lower extremity edema  Respiratory: Normal effort breath sounds distant but clear to auscultation bilaterally no  wheeze no rhonchi no crackles  Abdomen: Flat soft positive bowel sounds nondistended no mass organomegaly noted  Skin: Warm and dry no rash no lesion  Musculoskeletal: No clubbing no cyanosis joints without erythema or swelling nontender to palpation  Psychiatric: Somewhat anxious but cooperative verbalizing desire to harm himself  Neurologic: Cranial nerves II through XII grossly intact speech clear facial symmetry  Labs on Admission:  Basic Metabolic Panel:  Recent Labs Lab 08/16/12 1725 08/17/12 0746  NA 135 133*  K 5.8* 6.7*  CL 95* 94*  CO2 26 29  GLUCOSE 250* 137*  BUN 42* 50*  CREATININE 10.71* 11.90*  CALCIUM 9.3 9.1   Liver Function Tests: No results found for this basename: AST, ALT, ALKPHOS, BILITOT, PROT, ALBUMIN,  in the last 168 hours No results found for this basename: LIPASE, AMYLASE,  in the last 168 hours No results found for this basename: AMMONIA,  in the last 168 hours CBC:  Recent Labs Lab 08/16/12 1725 08/17/12 0746  WBC 9.0 11.1*  NEUTROABS 7.0  --   HGB 13.0 12.6*  HCT 39.4 39.1  MCV 95.4 97.3  PLT 149* 167   Cardiac  Enzymes: No results found for this basename: CKTOTAL, CKMB, CKMBINDEX, TROPONINI,  in the last 168 hours  BNP (last 3 results)  Recent Labs  08/17/12 0746  PROBNP 3324.0*   CBG:  Recent Labs Lab 08/17/12 0725 08/17/12 0903  GLUCAP 132* 134*    Radiological Exams on Admission: Dg Chest 2 View  08/17/2012   *RADIOLOGY REPORT*  Clinical Data: Nausea and hypertension.  CHEST - 2 VIEW  Comparison: 08/11/2012  Findings: Lungs hyperinflated and there may be a component of COPD. No infiltrate, edema, nodule or pleural effusion is identified. The heart size is within normal limits.  Stable calcified granuloma of the left upper lobe and associated calcified left hilar lymph nodes consistent with prior granulomatous disease.  IMPRESSION: Possible underlying COPD.  Evidence of prior granulomatous disease. No acute findings.    Original Report Authenticated By: Irish Lack, M.D.    EKG: Independently reviewed. See above Assessment/Plan Principal Problem:   ESRD needing dialysis: Limit to telemetry. Will request nephrology consult for dialysis. Of note patient did have an extra day of dialysis yesterday however his potassium remains elevated at 6.7. He also has a proBNP as noted above. Will monitor closely Active Problems: Nausea and vomiting: Likely related to #1 in the setting also of a history of gastritis. Will provide clear liquid diet as well as anti-emetic. Provide PPI and Carafate as chart review indicates this has helped in the past.    Hyperkalemia: Related to #1. Patient due for dialysis today. Have requested Nephrology consult for same    Hyponatremia: Likely related to #1 and #2. Currently mild. Will provide clear liquid diet. Recheck in the a.m.  Suicide ideation: Patient continues to verbalize intentions to hurt himself. History of depression and suicide ideation. Last hospitalization it was determined patient would benefit from inpatient behavioral health admission however beds unavailable. Patient seen by behavioral health assessment person last evening and recommendation was referral to [facility]. Will request social work consult to facilitate inpatient transfer when medically ready. Place on suicide precautions.      Hypertension: Only fair control with systolic blood pressure range 16-109. Only antihypertensive patient is on is Norvasc. Will continue that with parameters. Will monitor closely     Gastritis: History of same as well as esophagitis per EGD done in April of 2013. We'll provide PPI and Carafate. Clear liquid diet do to #2.    CAD (coronary artery disease): Patient with cardiac cath done in April 2013. Predominantly nonobstructive and branch vessel disease. No chest pain.    CKD (chronic kidney disease) see #1.      DM (diabetes mellitus): Will check  hemoglobin A 1C. Will use sliding scale insulin for optimal glycemic control. Will continue patient's home Lantus at half the dose. Will be on clear liquids do to problem #2. Will monitor close    Tobacco abuse: Patient counseled regarding smoking cessation.   Nephrology Dr. Lynder Parents Code Status: full Family Communication: none available Disposition Plan: Will need transfer to inpatient behavioral health  Time spent: 70 minute  Gwenyth Bender [facility] Pager [phone]  If 7PM-7AM, please contact night-coverage [email or web address] Password [facility] 08/17/2012, 10:00 AM  Attending: Patient seen and examined. He will need hemodialysis based on hyperkalemia and worsening renal failure. He is clinically not in heart failure despite elevated BNP. Once his medical problems are stable, which is likely tomorrow, he will be medically stable to transfer to inpatient behavioral health.

## 2012-08-17 NOTE — Plan of Care (Signed)
Problem: Phase I Progression Outcomes Goal: Initial discharge plan identified Outcome: Completed/Met Date Met:  08/17/12 Behavioral health once medically clear

## 2012-08-17 NOTE — Procedures (Signed)
    HEMODIALYSIS TREATMENT NOTE:  3.5 hour HD session completed via right upper arm AVF (15g/antegrade).  Dialyzed for 2 hours on 1K, 2.5Ca dialysate, then dialyzed remaining 1.5 hours on 2K, 2.5Ca dialysate.  Goal was not met:  BP was unable to tolerated removal of 2.5 liters as ordered.  Net UF 1.4 liters with 1 hour 14 minutes of interrupted ultrafiltration.  Pt was asymptomatic with low BP.  All blood was reinfused. Hemostasis was achieved within 15 minutes.  Report given to Helmut Muster, Charity fundraiser.  Lamone Ferrelli L. Dayra Rapley, RN, CDN

## 2012-08-17 NOTE — ED Notes (Signed)
Lab called critical potassium 6.7, dr. Bebe Shaggy notified.

## 2012-08-17 NOTE — Progress Notes (Signed)
Patient arrived to room with suicide sitter.  Pt belongings searched, no weapons found, ileostomy supplies in patient home bag, clothing, glasses, and cell phone. Patient states he did have a pocket knife, which was given to security in ED. Patient dressed in paper gowns.

## 2012-08-17 NOTE — ED Notes (Addendum)
Pt still expressing thoughts of running his car into a tree,pt given breakfast tray, bsl 132, pt states that he will try to eat but does not feel like eating this am , Dr. Bebe Shaggy notified, advised to hold pt's morning insulin for sliding coverage if pt does not eat well.

## 2012-08-17 NOTE — ED Notes (Signed)
Dr Gosrani at bedside,  

## 2012-08-17 NOTE — ED Notes (Signed)
Pt unable to eat breakfast due to nausea, zofran given as ordered prn

## 2012-08-17 NOTE — ED Notes (Signed)
Dr Wickline at bedside,  

## 2012-08-18 DIAGNOSIS — E875 Hyperkalemia: Principal | ICD-10-CM

## 2012-08-18 LAB — GLUCOSE, CAPILLARY
Glucose-Capillary: 61 mg/dL — ABNORMAL LOW (ref 70–99)
Glucose-Capillary: 113 mg/dL — ABNORMAL HIGH (ref 70–99)
Glucose-Capillary: 109 mg/dL — ABNORMAL HIGH (ref 70–99)
Glucose-Capillary: 179 mg/dL — ABNORMAL HIGH (ref 70–99)

## 2012-08-18 LAB — COMPREHENSIVE METABOLIC PANEL
ALT: 34 U/L (ref 0–53)
AST: 41 U/L — ABNORMAL HIGH (ref 0–37)
CO2: 32 mEq/L (ref 19–32)
Calcium: 9.1 mg/dL (ref 8.4–10.5)
Creatinine, Ser: 7.02 mg/dL — ABNORMAL HIGH (ref 0.50–1.35)
GFR calc Af Amer: 9 mL/min — ABNORMAL LOW (ref >90)
GFR calc non Af Amer: 8 mL/min — ABNORMAL LOW (ref >90)
Sodium: 137 mEq/L (ref 135–145)
Total Protein: 8.1 g/dL (ref 6.0–8.3)

## 2012-08-18 LAB — CBC
HCT: 38.6 % — ABNORMAL LOW (ref 39.0–52.0)
Hemoglobin: 12.5 g/dL — ABNORMAL LOW (ref 13.0–17.0)
MCH: 31.4 pg (ref 26.0–34.0)
RBC: 3.98 MIL/uL — ABNORMAL LOW (ref 4.22–5.81)

## 2012-08-18 LAB — PHOSPHORUS: Phosphorus: 4.6 mg/dL (ref 2.3–4.6)

## 2012-08-18 LAB — HEPATITIS B SURFACE ANTIGEN: Hepatitis B Surface Ag: NEGATIVE

## 2012-08-18 MED ORDER — DEXTROSE 50 % IV SOLN
INTRAVENOUS | Status: AC
  Filled 2012-08-18: qty 50

## 2012-08-18 MED ORDER — DEXTROSE 50 % IV SOLN
25.0000 mL | Freq: Once | INTRAVENOUS | Status: AC | PRN
  Administered 2012-08-18: 25 mL via INTRAVENOUS

## 2012-08-18 MED ORDER — BOOST / RESOURCE BREEZE PO LIQD
1.0000 | Freq: Three times a day (TID) | ORAL | Status: DC
  Administered 2012-08-18 – 2012-08-20 (×2): 1 via ORAL

## 2012-08-18 NOTE — Plan of Care (Signed)
Problem: Phase I Progression Outcomes Goal: Other Phase I Outcomes/Goals Outcome: Progressing Pt evaluated by ACT team

## 2012-08-18 NOTE — Treatment Plan (Signed)
According to pt's lab work in epic and recent VS he still not medically stable for consideration at [facility].

## 2012-08-18 NOTE — Progress Notes (Signed)
UR chart review completed.  

## 2012-08-18 NOTE — Progress Notes (Signed)
INITIAL NUTRITION ASSESSMENT  DOCUMENTATION CODES Per approved criteria  -Severe malnutrition in the context of chronic illness   INTERVENTION:  While pt is on clear liquids provide: Resource Breeze po BID, each supplement provides 250 kcal and 9 grams of protein.  When diet is advanced to full liquids start Nepro BID  NUTRITION DIAGNOSIS: Inadequate oral intake related to altered GI function as evidenced by nausea, vomiting poor po intake.   Goal: Pt to meet >/= 90% of their estimated nutrition needs  Monitor:  Po intake, labs and wt trends  Reason for Assessment: Malnutrition Screen Score = 3  52 y.o. male  Admitting Dx: ESRD needing dialysis  ASSESSMENT: Pt c/o abdominal pain and  nausea. He has experienced severe wt loss over past year of 53#,22% which is significant. Nursing says pt po very poor (5%). Currently on clear liquids. Home diet is Low Sodium. He likes Nepro.  He has ESRD with HD since last August. DM with CBG's 140-150 ml/dl per pt he regularly monitors glucose.   Pt meets criteria for severe malnutrition the context of chronic illness given his unplanned wt loss of 53#, severe muscle loss and body fat depletion.  Height: Ht Readings from Last 1 Encounters:  08/17/12 6' (1.829 m)    Weight: Wt Readings from Last 1 Encounters:  08/18/12 121 lb 14.6 oz (55.3 kg)    Ideal Body Weight: 178# (80.9 kg)  % Ideal Body Weight: 69%  Wt Readings from Last 10 Encounters:  08/18/12 121 lb 14.6 oz (55.3 kg)  08/28/11 175 lb (79.379 kg)  05/26/11 167 lb (75.751 kg)  05/26/11 167 lb (75.751 kg)  05/26/11 167 lb (75.751 kg)    Usual Body Weight: 165-175#  % Usual Body Weight: 74%  BMI:  Body mass index is 16.53 kg/(m^2). underweight  Estimated Nutritional Needs: Kcal: 1610-9604 Protein: 80-90 gr Fluid: 1000 ml plus output  Skin: No issues noted  Diet Order: Clear Liquid   EDUCATION NEEDS: -Education needs addressed   Intake/Output Summary  (Last 24 hours) at 08/18/12 1142 Last data filed at 08/18/12 0957  Gross per 24 hour  Intake    200 ml  Output   1412 ml  Net  -1212 ml    Last BM: 08/17/12  Labs:   Recent Labs Lab 08/16/12 1725 08/17/12 0746 08/18/12 0540  NA 135 133* 137  K 5.8* 6.7* 4.6  CL 95* 94* 96  CO2 26 29 32  BUN 42* 50* 21  CREATININE 10.71* 11.90* 7.02*  CALCIUM 9.3 9.1 9.1  PHOS  --   --  4.6  GLUCOSE 250* 137* 68*    CBG (last 3)   Recent Labs  08/18/12 0711 08/18/12 0812 08/18/12 1107  GLUCAP 61* 63* 61*    Scheduled Meds: . amLODipine  10 mg Oral Daily  . diazepam  10 mg Oral BID  . heparin  5,000 Units Subcutaneous Q8H  . HYDROmorphone  4 mg Oral BID  . insulin aspart  0-15 Units Subcutaneous TID WC  . multivitamin  1 tablet Oral Daily  . nicotine  21 mg Transdermal Daily  . pantoprazole  40 mg Oral Daily  . pneumococcal 23 valent vaccine  0.5 mL Intramuscular Tomorrow-1000  . sodium chloride  3 mL Intravenous Q12H  . sodium chloride  3 mL Intravenous Q12H  . sucralfate  1 g Oral TID WC & HS    Continuous Infusions:   Past Medical History  Diagnosis Date  . Diabetes mellitus   .  Hypertension   . GERD (gastroesophageal reflux disease)   . Retinopathy   . Peripheral neuropathy   . Chronic kidney disease   . Chest pain     a. 05/2011 Cath: nonobs dzs.  . Myocardial infarction 2000  . Depression   . Liver disease   . Cancer     Colon    Past Surgical History  Procedure Laterality Date  . Colostomy  Ileostomy    colon cancer  . Amputation      left pinky toe  . Esophagogastroduodenoscopy  05/23/2011    Procedure: ESOPHAGOGASTRODUODENOSCOPY (EGD);  Surgeon: Theda Belfast, MD;  Location: [facility] ENDOSCOPY;  Service: Endoscopy;  Laterality: N/A;  . Colon surgery  2009  . Cardiac catheterization  2012  . Eye surgery      Royann Shivers MS,RD,LDN,CSG Office: #[phone] Pager: [phone]

## 2012-08-18 NOTE — Progress Notes (Signed)
Mr Knoch BS 16 this am pt taking liquids with sugar Pt is asymptomatic

## 2012-08-18 NOTE — Care Management Note (Addendum)
    Page 1 of 2   08/31/2012     3:34:51 PM   CARE MANAGEMENT NOTE 08/31/2012  Patient:  Joe Vincent, Joe Vincent   Account Number:  [id number]  Date Initiated:  08/18/2012  Documentation initiated by:  Sharrie Rothman  Subjective/Objective Assessment:   Pt admitted from home with renal failure, and suicidal ideations. Pt is dailysis pt T-TH-SAT. Pt is homeless and living in his car currently.     Action/Plan:   ACT has evaluated pt and will attempt to place at [facility]. Will continue to follow for CM needs.   Anticipated DC Date:  08/19/2012   Anticipated [facility] Plan[facility]      DC Planning Services  CM consult      Choice offered to / List presented to:             Status of service:  Completed, signed off Medicare Important Message given?   (If response is "NO", the following Medicare IM given date fields will be blank) Date Medicare IM given:   Date Additional Medicare IM given:    Discharge Disposition:    Per UR Regulation:    If discussed at Long Length of Stay Meetings, dates discussed:   08/24/2012  08/26/2012  08/31/2012    Comments:  08/31/12 1530 Arlyss Queen, RN BSN CM Pt transfering to [facility] today. Receiving NCM will follow for any discharge planning needs.  08/27/12 1430 Arlyss Queen, RN BSN CM Pt on waiting list at [facility] [employer] bed. Will continue to follow for discharge planning needs.  08/24/12 1550 Byan Poplaski,RN BSN CM Pt still waiting for bed at [facility]. ACT team is looking for inpatient psych beds.  08/19/12 1300 Anibal Henderson RN/CM  [facility] says pt is too medically complex, per ACT team consult, and ACT team is working on other placement. Pt may have to go to [facility], but he does not want to go there, and wants to go home. Dr Kerry Hough has ordered another telepsych eval to determine if pt is safe to go home. 08/18/12 1515 Arlyss Queen, RN BSN CM

## 2012-08-18 NOTE — Progress Notes (Signed)
I spoke with Dr Karilyn Cota about pt low BS No treatment at this time Po liquids encouraged

## 2012-08-18 NOTE — Progress Notes (Signed)
  Joe Vincent ZOX:[id number] DOB: [birth year] DOA: 08/16/2012 PCP: [name], MD   Subjective: This patient underwent hemodialysis yesterday emergently and feels better medically. He still is suicidal.           Physical Exam: Blood pressure 113/71, pulse 84, temperature 99.9 F (37.7 C), temperature source Oral, resp. rate 20, height 6' (1.829 m), weight 55.3 kg (121 lb 14.6 oz), SpO2 97.00%. He looks systemically well. Heart sounds are present and normal without murmurs. Lung fields are clear. He is not clinically in heart failure. He is alert and orientated.   Investigations:  No results found for this or any previous visit (from the past 240 hour(s)).   Basic Metabolic Panel:  Recent Labs  81/19/14 0746 08/18/12 0540  NA 133* 137  K 6.7* 4.6  CL 94* 96  CO2 29 32  GLUCOSE 137* 68*  BUN 50* 21  CREATININE 11.90* 7.02*  CALCIUM 9.1 9.1  PHOS  --  4.6   Liver Function Tests:  Recent Labs  08/18/12 0540  AST 41*  ALT 34  ALKPHOS 139*  BILITOT 0.4  PROT 8.1  ALBUMIN 3.7     CBC:  Recent Labs  08/16/12 1725 08/17/12 0746 08/18/12 0540  WBC 9.0 11.1* 9.4  NEUTROABS 7.0  --   --   HGB 13.0 12.6* 12.5*  HCT 39.4 39.1 38.6*  MCV 95.4 97.3 97.0  PLT 149* 167 163    Dg Chest 2 View  08/17/2012   *RADIOLOGY REPORT*  Clinical Data: Nausea and hypertension.  CHEST - 2 VIEW  Comparison: 08/11/2012  Findings: Lungs hyperinflated and there may be a component of COPD. No infiltrate, edema, nodule or pleural effusion is identified. The heart size is within normal limits.  Stable calcified granuloma of the left upper lobe and associated calcified left hilar lymph nodes consistent with prior granulomatous disease.  IMPRESSION: Possible underlying COPD.  Evidence of prior granulomatous disease. No acute findings.   Original Report Authenticated By: Irish Lack, M.D.      Medications: I have reviewed the patient's current medications.  Impression: 1.  End-stage renal disease on hemodialysis. 2. Hyperkalemia, resolved after dialysis yesterday. 3. Hyponatremia, resolved. 4. Hypertension, stable. 5. Diabetes mellitus. 6. Suicidal ideation.     Plan: 1. Patient is medically stable for discharge. 2. We'll involve the act team for further disposition and management of his suicidal ideation.  Consultants:  Nephrology, Dr Fausto Skillern.   Procedures:  Hemodialysis yesterday.   Antibiotics:  None.                   Code Status: Full code.  Family Communication: Discuss plan with patient at the bedside.   Disposition Plan: Probable inpatient psychiatric unit when bed is available.  Time spent: 20 minutes.   LOS: 2 days   Wilson Singer Pager [phone]  08/18/2012, 8:32 AM

## 2012-08-18 NOTE — Clinical Social Work Note (Signed)
CSW received consult for psych placement. Pt was assessed by ACT in ED and CM spoke with ACT this morning who plan to follow up as pt is medically stable for transfer. CSW will defer to ACT but stand by to assist if needed.  Derenda Fennel, [place] [phone]

## 2012-08-18 NOTE — Progress Notes (Signed)
Joe Vincent  MRN: [id number]  DOB/AGE: Feb 12, 1960 52 y.o.  Primary Care Physician:BURDINE,STEVEN E, MD  Admit date: 08/16/2012  Chief Complaint:  Chief Complaint  Patient presents with  . V70.1    S-Pt presented on  08/16/2012 with  Chief Complaint  Patient presents with  . V70.1  .    Pt offers no complaint of chest pain   NO c/o fever/cough chills..   Meds  . amLODipine  10 mg Oral Daily  . diazepam  10 mg Oral BID  . heparin  5,000 Units Subcutaneous Q8H  . HYDROmorphone  4 mg Oral BID  . insulin aspart  0-15 Units Subcutaneous TID WC  . insulin glargine  9 Units Subcutaneous QHS  . multivitamin  1 tablet Oral Daily  . nicotine  21 mg Transdermal Daily  . pantoprazole  40 mg Oral Daily  . pneumococcal 23 valent vaccine  0.5 mL Intramuscular Tomorrow-1000  . sodium chloride  3 mL Intravenous Q12H  . sodium chloride  3 mL Intravenous Q12H  . sucralfate  1 g Oral TID WC & HS      Physical Exam: Vital signs in last 24 hours: Temp:  [98 F (36.7 C)-99.9 F (37.7 C)] 99.9 F (37.7 C) (07/16 0423) Pulse Rate:  [74-102] 84 (07/16 0423) Resp:  [16-20] 20 (07/16 0423) BP: (84-132)/(45-80) 113/71 mmHg (07/16 0423) SpO2:  [96 %-100 %] 97 % (07/16 0423) Weight:  [121 lb 14.6 oz (55.3 kg)-124 lb (56.246 kg)] 121 lb 14.6 oz (55.3 kg) (07/16 0500) Weight change: -4 lb 3 oz (-1.899 kg) Last BM Date: 08/17/12  Intake/Output from previous day: 07/15 0701 - 07/16 0700 In: -  Out: 1412 [Stool:1]     Physical Exam: General- pt is awake,alert, oriented to time place and person Resp- No acute REsp distress, CTA B/L NO Rhonchi CVS- S1S2 regular in rate and rhythm GIT- BS+, soft, NT, ND EXT- NO LE Edema, Cyanosis Access- Avf  Lab Results: CBC  Recent Labs  08/17/12 0746 08/18/12 0540  WBC 11.1* 9.4  HGB 12.6* 12.5*  HCT 39.1 38.6*  PLT 167 163    BMET  Recent Labs  08/17/12 0746 08/18/12 0540  NA 133* 137  K 6.7* 4.6  CL 94* 96  CO2 29 32  GLUCOSE  137* 68*  BUN 50* 21  CREATININE 11.90* 7.02*  CALCIUM 9.1 9.1       Lab Results  Component Value Date   PTH 149.2* 05/24/2011   CALCIUM 9.1 08/18/2012   CAION 1.06* 05/22/2011   PHOS 4.6 08/18/2012       Impression: 1)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  2)HTN BP at goal   3)Anemia HGb at goal (9--11) NO need of Epo   4)CKD Mineral-Bone Disorder PTH acceptable. Secondary Hyperparathyroidism present. Phosphorus at goal.   5)DM- being managed by primary team   6)Electrlytes Normokalemic-was hyperkalemic sec to non compliance  NOrmonatremic   7)Acid base Co2 at goal   8) Psych- Suicidal ideation Pt has sitter in room,. Primary team following. ACT team consult awaited.   Plan:  NO need of HD today Will dialyze in am       Larence Thone S 08/18/2012, 8:19 AM

## 2012-08-18 NOTE — BH Assessment (Signed)
Assessment Note   Joe Vincent is an 52 y.o. male. The patient is now medically cleared and ready for transfer to a psychiatric unit. He remains suicidal as nothing has changed for him. He will still live in his car. His SSDI check will  still be going for child support which he states his wife does not deserve. He  An not contrac gt for safety. He is not homicidal. He is not psychotic.  He was referred to [facility] when he first came to the ED and that referral will be made again.  Axis I: Anxiety Disorder NOS and Major Depression, single episode Axis II: Deferred Axis III:  Past Medical History  Diagnosis Date  . Diabetes mellitus   . Hypertension   . GERD (gastroesophageal reflux disease)   . Retinopathy   . Peripheral neuropathy   . Chronic kidney disease   . Chest pain     a. 05/2011 Cath: nonobs dzs.  . Myocardial infarction 2000  . Depression   . Liver disease   . Cancer     Colon   Axis IV: economic problems, housing problems, other psychosocial or environmental problems, problems with access to health care services and problems with primary support group Axis V: 21-30 behavior considerably influenced by delusions or hallucinations OR serious impairment in judgment, communication OR inability to function in almost all areas  Past Medical History:  Past Medical History  Diagnosis Date  . Diabetes mellitus   . Hypertension   . GERD (gastroesophageal reflux disease)   . Retinopathy   . Peripheral neuropathy   . Chronic kidney disease   . Chest pain     a. 05/2011 Cath: nonobs dzs.  . Myocardial infarction 2000  . Depression   . Liver disease   . Cancer     Colon    Past Surgical History  Procedure Laterality Date  . Colostomy  Ileostomy    colon cancer  . Amputation      left pinky toe  . Esophagogastroduodenoscopy  05/23/2011    Procedure: ESOPHAGOGASTRODUODENOSCOPY (EGD);  Surgeon: Theda Belfast, MD;  Location: [facility] ENDOSCOPY;  Service: Endoscopy;  Laterality: N/A;  .  Colon surgery  2009  . Cardiac catheterization  2012  . Eye surgery      Family History:  Family History  Problem Relation Age of Onset  . Cancer Mother   . Heart disease Father   . Cancer Father     Social History:  reports that he has been smoking Cigarettes and Cigars.  He has a 15 pack-year smoking history. He has never used smokeless tobacco. He reports that he does not drink alcohol or use illicit drugs.  Additional Social History:  Alcohol / Drug Use Pain Medications: NA Prescriptions: NA Over the Counter: NA History of alcohol / drug use?: No history of alcohol / drug abuse  CIWA: CIWA-Ar BP: 105/71 mmHg Pulse Rate: 89 COWS:    Allergies:  Allergies  Allergen Reactions  . Codeine Swelling    Home Medications:  Medications Prior to Admission  Medication Sig Dispense Refill  . amLODipine (NORVASC) 10 MG tablet Take 10 mg by mouth daily.      . insulin aspart (NOVOLOG) 100 UNIT/ML injection Inject 4-12 Units into the skin 3 (three) times daily as needed for high blood sugar (Uses a sliding scale at home).      . insulin glargine (LANTUS) 100 UNIT/ML injection Inject 15 Units into the skin at bedtime.       [name]  multivitamin (RENA-VIT) TABS tablet Take 1 tablet by mouth daily.        OB/GYN Status:  No LMP for male patient.  General Assessment Data Location of Assessment: [facility] (2 A Nursing Station; room 226) ACT Assessment: Yes Living Arrangements: Alone Can pt return to current living arrangement?: Yes Admission Status: Involuntary Is patient capable of signing voluntary admission?: No Transfer from: Acute Hospital Referral Source: Medical Floor Inpatient  Education Status Is patient currently in school?: No  Risk to self Suicidal Ideation: Yes-Currently Present Suicidal Intent: Yes-Currently Present Is patient at risk for suicide?: Yes Suicidal Plan?: Yes-Currently Present Specify Current Suicidal Plan: run car into tree Access to Means: Yes Specify  Access to Suicidal Means: has car What has been your use of drugs/alcohol within the last 12 months?: denies Previous Attempts/Gestures: Yes How many times?: 1 Other Self Harm Risks: na Triggers for Past Attempts: Other personal contacts Intentional Self Injurious Behavior: None Family Suicide History: No Recent stressful life event(s): Recent negative physical changes;Other (Comment) (HOMELESS) Persecutory voices/beliefs?: No Depression: Yes Depression Symptoms: Feeling worthless/self pity;Loss of interest in usual pleasures;Fatigue;Isolating;Despondent;Insomnia Substance abuse history and/or treatment for substance abuse?: No Suicide prevention information given to non-admitted patients: Not applicable  Risk to Others Homicidal Ideation: No Thoughts of Harm to Others: No Current Homicidal Intent: No Current Homicidal Plan: No Access to Homicidal Means: No Identified Victim: NA History of harm to others?: No Assessment of Violence: None Noted Violent Behavior Description: NA Does patient have access to weapons?: No Criminal Charges Pending?: No Does patient have a court date: No  Psychosis Hallucinations: None noted Delusions: None noted  Mental Status Report Appear/Hygiene: Poor hygiene;Disheveled Eye Contact: Fair Motor Activity: Agitation;Freedom of movement Speech: Logical/coherent Level of Consciousness: Alert Mood: Depressed;Irritable Affect: Appropriate to circumstance;Depressed;Sad Anxiety Level: Minimal Thought Processes: Coherent;Relevant Judgement: Impaired Orientation: Person;Place;Time;Situation Obsessive Compulsive Thoughts/Behaviors: Minimal  Cognitive Functioning Concentration: Decreased Memory: Recent Intact;Remote Intact IQ: Average Insight: Poor Impulse Control: Poor Appetite: Fair Weight Loss: 0 Weight Gain: 0 Sleep: Decreased Total Hours of Sleep: 3 Vegetative Symptoms: Decreased grooming  ADLScreening [facility] Assessment  Services) Patient's cognitive ability adequate to safely complete daily activities?: Yes Patient able to express need for assistance with ADLs?: Yes Independently performs ADLs?: Yes (appropriate for developmental age)  Abuse/Neglect [facility]) Physical Abuse: Denies Verbal Abuse: Denies Sexual Abuse: Denies  Prior Inpatient Therapy Prior Inpatient Therapy: No Prior Therapy Dates: NA Prior Therapy Facilty/Provider(s): NA Reason for Treatment: NA  Prior Outpatient Therapy Prior Outpatient Therapy: Yes Prior Therapy Dates: FIRST VISIT TO [facility] TODAY Prior Therapy Facilty/Provider(s): NA Reason for Treatment: NA  ADL Screening (condition at time of admission) Patient's cognitive ability adequate to safely complete daily activities?: Yes Is the patient deaf or have difficulty hearing?: No Does the patient have difficulty seeing, even when wearing glasses/contacts?: No Does the patient have difficulty concentrating, remembering, or making decisions?: No Patient able to express need for assistance with ADLs?: Yes Does the patient have difficulty dressing or bathing?: No Independently performs ADLs?: Yes (appropriate for developmental age) Does the patient have difficulty walking or climbing stairs?: No Weakness of Legs: None Weakness of Arms/Hands: None  Home Assistive Devices/Equipment Home Assistive Devices/Equipment: None  Therapy Consults (therapy consults require a physician order) PT Evaluation Needed: No OT Evalulation Needed: No SLP Evaluation Needed: No Abuse/Neglect Assessment (Assessment to be complete while patient is alone) Physical Abuse: Denies Verbal Abuse: Denies Sexual Abuse: Denies Exploitation of patient/patient's resources: Denies Self-Neglect: Denies Possible abuse reported to:: [facility] Social Work Values / Beliefs Cultural Requests During Hospitalization: None Spiritual Requests During Hospitalization: None Consults Spiritual Care Consult Needed:  No Social Work Consult Needed: Yes (Comment) Advance Directives (For Healthcare) Advance Directive: Patient does not have advance directive;Patient would not like information Pre-existing out of facility DNR order (yellow form or pink MOST form): No Nutrition Screen- [facility] Patient's home diet: Regular Have you recently lost weight without trying?: Yes If yes, how much weight have you lost?: Patient is unsure Have you been eating poorly because of a decreased appetite?: Yes Malnutrition Screening Tool Score: 3  Additional Information 1:1 In Past 12 Months?: No CIRT Risk: No Elopement Risk: No     Disposition: Referred to [facility]  Disposition Initial Assessment Completed for this Encounter: Yes Disposition of Patient: Inpatient treatment program Type of inpatient treatment program: Adult  On Site Evaluation by:   Reviewed with Physician:     Jearld Pies 08/18/2012 12:43 PM

## 2012-08-19 LAB — URINALYSIS, ROUTINE W REFLEX MICROSCOPIC
Glucose, UA: NEGATIVE mg/dL
Protein, ur: >300 mg/dL — AB

## 2012-08-19 LAB — GLUCOSE, CAPILLARY
Glucose-Capillary: 161 mg/dL — ABNORMAL HIGH (ref 70–99)
Glucose-Capillary: 136 mg/dL — ABNORMAL HIGH (ref 70–99)
Glucose-Capillary: 119 mg/dL — ABNORMAL HIGH (ref 70–99)

## 2012-08-19 MED ORDER — SUCRALFATE 1 G PO TABS: 1.0000 g | ORAL_TABLET | Freq: Three times a day (TID) | ORAL | Status: DC

## 2012-08-19 MED ORDER — INSULIN GLARGINE 100 UNIT/ML ~~LOC~~ SOLN
15.0000 [IU] | Freq: Every day | SUBCUTANEOUS | Status: DC
  Administered 2012-08-20 – 2012-09-06 (×18): 15 [IU] via SUBCUTANEOUS
  Filled 2012-08-19 (×20): qty 0.15

## 2012-08-19 MED ORDER — DIAZEPAM 10 MG PO TABS: 10.0000 mg | ORAL_TABLET | Freq: Two times a day (BID) | ORAL | Status: DC

## 2012-08-19 MED ORDER — PANTOPRAZOLE SODIUM 40 MG PO TBEC: 40.0000 mg | DELAYED_RELEASE_TABLET | Freq: Every day | ORAL | Status: DC

## 2012-08-19 MED ORDER — HYDROMORPHONE HCL 4 MG PO TABS: 4.0000 mg | ORAL_TABLET | Freq: Two times a day (BID) | ORAL | Status: DC

## 2012-08-19 MED ORDER — INSULIN GLARGINE 100 UNIT/ML ~~LOC~~ SOLN
SUBCUTANEOUS | Status: AC
  Filled 2012-08-19: qty 10

## 2012-08-19 NOTE — Progress Notes (Signed)
Subjective: Interval History: has complaints back and joint pain. He doesn't have any nausea or vomiting. His appetite is getting better. Patient denies any fever chills or sweating..  Objective: Vital signs in last 24 hours: Temp:  [97.6 F (36.4 C)-98.9 F (37.2 C)] 97.6 F (36.4 C) (07/17 0500) Pulse Rate:  [66-89] 66 (07/17 0500) Resp:  [18-20] 18 (07/17 0500) BP: (99-105)/(60-71) 104/61 mmHg (07/17 0500) SpO2:  [94 %-98 %] 98 % (07/17 0500) Weight:  [54.3 kg (119 lb 11.4 oz)] 54.3 kg (119 lb 11.4 oz) (07/17 0500) Weight change: -1.946 kg (-4 lb 4.6 oz)  Intake/Output from previous day: 07/16 0701 - 07/17 0700 In: 1180 [P.O.:1180] Out: -  Intake/Output this shift:    General appearance: alert, cooperative and no distress Resp: clear to auscultation bilaterally Cardio: regular rate and rhythm, S1, S2 normal, no murmur, click, rub or gallop GI: soft, non-tender; bowel sounds normal; no masses,  no organomegaly Extremities: extremities normal, atraumatic, no cyanosis or edema  Lab Results:  Recent Labs  08/17/12 0746 08/18/12 0540  WBC 11.1* 9.4  HGB 12.6* 12.5*  HCT 39.1 38.6*  PLT 167 163   BMET:  Recent Labs  08/17/12 0746 08/18/12 0540  NA 133* 137  K 6.7* 4.6  CL 94* 96  CO2 29 32  GLUCOSE 137* 68*  BUN 50* 21  CREATININE 11.90* 7.02*  CALCIUM 9.1 9.1   No results found for this basename: PTH,  in the last 72 hours Iron Studies: No results found for this basename: IRON, TIBC, TRANSFERRIN, FERRITIN,  in the last 72 hours  Studies/Results: Dg Chest 2 View  08/17/2012   *RADIOLOGY REPORT*  Clinical Data: Nausea and hypertension.  CHEST - 2 VIEW  Comparison: 08/11/2012  Findings: Lungs hyperinflated and there may be a component of COPD. No infiltrate, edema, nodule or pleural effusion is identified. The heart size is within normal limits.  Stable calcified granuloma of the left upper lobe and associated calcified left hilar lymph nodes consistent with  prior granulomatous disease.  IMPRESSION: Possible underlying COPD.  Evidence of prior granulomatous disease. No acute findings.   Original Report Authenticated By: Irish Lack, M.D.    I have reviewed the patient's current medications.  Assessment/Plan: Problem #1 end-stage renal disease is status post hemodialysis on Tuesday BUN is 21 creatinine 7.02 presently seems to be feeling better. Presently patient doesn't have any nausea or vomiting. Problem #2 hyperkalemia last potassium was 4.6 which is corrected Problem #3 hypertension his blood pressure is reasonably controlled Problem #4 diabetes Problem #5 suicide ideation Problem #6 metabolic bone disease his calcium and phosphorus was in acceptable range. Problem #7 hyponatremia sodium has corrected. Plan: We'll make arrangements for patient to get dialysis today We'll use 2K/ 2.5 calcium bath We'll continue his other medications as before.  LOS: 3 days   Keirstyn Aydt S 08/19/2012,7:03 AM

## 2012-08-19 NOTE — Progress Notes (Signed)
Discussed case with ACT team. [facility] Tele psychiatry consult done today indicates that he needs continued inpatient psychiatric treatment. We'll continue current treatments and await placement. Patient is under involuntary commitment papers.

## 2012-08-19 NOTE — Discharge Summary (Signed)
Physician Discharge Summary  Joe Vincent EAV:[id number] DOB: [birth year] DOA: 08/16/2012  PCP: [name], MD  Admit date: 08/16/2012 Discharge date: 08/19/2012  Time spent: 35 minutes  Recommendations for Outpatient Follow-up:  1. Patient will need transfer to inpatient psychiatry 2. Continue dialysis as per regular schedule  Discharge Diagnoses:  Principal Problem:   ESRD needing dialysis Active Problems:   Hypertension   Gastritis   CAD (coronary artery disease)   CKD (chronic kidney disease) stage 3, GFR 30-59 ml/min   Nausea and vomiting   Hyperkalemia   Hyponatremia   DM (diabetes mellitus)   Tobacco abuse   Suicide ideation   Discharge Condition: improved  Diet recommendation: low salt, low carb  Filed Weights   08/17/12 1640 08/18/12 0500 08/19/12 0500  Weight: 56.246 kg (124 lb) 55.3 kg (121 lb 14.6 oz) 54.3 kg (119 lb 11.4 oz)    History of present illness:  Joe Vincent is a 52 y.o. male with past medical history that includes end-stage renal disease on dialysis Tuesday Thursday and Saturdays, CAD, diabetes, hypertension, gastritis, colon cancer, presents to the emergency room with a chief complaint of shortness of breath. Information is obtained from the patient. He reports that he was discharged from [facility] on yesterday after a five-day stay. He reports he was staying with friends but they were "doing things I don't do" so he left. He states that during his hospitalization at [facility] he was supposed to be transferred to behavioral health for an inpatient admission do to persistence suicide ideation however there was not a bed available so they discharged him. He reports that he was instructed to come to the emergency room as "there was a bed in [place]. Chart review does indicate several admissions to [facility] there is mention on his most recent hospitalization that he expressed intentions to hurt himself that he was discharged and had to go  back to living in his car with no air conditioning. He reports persistent suicide ideation do to the constant rejection he feels from family friends and community. He states prior to his presentation to the emergency room yesterday he was feeling some intermittent shortness of breath along with persistent nausea and vomiting. He states he's had 2-3 episodes of emesis. Denies coffee ground emesis. He denies chest pain palpitations headache visual disturbances. Lab work in the emergency room is significant for sodium of 133 potassium of 6.7 chloride 94 creatinine 11.9, proBNP 3324.0, CBG 250. EKG yields sinus rhythm with 1st degree A-V block Left axis deviation Pulmonary disease pattern  Septal infarct , age undetermined Abnormal ECG When compared with ECG of 02-Aug-2012 07:55,Septal infarct is now Present Inverted T waves have replaced nonspecific T wave abnormality in Lateral leads.chest x-ray yields possible underlying COPD. Evidence of prior granulomatous disease. No acute findings. While in the emergency room last evening patient was seen by Dr. [name] who requested to be notified if patient was in the ED still today needing dialysis. Of note patient was dialyzed last Friday and Saturday. Symptoms came on gradually have persisted characterized as moderate. [facility] are asked to admit   Hospital Course:  This patient was admitted to the hospital with shortness of breath and hyperkalemia. He has known end-stage renal disease on hemodialysis. Patient also admitted to having suicidal ideations while in the emergency room. He was seen by nephrology and underwent dialysis. This has resulted in resolution of his shortness of breath and any other complaints. He feels back to baseline at this  time. He did have some nausea, but this has since resolved. He's had regular output from his ostomy. From a psychiatric standpoint, he will need to be placed in an inpatient psychiatric facility. ACT team is  following the patient. Medically, he appears to be very stable and ready for discharge. He can be discharged to a psychiatric facility once a bed is arranged. He will need to continue his chronic dialysis as per his schedule.  Procedures:  Dialysis  Consultations:  Nephrology  ACT team  Discharge Exam: Filed Vitals:   08/18/12 0900 08/18/12 1440 08/18/12 2100 08/19/12 0500  BP: 105/71 99/65 99/60  104/61  Pulse: 89 83 76 66  Temp:  98.2 F (36.8 C) 98.9 F (37.2 C) 97.6 F (36.4 C)  TempSrc:  Oral Oral Oral  Resp:  20 20 18   Height:      Weight:    54.3 kg (119 lb 11.4 oz)  SpO2:  94% 97% 98%    General: NAD Cardiovascular: S1, S2 RRR Respiratory:  CTA B  Discharge Instructions     Medication List         amLODipine 10 MG tablet  Commonly known as:  NORVASC  Take 10 mg by mouth daily.     diazepam 10 MG tablet  Commonly known as:  VALIUM  Take 1 tablet (10 mg total) by mouth 2 (two) times daily.     HYDROmorphone 4 MG tablet  Commonly known as:  DILAUDID  Take 1 tablet (4 mg total) by mouth 2 (two) times daily.     insulin aspart 100 UNIT/ML injection  Commonly known as:  novoLOG  Inject 4-12 Units into the skin 3 (three) times daily as needed for high blood sugar (Uses a sliding scale at home).     insulin glargine 100 UNIT/ML injection  Commonly known as:  LANTUS  Inject 15 Units into the skin at bedtime.     multivitamin Tabs tablet  Take 1 tablet by mouth daily.     pantoprazole 40 MG tablet  Commonly known as:  PROTONIX  Take 1 tablet (40 mg total) by mouth daily.     sucralfate 1 G tablet  Commonly known as:  CARAFATE  Take 1 tablet (1 g total) by mouth 4 (four) times daily -  with meals and at bedtime.       Allergies  Allergen Reactions  . Codeine Swelling      The results of significant diagnostics from this hospitalization (including imaging, microbiology, ancillary and laboratory) are listed below for reference.    Significant  Diagnostic Studies: Dg Chest 2 View  08/17/2012   *RADIOLOGY REPORT*  Clinical Data: Nausea and hypertension.  CHEST - 2 VIEW  Comparison: 08/11/2012  Findings: Lungs hyperinflated and there may be a component of COPD. No infiltrate, edema, nodule or pleural effusion is identified. The heart size is within normal limits.  Stable calcified granuloma of the left upper lobe and associated calcified left hilar lymph nodes consistent with prior granulomatous disease.  IMPRESSION: Possible underlying COPD.  Evidence of prior granulomatous disease. No acute findings.   Original Report Authenticated By: Irish Lack, M.D.    Microbiology: No results found for this or any previous visit (from the past 240 hour(s)).   Labs: Basic Metabolic Panel:  Recent Labs Lab 08/16/12 1725 08/17/12 0746 08/18/12 0540  NA 135 133* 137  K 5.8* 6.7* 4.6  CL 95* 94* 96  CO2 26 29 32  GLUCOSE 250* 137* 68*  BUN 42* 50* 21  CREATININE 10.71* 11.90* 7.02*  CALCIUM 9.3 9.1 9.1  PHOS  --   --  4.6   Liver Function Tests:  Recent Labs Lab 08/18/12 0540  AST 41*  ALT 34  ALKPHOS 139*  BILITOT 0.4  PROT 8.1  ALBUMIN 3.7   No results found for this basename: LIPASE, AMYLASE,  in the last 168 hours No results found for this basename: AMMONIA,  in the last 168 hours CBC:  Recent Labs Lab 08/16/12 1725 08/17/12 0746 08/18/12 0540  WBC 9.0 11.1* 9.4  NEUTROABS 7.0  --   --   HGB 13.0 12.6* 12.5*  HCT 39.4 39.1 38.6*  MCV 95.4 97.3 97.0  PLT 149* 167 163   Cardiac Enzymes: No results found for this basename: CKTOTAL, CKMB, CKMBINDEX, TROPONINI,  in the last 168 hours BNP: BNP (last 3 results)  Recent Labs  08/17/12 0746  PROBNP 3324.0*   CBG:  Recent Labs Lab 08/18/12 1107 08/18/12 1301 08/18/12 1626 08/18/12 2057 08/19/12 0712  GLUCAP 61* 113* 109* 179* 161*       Signed:  Devario Bucklew  [facility] 08/19/2012, 10:34 AM

## 2012-08-19 NOTE — Procedures (Signed)
HEMODIALYSIS TREATMENT NOTE:  3.5 hour HD session ordered.  AVF was cannulated with 15g needles (antegrade) without difficulty and treatment was initiated.  Psych nurse met with pt during HD for discussion about discharge plans to state hospital in [place].  After psych nurse left, pt expressed concern about leaving his car unattended in [place]. "My whole life is in that car."  Became increasingly nervous and asked to speak with psych nurse again.  Requested to end HD early/AMA.  Attempts to discuss risks were met with resistance.  "Just take me off [of hemodialysis], please."  Psych nurse returned as patient was ending treatment and conveyed to patient that a telepsych consult would be ordered to further aid in discharge planning.  After nurse left pt said, "I know what not to say."  When asked to elaborate, pt stated, "I'm not saying anything else."  We sat in silence while I removed his fistula needles.  I then asked him, "What would you like to see happen?"  Pt replied, "I'd like to get some help but I can't lose my car."  Further stated he had a brother in law who might be able to keep car.  Pt was given a phone and was able to reach his brother who agreed to help.   He states he is now willing to seek help in [place].  Dr. [name] was notified of pt's ending treatment early/AMA Total HD time:  1.5 hours Total UF:  800cc.  Arman Filter, RN, CDN

## 2012-08-19 NOTE — Progress Notes (Addendum)
Pt continues to be suicidal with the plan to drive into a tree so that he does not hurt someone else. He does intend to kill himself because his circumstances are so grim and he feels hopeless. He is on hemodialysis with other multiple medical problems and living in his car. He has no family supports except a sister who lives in [place]: He talks to her on the telephone sometimes.   1:21 PM Pt has decided that he wants to go home because if he goes to [facility] he will likely loose his car which is in [place]. Dr. Kerry Hough is aware of his request and will order another telepsych to determine if pt is safe to leave. So far he has been turned down by [facility] by Maury Dus; [facility] by Josph Macho and [facility] by Faith Rogue.  Disposition decision pending telephsych.   2:30 PM Pt has called his brother-in-law to come and get his car if he is admitted. Pending telepsych. Encouraged him to tell the psychiatrist if he is suicidal.

## 2012-08-20 LAB — GLUCOSE, CAPILLARY: Glucose-Capillary: 259 mg/dL — ABNORMAL HIGH (ref 70–99)

## 2012-08-20 MED ORDER — GABAPENTIN 100 MG PO CAPS
200.0000 mg | ORAL_CAPSULE | Freq: Three times a day (TID) | ORAL | Status: DC
  Administered 2012-08-20: 200 mg via ORAL
  Filled 2012-08-20 (×2): qty 2

## 2012-08-20 MED ORDER — OXYCODONE HCL 5 MG PO TABS
5.0000 mg | ORAL_TABLET | ORAL | Status: DC | PRN
  Administered 2012-08-20: 5 mg via ORAL
  Filled 2012-08-20 (×3): qty 1

## 2012-08-20 NOTE — Progress Notes (Signed)
Subjective: Interval History: Denies any difficulty in breathing. He has still some nausea no vomiting. His main complaint seems to be his back. Presently patient refused to take his medication claiming that he did work for him. His appetite is okay and he doesn't have any abdominal pain.  Objective: Vital signs in last 24 hours: Temp:  [98 F (36.7 C)-99.2 F (37.3 C)] 98 F (36.7 C) (07/18 0543) Pulse Rate:  [72-97] 72 (07/18 0543) Resp:  [18-22] 18 (07/18 0543) BP: (89-117)/(50-69) 102/60 mmHg (07/18 0543) SpO2:  [97 %-100 %] 100 % (07/18 0543) Weight:  [56.246 kg (124 lb)] 56.246 kg (124 lb) (07/17 1040) Weight change: 1.946 kg (4 lb 4.6 oz)  Intake/Output from previous day: 07/17 0701 - 07/18 0700 In: 720 [P.O.:720] Out: 628  Intake/Output this shift:    General appearance: alert, cooperative and no distress Resp: clear to auscultation bilaterally Cardio: regular rate and rhythm, S1, S2 normal, no murmur, click, rub or gallop GI: soft, non-tender; bowel sounds normal; no masses,  no organomegaly Extremities: extremities normal, atraumatic, no cyanosis or edema  Lab Results:  Recent Labs  08/18/12 0540  WBC 9.4  HGB 12.5*  HCT 38.6*  PLT 163   BMET:   Recent Labs  08/18/12 0540  NA 137  K 4.6  CL 96  CO2 32  GLUCOSE 68*  BUN 21  CREATININE 7.02*  CALCIUM 9.1   No results found for this basename: PTH,  in the last 72 hours Iron Studies: No results found for this basename: IRON, TIBC, TRANSFERRIN, FERRITIN,  in the last 72 hours  Studies/Results: No results found.  I have reviewed the patient's current medications.  Assessment/Plan: Problem #1 end-stage renal disease is status post hemodialysis yesterday , his BUN is 21  andcreatinine 7.02 presently seems to be feeling better. Patient signed off after one hour and 45 minutes of dialysis. Patient has been doing that for some time. Problem #2 hyperkalemia last potassium was 4.6 which is  corrected Problem #3 hypertension his blood pressure is reasonably controlled Problem #4 diabetes Problem #5 suicide ideation Problem #6 metabolic bone disease his calcium and phosphorus is wiyh  in acceptable range. Problem #7 hyponatremia sodium has corrected. Plan: We'll make arrangements for patient to get dialysis tomorrow. We'll use 2K/ 2.5 calcium bath We'll continue his other medications as before. We'll check his basic metabolic panel and CBC in the morning.  LOS: 4 days   Custer Pimenta S 08/20/2012,7:57 AM

## 2012-08-20 NOTE — Progress Notes (Signed)
Pt assessed at this time and meds removed. When offering medications to pt, he states that the medicine does not work and he does not want it. Meds wasted.

## 2012-08-20 NOTE — Progress Notes (Signed)
[facility] PROGRESS NOTE  [employer] ZOX:[id number] DOB: 06/13/1960 DOA: 08/16/2012 PCP: [name], MD  Assessment/Plan: 1. Suicidal ideations. Psychiatry service recommends inpatient psychiatric transfer. Due to his multiple medical problems[facility]. 2. End-stage renal disease on hemodialysis, Tuesday Thursday Saturday, nephrology following. 3. Diabetic neuropathy. We'll start the patient on gabapentin 4. Hypertension. Stable 5. Diabetes. Continue to follow blood sugars.  Code Status: full code Family Communication: discussed with patient  Disposition Plan: await transfer to inpatient psych   Consultants:  Nephrology  Telepsychiatry  Procedures:  none  Antibiotics:  none  HPI/Subjective: Having pain in legs from neuropathy, no other complaints  Objective: Filed Vitals:   08/19/12 1449 08/19/12 1529 08/19/12 2040 08/20/12 0543  BP: 94/61 95/59 97/55  102/60  Pulse: 97  87 72  Temp: 98.8 F (37.1 C)  99.2 F (37.3 C) 98 F (36.7 C)  TempSrc: Oral  Oral Oral  Resp: 18  18 18   Height:      Weight:      SpO2: 100%  97% 100%    Intake/Output Summary (Last 24 hours) at 08/20/12 1144 Last data filed at 08/20/12 1134  Gross per 24 hour  Intake    960 ml  Output    928 ml  Net     32 ml   Filed Weights   08/18/12 0500 08/19/12 0500 08/19/12 1040  Weight: 55.3 kg (121 lb 14.6 oz) 54.3 kg (119 lb 11.4 oz) 56.246 kg (124 lb)    Exam:   General:  NAD  Cardiovascular: S1, S2 RRR  Respiratory: CTA B  Abdomen: soft, nt, nd, bs+  Musculoskeletal: no edema b/l   Data Reviewed: Basic Metabolic Panel:  Recent Labs Lab 08/16/12 1725 08/17/12 0746 08/18/12 0540  NA 135 133* 137  K 5.8* 6.7* 4.6  CL 95* 94* 96  CO2 26 29 32  GLUCOSE 250* 137* 68*  BUN 42* 50* 21  CREATININE 10.71* 11.90* 7.02*  CALCIUM 9.3 9.1 9.1  PHOS  --   --  4.6   Liver Function Tests:  Recent  Labs Lab 08/18/12 0540  AST 41*  ALT 34  ALKPHOS 139*  BILITOT 0.4  PROT 8.1  ALBUMIN 3.7   No results found for this basename: LIPASE, AMYLASE,  in the last 168 hours No results found for this basename: AMMONIA,  in the last 168 hours CBC:  Recent Labs Lab 08/16/12 1725 08/17/12 0746 08/18/12 0540  WBC 9.0 11.1* 9.4  NEUTROABS 7.0  --   --   HGB 13.0 12.6* 12.5*  HCT 39.4 39.1 38.6*  MCV 95.4 97.3 97.0  PLT 149* 167 163   Cardiac Enzymes: No results found for this basename: CKTOTAL, CKMB, CKMBINDEX, TROPONINI,  in the last 168 hours BNP (last 3 results)  Recent Labs  08/17/12 0746  PROBNP 3324.0*   CBG:  Recent Labs Lab 08/19/12 0712 08/19/12 1110 08/19/12 1644 08/19/12 2115 08/20/12 0728  GLUCAP 161* 136* 320* 119* 190*    No results found for this or any previous visit (from the past 240 hour(s)).   Studies: No results found.  Scheduled Meds: . diazepam  10 mg Oral BID  . feeding supplement  1 Container Oral TID BM  . heparin  5,000 Units Subcutaneous Q8H  . HYDROmorphone  4 mg Oral BID  . insulin aspart  0-15 Units Subcutaneous TID WC  . insulin glargine  15 Units Subcutaneous QHS  .  multivitamin  1 tablet Oral Daily  . nicotine  21 mg Transdermal Daily  . pantoprazole  40 mg Oral Daily  . sodium chloride  3 mL Intravenous Q12H  . sodium chloride  3 mL Intravenous Q12H  . sucralfate  1 g Oral TID WC & HS   Continuous Infusions:   Principal Problem:   ESRD needing dialysis Active Problems:   Hypertension   Gastritis   CAD (coronary artery disease)   CKD (chronic kidney disease) stage 3, GFR 30-59 ml/min   Nausea and vomiting   Hyperkalemia   Hyponatremia   DM (diabetes mellitus)   Tobacco abuse   Suicide ideation    Time spent:    [facility]  [facility] Pager [phone]. If 7PM-7AM, please contact night-coverage at [email or web address], password [facility] 08/20/2012, 11:44 AM  LOS: 4 days

## 2012-08-20 NOTE — Clinical Social Work Note (Signed)
Pt awaiting placement at [facility] as telepsych yesterday recommended inpatient treatment and pt has been denied at multiple facilities. ACT team following.   Derenda Fennel, [place] [phone]

## 2012-08-20 NOTE — Progress Notes (Signed)
Pt refuses to take his dilaudid, valuim, and neurotin.  He states he has been on Dilaidid and Valuim for a year and a half and it has not helped.  He also states that he has been on neurotin and has not had relief.  He still complains of lots of pain.

## 2012-08-21 DIAGNOSIS — R45851 Suicidal ideations: Secondary | ICD-10-CM

## 2012-08-21 DIAGNOSIS — G8929 Other chronic pain: Secondary | ICD-10-CM

## 2012-08-21 LAB — CBC
Hemoglobin: 11.3 g/dL — ABNORMAL LOW (ref 13.0–17.0)
MCH: 31.3 pg (ref 26.0–34.0)
MCV: 94.2 fL (ref 78.0–100.0)
Platelets: 140 10*3/uL — ABNORMAL LOW (ref 150–400)
RBC: 3.61 MIL/uL — ABNORMAL LOW (ref 4.22–5.81)
WBC: 6 10*3/uL (ref 4.0–10.5)

## 2012-08-21 LAB — GLUCOSE, CAPILLARY
Glucose-Capillary: 187 mg/dL — ABNORMAL HIGH (ref 70–99)
Glucose-Capillary: 143 mg/dL — ABNORMAL HIGH (ref 70–99)

## 2012-08-21 LAB — BASIC METABOLIC PANEL
CO2: 24 mEq/L (ref 19–32)
Calcium: 8.5 mg/dL (ref 8.4–10.5)
Chloride: 93 mEq/L — ABNORMAL LOW (ref 96–112)
Glucose, Bld: 233 mg/dL — ABNORMAL HIGH (ref 70–99)
Potassium: 5 mEq/L (ref 3.5–5.1)
Sodium: 130 mEq/L — ABNORMAL LOW (ref 135–145)

## 2012-08-21 LAB — URINE CULTURE: Colony Count: 15000

## 2012-08-21 MED ORDER — MORPHINE SULFATE 2 MG/ML IJ SOLN
2.0000 mg | INTRAMUSCULAR | Status: DC | PRN
  Administered 2012-08-21 – 2012-08-23 (×7): 2 mg via INTRAVENOUS
  Filled 2012-08-21 (×7): qty 1

## 2012-08-21 MED ORDER — ALTEPLASE 2 MG IJ SOLR: 2.0000 mg | Freq: Once | INTRAMUSCULAR | Status: AC | PRN

## 2012-08-21 MED ORDER — SODIUM CHLORIDE 0.9 % IV SOLN: 100.0000 mL | INTRAVENOUS | Status: DC | PRN

## 2012-08-21 NOTE — Progress Notes (Signed)
Subjective: Interval History: Denies any difficulty in breathing. His main complaint seems to be his back. Presently on dialysis. No nausea or vomiting.  Objective: Vital signs in last 24 hours: Temp:  [97.5 F (36.4 C)-98.9 F (37.2 C)] 98 F (36.7 C) (07/19 0900) Pulse Rate:  [65-84] 73 (07/19 1000) Resp:  [18-20] 20 (07/19 0900) BP: (96-123)/(60-81) 117/64 mmHg (07/19 1000) SpO2:  [98 %-100 %] 100 % (07/19 0900) Weight:  [60.963 kg (134 lb 6.4 oz)] 60.963 kg (134 lb 6.4 oz) (07/19 0900) Weight change:   Intake/Output from previous day: 07/18 0701 - 07/19 0700 In: 1520 [P.O.:1520] Out: 300 [Stool:300] Intake/Output this shift:    General appearance: alert, cooperative and no distress Resp: clear to auscultation bilaterally Cardio: regular rate and rhythm, S1, S2 normal, no murmur, click, rub or gallop GI: soft, non-tender; bowel sounds normal; no masses,  no organomegaly Extremities: extremities normal, atraumatic, no cyanosis or edema  Lab Results:  Recent Labs  08/21/12 0519  WBC 6.0  HGB 11.3*  HCT 34.0*  PLT 140*   BMET:   Recent Labs  08/21/12 0519  NA 130*  K 5.0  CL 93*  CO2 24  GLUCOSE 233*  BUN 54*  CREATININE 9.80*  CALCIUM 8.5   No results found for this basename: PTH,  in the last 72 hours Iron Studies: No results found for this basename: IRON, TIBC, TRANSFERRIN, FERRITIN,  in the last 72 hours  Studies/Results: No results found.  I have reviewed the patient's current medications.  Assessment/Plan: Problem #1 end-stage renal disease is status post hemodialysis yesterday , his BUN is 54  andcreatinine 9.8  Problem #2 hyperkalemia last potassium was 5.0  corrected Problem #3 hypertension his blood pressure is reasonably controlled Problem #4 diabetes Problem #5 suicide ideation Problem #6 metabolic bone disease his calcium and phosphorus is with  in acceptable range. Problem #7 hyponatremia sodium sodium low  Plan: We'll will continue  with present treatment.  LOS: 5 days   Labrandon Knoch S 08/21/2012,10:53 AM

## 2012-08-21 NOTE — Progress Notes (Signed)
[facility] PROGRESS NOTE  Joe Vincent [id number] DOB: [birth year] DOA: 08/16/2012 PCP: [name], MD  Assessment/Plan: 1. Suicidal ideations. Psychiatry service recommends inpatient psychiatric transfer. Due to his multiple medical problems, [facility]ing [facility]. 2. End-stage renal disease on hemodialysis, Tuesday Thursday Saturday, nephrology following. 3. Diabetic neuropathy. On gabapentin.  Patient reports he was previously on dialudid po and was being followed at pain clinic.  Will use morphine for now. 4. Hypertension. Stable 5. Diabetes. Continue to follow blood sugars.    Code Status: full code Family Communication: discussed with patient  Disposition Plan: await transfer to inpatient psych   Consultants:  Nephrology  Telepsychiatry  Procedures:  none  Antibiotics:  none  HPI/Subjective: Having pain in arms/legs from neuropathy, no other complaints  Objective: Filed Vitals:   08/21/12 1030 08/21/12 1100 08/21/12 1130 08/21/12 1200  BP: 90/52 115/70 112/65 93/61  Pulse: 81 87 78 79  Temp:      TempSrc:      Resp:      Height:      Weight:      SpO2:        Intake/Output Summary (Last 24 hours) at 08/21/12 1216 Last data filed at 08/21/12 0500  Gross per 24 hour  Intake    920 ml  Output      0 ml  Net    920 ml   Filed Weights   08/19/12 0500 08/19/12 1040 08/21/12 0900  Weight: 54.3 kg (119 lb 11.4 oz) 56.246 kg (124 lb) 60.963 kg (134 lb 6.4 oz)    Exam:   General:  NAD  Cardiovascular: S1, S2 RRR  Respiratory: CTA B  Abdomen: soft, nt, nd, bs+, ostomy bag in place  Musculoskeletal: no edema b/l   Data Reviewed: Basic Metabolic Panel:  Recent Labs Lab 08/16/12 1725 08/17/12 0746 08/18/12 0540 08/21/12 0519  NA 135 133* 137 130*  K 5.8* 6.7* 4.6 5.0  CL 95* 94* 96 93*  CO2 26 29 32 24  GLUCOSE 250* 137* 68* 233*  BUN 42* 50* 21 54*  CREATININE 10.71*  11.90* 7.02* 9.80*  CALCIUM 9.3 9.1 9.1 8.5  PHOS  --   --  4.6 2.7   Liver Function Tests:  Recent Labs Lab 08/18/12 0540  AST 41*  ALT 34  ALKPHOS 139*  BILITOT 0.4  PROT 8.1  ALBUMIN 3.7   No results found for this basename: LIPASE, AMYLASE,  in the last 168 hours No results found for this basename: AMMONIA,  in the last 168 hours CBC:  Recent Labs Lab 08/16/12 1725 08/17/12 0746 08/18/12 0540 08/21/12 0519  WBC 9.0 11.1* 9.4 6.0  NEUTROABS 7.0  --   --   --   HGB 13.0 12.6* 12.5* 11.3*  HCT 39.4 39.1 38.6* 34.0*  MCV 95.4 97.3 97.0 94.2  PLT 149* 167 163 140*   Cardiac Enzymes: No results found for this basename: CKTOTAL, CKMB, CKMBINDEX, TROPONINI,  in the last 168 hours BNP (last 3 results)  Recent Labs  08/17/12 0746  PROBNP 3324.0*   CBG:  Recent Labs Lab 08/20/12 1203 08/20/12 1701 08/20/12 2102 08/21/12 0807 08/21/12 1142  GLUCAP 183* 233* 259* 187* 174*    No results found for this or any previous visit (from the past 240 hour(s)).   Studies: No results found.  Scheduled Meds: . diazepam  10 mg Oral BID  . feeding supplement  1 Container Oral TID  BM  . gabapentin  200 mg Oral TID  . heparin  5,000 Units Subcutaneous Q8H  . insulin aspart  0-15 Units Subcutaneous TID WC  . insulin glargine  15 Units Subcutaneous QHS  . multivitamin  1 tablet Oral Daily  . nicotine  21 mg Transdermal Daily  . pantoprazole  40 mg Oral Daily  . sodium chloride  3 mL Intravenous Q12H  . sodium chloride  3 mL Intravenous Q12H  . sucralfate  1 g Oral TID WC & HS   Continuous Infusions:   Principal Problem:   ESRD needing dialysis Active Problems:   Hypertension   Gastritis   CAD (coronary artery disease)   CKD (chronic kidney disease) stage 3, GFR 30-59 ml/min   Nausea and vomiting   Hyperkalemia   Hyponatremia   DM (diabetes mellitus)   Tobacco abuse   Suicide ideation    Time spent:    [facility]  [facility] Pager [phone]. If 7PM-7AM, please contact night-coverage at [email or web address], password [facility] 08/21/2012, 12:16 PM  LOS: 5 days

## 2012-08-21 NOTE — Procedures (Signed)
   HEMODIALYSIS TREATMENT NOTE:  3.5 hour HD session completed through right upper arm AVF (15g/antegrade). Goal met:  Tolerated removal of 2 liters with no interruption in ultrafiltration. All blood was reinfused. Hemostasis achieved within 20 minutes.  Report given to Zannie Kehr, RN

## 2012-08-22 LAB — GLUCOSE, CAPILLARY
Glucose-Capillary: 152 mg/dL — ABNORMAL HIGH (ref 70–99)
Glucose-Capillary: 201 mg/dL — ABNORMAL HIGH (ref 70–99)
Glucose-Capillary: 136 mg/dL — ABNORMAL HIGH (ref 70–99)

## 2012-08-22 MED ORDER — GABAPENTIN 100 MG PO CAPS: 200.0000 mg | ORAL_CAPSULE | Freq: Every day | ORAL | Status: DC

## 2012-08-22 NOTE — Progress Notes (Signed)
[facility] PROGRESS NOTE  Joe Vincent [id number]:[id number] DOB: April 14, 1960 DOA: 08/16/2012 PCP: [name], MD  Assessment/Plan: 1. Suicidal ideations. Psychiatry service recommends inpatient psychiatric transfer. Due to his multiple medical problems, [facility] Awaiting a [facility]. ACT team arranging this. 2. End-stage renal disease on hemodialysis, Tuesday Thursday Saturday, nephrology following. 3. Diabetic neuropathy. On gabapentin.  Patient reports he was previously on dialudid po and was being followed at pain clinic.  Will use morphine for now. 4. Hypertension. Stable 5. Diabetes. Continue to follow blood sugars.    Code Status: full code Family Communication: discussed with patient  Disposition Plan: await transfer to inpatient psych, ACT team following   Consultants:  Nephrology  Telepsychiatry  Procedures:  none  Antibiotics:  none  HPI/Subjective: Reports pain is mildly improving.  Objective: Filed Vitals:   08/21/12 1250 08/21/12 1500 08/21/12 2000 08/22/12 0604  BP: 99/65 90/56 91/60  106/61  Pulse: 79 86 82 67  Temp:  99.1 F (37.3 C) 98.5 F (36.9 C) 98.4 F (36.9 C)  TempSrc:    Oral  Resp:  18 18 18   Height:      Weight:    60.374 kg (133 lb 1.6 oz)  SpO2:  99% 99% 99%    Intake/Output Summary (Last 24 hours) at 08/22/12 1155 Last data filed at 08/22/12 0800  Gross per 24 hour  Intake   1800 ml  Output   2200 ml  Net   -400 ml   Filed Weights   08/19/12 1040 08/21/12 0900 08/22/12 0604  Weight: 56.246 kg (124 lb) 60.963 kg (134 lb 6.4 oz) 60.374 kg (133 lb 1.6 oz)    Exam:   General:  NAD  Cardiovascular: S1, S2 RRR  Respiratory: CTA B  Abdomen: soft, nt, nd, bs+, ostomy bag in place  Musculoskeletal: no edema b/l   Data Reviewed: Basic Metabolic Panel:  Recent Labs Lab 08/16/12 1725 08/17/12 0746 08/18/12 0540 08/21/12 0519  NA 135 133* 137 130*  K 5.8* 6.7* 4.6 5.0   CL 95* 94* 96 93*  CO2 26 29 32 24  GLUCOSE 250* 137* 68* 233*  BUN 42* 50* 21 54*  CREATININE 10.71* 11.90* 7.02* 9.80*  CALCIUM 9.3 9.1 9.1 8.5  PHOS  --   --  4.6 2.7   Liver Function Tests:  Recent Labs Lab 08/18/12 0540  AST 41*  ALT 34  ALKPHOS 139*  BILITOT 0.4  PROT 8.1  ALBUMIN 3.7   No results found for this basename: LIPASE, AMYLASE,  in the last 168 hours No results found for this basename: AMMONIA,  in the last 168 hours CBC:  Recent Labs Lab 08/16/12 1725 08/17/12 0746 08/18/12 0540 08/21/12 0519  WBC 9.0 11.1* 9.4 6.0  NEUTROABS 7.0  --   --   --   HGB 13.0 12.6* 12.5* 11.3*  HCT 39.4 39.1 38.6* 34.0*  MCV 95.4 97.3 97.0 94.2  PLT 149* 167 163 140*   Cardiac Enzymes: No results found for this basename: CKTOTAL, CKMB, CKMBINDEX, TROPONINI,  in the last 168 hours BNP (last 3 results)  Recent Labs  08/17/12 0746  PROBNP 3324.0*   CBG:  Recent Labs Lab 08/21/12 0807 08/21/12 1142 08/21/12 1645 08/21/12 2035 08/22/12 0728  GLUCAP 187* 174* 245* 143* 133*    Recent Results (from the past 240 hour(s))  URINE CULTURE     Status: None   Collection Time    08/19/12  7:57 PM  Result Value Range Status   Specimen Description URINE, CLEAN CATCH   Final   Special Requests NONE   Final   Culture  Setup Time 08/19/2012 21:00   Final   Colony Count 15,000 COLONIES/ML   Final   Culture     Final   Value: Multiple bacterial morphotypes present, none predominant. Suggest appropriate recollection if clinically indicated.   Report Status 08/21/2012 FINAL   Final     Studies: No results found.  Scheduled Meds: . diazepam  10 mg Oral BID  . feeding supplement  1 Container Oral TID BM  . gabapentin  200 mg Oral TID  . heparin  5,000 Units Subcutaneous Q8H  . insulin aspart  0-15 Units Subcutaneous TID WC  . insulin glargine  15 Units Subcutaneous QHS  . multivitamin  1 tablet Oral Daily  . nicotine  21 mg Transdermal Daily  .  pantoprazole  40 mg Oral Daily  . sodium chloride  3 mL Intravenous Q12H  . sodium chloride  3 mL Intravenous Q12H  . sucralfate  1 g Oral TID WC & HS   Continuous Infusions:   Principal Problem:   ESRD needing dialysis Active Problems:   Hypertension   Gastritis   CAD (coronary artery disease)   CKD (chronic kidney disease) stage 3, GFR 30-59 ml/min   Nausea and vomiting   Hyperkalemia   Hyponatremia   DM (diabetes mellitus)   Tobacco abuse   Suicide ideation    Time spent:    [facility]  [facility] Pager [phone]. If 7PM-7AM, please contact night-coverage at [email or web address], password [facility] 08/22/2012, 11:55 AM  LOS: 6 days

## 2012-08-22 NOTE — Progress Notes (Signed)
Notified Dr. Earlyne Iba that the patient has been refusing the neurotin after one time taking the medication.  He has since been refusing the medication.  The patient states that the medication has not been working for him.  New orders provided.

## 2012-08-22 NOTE — Progress Notes (Signed)
Subjective: Interval History: Denies any difficulty in breathing. Presently on dialysis. No nausea or vomiting.  Objective: Vital signs in last 24 hours: Temp:  [98 F (36.7 C)-99.1 F (37.3 C)] 98.4 F (36.9 C) (07/20 0604) Pulse Rate:  [65-87] 67 (07/20 0604) Resp:  [18-20] 18 (07/20 0604) BP: (88-123)/(52-70) 106/61 mmHg (07/20 0604) SpO2:  [99 %-100 %] 99 % (07/20 0604) Weight:  [60.374 kg (133 lb 1.6 oz)-60.963 kg (134 lb 6.4 oz)] 60.374 kg (133 lb 1.6 oz) (07/20 0604) Weight change:   Intake/Output from previous day: 07/19 0701 - 07/20 0700 In: 1920 [P.O.:1920] Out: 2200  Intake/Output this shift:    General appearance: alert, cooperative and no distress Resp: clear to auscultation bilaterally Cardio: regular rate and rhythm, S1, S2 normal, no murmur, click, rub or gallop GI: soft, non-tender; bowel sounds normal; no masses,  no organomegaly Extremities: extremities normal, atraumatic, no cyanosis or edema  Lab Results:  Recent Labs  08/21/12 0519  WBC 6.0  HGB 11.3*  HCT 34.0*  PLT 140*   BMET:   Recent Labs  08/21/12 0519  NA 130*  K 5.0  CL 93*  CO2 24  GLUCOSE 233*  BUN 54*  CREATININE 9.80*  CALCIUM 8.5   No results found for this basename: PTH,  in the last 72 hours Iron Studies: No results found for this basename: IRON, TIBC, TRANSFERRIN, FERRITIN,  in the last 72 hours  Studies/Results: No results found.  I have reviewed the patient's current medications.  Assessment/Plan: Problem #1 end-stage renal disease is status post hemodialysis yesterday , his BUN is 54  andcreatinine 9.8  Problem #2 hyperkalemia last potassium was 5.0  corrected Problem #3 hypertension his blood pressure is reasonably controlled Problem #4 diabetes Problem #5 suicide ideation Problem #6 metabolic bone disease his calcium and phosphorus is with  in acceptable range. Problem #7 hyponatremia sodium sodium low  Plan: We'll will continue with present  treatment. Basic metabolic panel in am  LOS: 6 days   Joe Vincent S 08/22/2012,8:58 AM

## 2012-08-23 LAB — BASIC METABOLIC PANEL
BUN: 45 mg/dL — ABNORMAL HIGH (ref 6–23)
Creatinine, Ser: 8.57 mg/dL — ABNORMAL HIGH (ref 0.50–1.35)
GFR calc Af Amer: 7 mL/min — ABNORMAL LOW (ref >90)
GFR calc non Af Amer: 6 mL/min — ABNORMAL LOW (ref >90)

## 2012-08-23 LAB — GLUCOSE, CAPILLARY: Glucose-Capillary: 159 mg/dL — ABNORMAL HIGH (ref 70–99)

## 2012-08-23 MED ORDER — SODIUM POLYSTYRENE SULFONATE 15 GM/60ML PO SUSP
30.0000 g | Freq: Once | ORAL | Status: AC
  Administered 2012-08-23: 30 g via ORAL
  Filled 2012-08-23: qty 120

## 2012-08-23 MED ORDER — HYDROMORPHONE HCL PF 1 MG/ML IJ SOLN
2.0000 mg | INTRAMUSCULAR | Status: DC | PRN
  Administered 2012-08-23 – 2012-09-03 (×44): 2 mg via INTRAVENOUS
  Filled 2012-08-23 (×46): qty 2

## 2012-08-23 MED ORDER — CITALOPRAM HYDROBROMIDE 20 MG PO TABS
20.0000 mg | ORAL_TABLET | Freq: Every day | ORAL | Status: DC
  Administered 2012-08-23 – 2012-09-06 (×15): 20 mg via ORAL
  Filled 2012-08-23 (×15): qty 1

## 2012-08-23 NOTE — Progress Notes (Signed)
Tele Psyche consult completed.  Report given to [facility] in regards to what needs to be done if the patient has recommendations for placement at  [facility]: have MD sign involuntary commitment papers, notify Act team if available tonight, fax over documents including to state that the patient is to be on the waiting list.  She verbalized understanding.  I asked Emelda Brothers to notify caroline when the consult recommendations were available.  I also notified Dr. Rito Ehrlich due the psychiatrist called me and stated that last psyche consult he recommended Celexa 20mg  po daily.  New orders placed.

## 2012-08-23 NOTE — Progress Notes (Signed)
Pt refused to take his Kaxycelate because he claimed that his pill medication made him sick on his stomach.  I voiced that I would bring it back to him later.

## 2012-08-23 NOTE — Progress Notes (Addendum)
Notified Dr. Earlyne Iba about the patients request to see him in relation to his pain medication and it not being effective.  MD states that he can see the patient again later this afternoon.  This was related to the patient and he verbalized understanding.  Patient also took 15g of the kayxcylate but refused the other 15 gm stating that he was afraid he would be sick.   I voiced to him that to have the sitter to call me when he is ready to take the other half.  I will continue to follow up with him until he takes the other half.  At 1400 I also went downstairs in the ED and spoke with Amber about using the tele psyhe machine. I asked her to call me  after her patient has been seen.  She verbalized understanding I will continue to f/u with her.

## 2012-08-23 NOTE — Progress Notes (Signed)
Subjective: Interval History: Denies any difficulty in breathing. Presently on dialysis. No nausea or vomiting. Complaints of back pain. He states that overall is feeling better.  Objective: Vital signs in last 24 hours: Temp:  [98 F (36.7 C)-98.6 F (37 C)] 98.1 F (36.7 C) (07/21 0616) Pulse Rate:  [68-73] 68 (07/21 0616) Resp:  [18-20] 20 (07/21 0616) BP: (94-106)/(52-66) 95/52 mmHg (07/21 0616) SpO2:  [99 %-100 %] 99 % (07/21 0616) Weight:  [63.141 kg (139 lb 3.2 oz)] 63.141 kg (139 lb 3.2 oz) (07/21 0616) Weight change: 2.177 kg (4 lb 12.8 oz)  Intake/Output from previous day: 07/20 0701 - 07/21 0700 In: 1540 [P.O.:1540] Out: -  Intake/Output this shift:    General appearance: alert, cooperative and no distress Resp: clear to auscultation bilaterally Cardio: regular rate and rhythm, S1, S2 normal, no murmur, click, rub or gallop GI: soft, non-tender; bowel sounds normal; no masses,  no organomegaly Extremities: extremities normal, atraumatic, no cyanosis or edema  Lab Results:  Recent Labs  08/21/12 0519  WBC 6.0  HGB 11.3*  HCT 34.0*  PLT 140*   BMET:   Recent Labs  08/21/12 0519 08/23/12 0614  NA 130* 128*  K 5.0 5.7*  CL 93* 96  CO2 24 23  GLUCOSE 233* 88  BUN 54* 45*  CREATININE 9.80* 8.57*  CALCIUM 8.5 8.5   No results found for this basename: PTH,  in the last 72 hours Iron Studies: No results found for this basename: IRON, TIBC, TRANSFERRIN, FERRITIN,  in the last 72 hours  Studies/Results: No results found.  I have reviewed the patient's current medications.  Assessment/Plan: Problem #1 end-stage renal disease is status post hemodialysis yesterday , his BUN is 45  andcreatinine 8.57  Problem #2 hyperkalemia last potassium was 5. 7 seems to be increasing. Problem #3 hypertension his blood pressure is reasonably controlled Problem #4 diabetes Problem #5 suicide ideation Problem #6 metabolic bone disease his calcium and phosphorus is with   in acceptable range. Phosphorus is 2.1. Problem #7 anemia his abdomen and hematocrit stable. Plan: [facility] Labrador will continue with present treatment. We will give him Kyoxalate 30 g one dose We'll make arrangements for patient to get dialysis tomorrow. Basic metabolic panel in am  LOS: 7 days   Karmine Kauer S 08/23/2012,7:10 AM

## 2012-08-23 NOTE — Clinical Social Work Note (Signed)
CSW called [facility] to see if bed was available for pt today as it was reported that pt had been on wait list since end of last week. Staff in admissions state no referral was ever made and pt is not on wait list. Notified RN and MD. Requested repeat telepsych since last one completed was last week. Met with pt who reports he came to hospital due to a "nervous breakdown" and SI. When asked how pt is feeling currently, he states he is at "rock bottom." Pt currently lives in his car and is on dialysis. CSW asked if pt continued to have suicidal ideation and he responded "a little bit." His original plan was to drive his car into a tree. CSW questioned pt on current plan and he paused for awhile and then said "I don't know." Awaiting telepsych consult today.   Derenda Fennel, [place] [phone]

## 2012-08-23 NOTE — Progress Notes (Signed)
[facility] PROGRESS NOTE  Joe Vincent ZOX:[id number] DOB: 01-22-1961 DOA: 08/16/2012 PCP: [name], MD  Assessment/Plan: 1. Suicidal ideations. Patient continues to report suicidal ideations. Due to his multiple medical problems, he is felt to be too complex to go to local inpatient psych facilities.  [facility] [place]  They require repeat psychiatric evaluation by psychiatrist to assess if he still is suicidal.  Patient was very agitated when he was informed of this and did not wish to speak to psychiatrist again.  After further discussion, he is now agreeable to do so.   2. End-stage renal disease on hemodialysis, Tuesday Thursday Saturday, nephrology following. 3. Diabetic neuropathy. Patient reports he was previously on dialudid po and was being followed at pain clinic.  Will use morphine for now. 4. Hypertension. Stable 5. Diabetes. Continue to follow blood sugars. 6. Hyperkalemia.  Kayexalate ordered.  Will repeat potassium in PM.  For dialysis in am.    Code Status: full code Family Communication: discussed with patient  Disposition Plan: await transfer to inpatient psych, ACT team following   Consultants:  Nephrology  Telepsychiatry  Procedures:  none  Antibiotics:  none  HPI/Subjective: Patient is agitated today,  Says he is still suicidal.  Upset that he has not been transferred to inpatient psych yet.    Objective: Filed Vitals:   08/22/12 0604 08/22/12 1317 08/22/12 2040 08/23/12 0616  BP: 106/61 106/66 94/57 95/52   Pulse: 67 69 73 68  Temp: 98.4 F (36.9 C) 98 F (36.7 C) 98.6 F (37 C) 98.1 F (36.7 C)  TempSrc: Oral  Oral Oral  Resp: 18 18 20 20   Height:      Weight: 60.374 kg (133 lb 1.6 oz)   63.141 kg (139 lb 3.2 oz)  SpO2: 99% 100% 99% 99%    Intake/Output Summary (Last 24 hours) at 08/23/12 1329 Last data filed at 08/23/12 0800  Gross per 24 hour  Intake   1180 ml  Output      0 ml  Net    1180 ml   Filed Weights   08/21/12 0900 08/22/12 0604 08/23/12 0616  Weight: 60.963 kg (134 lb 6.4 oz) 60.374 kg (133 lb 1.6 oz) 63.141 kg (139 lb 3.2 oz)    Exam:   General:  NAD  Cardiovascular: S1, S2 RRR  Respiratory: CTA B  Abdomen: soft, nt, nd, bs+, ostomy bag in place  Musculoskeletal: no edema b/l   Data Reviewed: Basic Metabolic Panel:  Recent Labs Lab 08/16/12 1725 08/17/12 0746 08/18/12 0540 08/21/12 0519 08/23/12 0614  NA 135 133* 137 130* 128*  K 5.8* 6.7* 4.6 5.0 5.7*  CL 95* 94* 96 93* 96  CO2 26 29 32 24 23  GLUCOSE 250* 137* 68* 233* 88  BUN 42* 50* 21 54* 45*  CREATININE 10.71* 11.90* 7.02* 9.80* 8.57*  CALCIUM 9.3 9.1 9.1 8.5 8.5  PHOS  --   --  4.6 2.7  --    Liver Function Tests:  Recent Labs Lab 08/18/12 0540  AST 41*  ALT 34  ALKPHOS 139*  BILITOT 0.4  PROT 8.1  ALBUMIN 3.7   No results found for this basename: LIPASE, AMYLASE,  in the last 168 hours No results found for this basename: AMMONIA,  in the last 168 hours CBC:  Recent Labs Lab 08/16/12 1725 08/17/12 0746 08/18/12 0540 08/21/12 0519  WBC 9.0 11.1* 9.4 6.0  NEUTROABS 7.0  --   --   --  HGB 13.0 12.6* 12.5* 11.3*  HCT 39.4 39.1 38.6* 34.0*  MCV 95.4 97.3 97.0 94.2  PLT 149* 167 163 140*   Cardiac Enzymes: No results found for this basename: CKTOTAL, CKMB, CKMBINDEX, TROPONINI,  in the last 168 hours BNP (last 3 results)  Recent Labs  08/17/12 0746  PROBNP 3324.0*   CBG:  Recent Labs Lab 08/22/12 1145 08/22/12 1638 08/22/12 2049 08/23/12 0717 08/23/12 1150  GLUCAP 152* 201* 136* 77 144*    Recent Results (from the past 240 hour(s))  URINE CULTURE     Status: None   Collection Time    08/19/12  7:57 PM      Result Value Range Status   Specimen Description URINE, CLEAN CATCH   Final   Special Requests NONE   Final   Culture  Setup Time 08/19/2012 21:00   Final   Colony Count 15,000 COLONIES/ML   Final   Culture     Final   Value:  Multiple bacterial morphotypes present, none predominant. Suggest appropriate recollection if clinically indicated.   Report Status 08/21/2012 FINAL   Final     Studies: No results found.  Scheduled Meds: . diazepam  10 mg Oral BID  . feeding supplement  1 Container Oral TID BM  . heparin  5,000 Units Subcutaneous Q8H  . insulin aspart  0-15 Units Subcutaneous TID WC  . insulin glargine  15 Units Subcutaneous QHS  . multivitamin  1 tablet Oral Daily  . nicotine  21 mg Transdermal Daily  . pantoprazole  40 mg Oral Daily  . sodium chloride  3 mL Intravenous Q12H  . sodium chloride  3 mL Intravenous Q12H  . sucralfate  1 g Oral TID WC & HS   Continuous Infusions:   Principal Problem:   ESRD needing dialysis Active Problems:   Hypertension   Gastritis   CAD (coronary artery disease)   CKD (chronic kidney disease) stage 3, GFR 30-59 ml/min   Nausea and vomiting   Hyperkalemia   Hyponatremia   DM (diabetes mellitus)   Tobacco abuse   Suicide ideation    Time spent:    [facility]  [facility] Pager [phone]. If 7PM-7AM, please contact night-coverage at [email or web address], password [facility] 08/23/2012, 1:29 PM  LOS: 7 days

## 2012-08-23 NOTE — Clinical Social Work Note (Addendum)
Pt still awaiting telepsych. CSW left information for RN to fax in addition to telepsych eval to [facility] if recommendation is inpatient. Left IVC paperwork to be completed with RN if needed as current paperwork expires today.   Derenda Fennel, [place] [phone]

## 2012-08-24 LAB — BASIC METABOLIC PANEL
BUN: 53 mg/dL — ABNORMAL HIGH (ref 6–23)
CO2: 26 mEq/L (ref 19–32)
Chloride: 96 mEq/L (ref 96–112)
Creatinine, Ser: 10.28 mg/dL — ABNORMAL HIGH (ref 0.50–1.35)
Potassium: 6.5 mEq/L (ref 3.5–5.1)

## 2012-08-24 LAB — CBC
HCT: 37.3 % — ABNORMAL LOW (ref 39.0–52.0)
Hemoglobin: 12.2 g/dL — ABNORMAL LOW (ref 13.0–17.0)
MCHC: 32.7 g/dL (ref 30.0–36.0)
MCV: 95.4 fL (ref 78.0–100.0)
RDW: 14.7 % (ref 11.5–15.5)

## 2012-08-24 LAB — POTASSIUM: Potassium: 4.2 mEq/L (ref 3.5–5.1)

## 2012-08-24 LAB — GLUCOSE, CAPILLARY
Glucose-Capillary: 74 mg/dL (ref 70–99)
Glucose-Capillary: 141 mg/dL — ABNORMAL HIGH (ref 70–99)

## 2012-08-24 MED ORDER — SODIUM POLYSTYRENE SULFONATE 15 GM/60ML PO SUSP
60.0000 g | Freq: Once | ORAL | Status: AC
  Administered 2012-08-25: 60 g via ORAL
  Filled 2012-08-24 (×2): qty 60

## 2012-08-24 NOTE — Clinical Social Work Note (Signed)
CSW reviewed telepsych completed last night which continues to recommend inpatient treatment. CSW called ACT team and notified them that pt was not put on the wait list at [facility] last week as reported. Diane to come see pt today.   Derenda Fennel, [place] [phone]

## 2012-08-24 NOTE — Progress Notes (Signed)
Joe Vincent  MRN: [id number]  DOB/AGE: August 08, 1960 52 y.o.  Primary Care Physician:BURDINE,STEVEN E, MD  Admit date: 08/16/2012  Chief Complaint:  Chief Complaint  Patient presents with  . V70.1    Vincent-Pt presented on  08/16/2012 with  Chief Complaint  Patient presents with  . V70.1  .    Pt offers no new complaints besides that he does not want to take kayexalate.   Meds  . citalopram  20 mg Oral Daily  . diazepam  10 mg Oral BID  . feeding supplement  1 Container Oral TID BM  . heparin  5,000 Units Subcutaneous Q8H  . insulin aspart  0-15 Units Subcutaneous TID WC  . insulin glargine  15 Units Subcutaneous QHS  . multivitamin  1 tablet Oral Daily  . nicotine  21 mg Transdermal Daily  . pantoprazole  40 mg Oral Daily  . sodium chloride  3 mL Intravenous Q12H  . sodium chloride  3 mL Intravenous Q12H  . sodium polystyrene  60 g Oral Once  . sucralfate  1 g Oral TID WC & HS      Physical Exam: Vital signs in last 24 hours: Temp:  [98.6 F (37 C)] 98.6 F (37 C) (07/21 1534) Pulse Rate:  [71] 71 (07/21 1534) Resp:  [20] 20 (07/21 1534) BP: (101)/(64) 101/64 mmHg (07/21 1534) SpO2:  [98 %] 98 % (07/21 1534) Weight:  [144 lb 6.4 oz (65.499 kg)] 144 lb 6.4 oz (65.499 kg) (07/22 0311) Weight change: 5 lb 3.2 oz (2.359 kg) Last BM Date: 08/22/12  Intake/Output from previous day: 07/21 0701 - 07/22 0700 In: 240 [P.O.:240] Out: -      Physical Exam: General- pt is awake,alert, oriented to time place and person Resp- No acute REsp distress, CTA B/L NO Rhonchi CVS- S1S2 regular in rate and rhythm GIT- BS+, soft, NT, ND, Bag insitu  EXT- NO LE Edema, Cyanosis Access- Avf  Lab Results: CBC  Recent Labs  08/24/12 0530  WBC 6.8  HGB 12.2*  HCT 37.3*  PLT 129*    BMET  Recent Labs  08/23/12 0614 08/23/12 1708 08/24/12 0530  NA 128*  --  130*  K 5.7* 5.8* 6.5*  CL 96  --  96  CO2 23  --  26  GLUCOSE 88  --  74  BUN 45*  --  53*  CREATININE 8.57*   --  10.28*  CALCIUM 8.5  --  8.7       Lab Results  Component Value Date   PTH 149.2* 05/24/2011   CALCIUM 8.7 08/24/2012   CAION 1.06* 05/22/2011   PHOS 2.7 08/21/2012       Impression: 1)Renal ESRD on HD Hs of NON compliance On TTS schedule Multiple admission at different hospital for diff reasons.  2)HTN BP at goal   3)Anemia HGb at goal (9--11) NO need of Epo   4)CKD Mineral-Bone Disorder PTH acceptable. Secondary Hyperparathyroidism present. Phosphorus at goal.   5)DM- being managed by primary team   6)Electrlytes Hyperkalemic sec to non compliance  Hyponatremic Sec to ESRD -inability to get rid of free wtaer  7)Acid base Co2 at goal   8) Psych- Suicidal ideation Primary team following. ACT team consulted   Plan:   Will dialyze today with 2 k bath as hyperkalemic Tried to educate  Pt about role of kayexalate but pt does not want to take it. Will change dit to low k diet Will check BMet in am  Joe Vincent 08/24/2012, 7:07 AM

## 2012-08-24 NOTE — Progress Notes (Signed)
Faxed required information to [facility] and verified that it has been received. Admission pending review by medical doctor. [facility] referral number: [id number] with dates 7/22-7/28.

## 2012-08-24 NOTE — Progress Notes (Signed)
Called [facility] hotline and asked them to refax the consult recommendations. Obtained the paper and added them to the chart. Made 2 attempts to call Joe Vincent from ACT team but was unable to reach her.

## 2012-08-24 NOTE — Progress Notes (Signed)
[facility] PROGRESS NOTE  Joe Vincent ZOX:[id number] DOB: 1960-02-11 DOA: 08/16/2012 PCP: [name], MD  Assessment/Plan: 1. Suicidal ideations. Psychiatry service recommends inpatient psychiatric transfer. Due to his multiple medical problems, [facility] Awaiting a bed. ACT team arranging this. 2. End-stage renal disease on hemodialysis, Tuesday Thursday Saturday, nephrology following. 3. Diabetic neuropathy. Patient reports he was previously on dialudid po and was being followed at pain clinic.  On iv dilaudid for now.  Can transition to po when ready for discharge. 4. Hypertension. Stable 5. Diabetes. Continue to follow blood sugars. 6. Hyperkalemia.  Patient refused taking kayexalate yesterday.  Hyperkalemia resolved with dialysis today.    Code Status: full code Family Communication: discussed with patient  Disposition Plan: await transfer to inpatient psych, ACT team following, he is medically ready for transfer once bed is available   Consultants:  Nephrology  Telepsychiatry  Procedures:  none  Antibiotics:  none  HPI/Subjective: Pain has improved.  No new complaints today. Still reports that he is suicidal.  Objective: Filed Vitals:   08/24/12 1230 08/24/12 1300 08/24/12 1330 08/24/12 1400  BP: 86/59 112/66 89/66 90/70   Pulse: 63 83 84 85  Temp:      TempSrc:      Resp:      Height:      Weight:      SpO2:        Intake/Output Summary (Last 24 hours) at 08/24/12 1408 Last data filed at 08/24/12 1400  Gross per 24 hour  Intake      0 ml  Output   1100 ml  Net  -1100 ml   Filed Weights   08/22/12 0604 08/23/12 0616 08/24/12 0311  Weight: 60.374 kg (133 lb 1.6 oz) 63.141 kg (139 lb 3.2 oz) 65.499 kg (144 lb 6.4 oz)    Exam:   General:  NAD  Cardiovascular: S1, S2 RRR  Respiratory: CTA B  Abdomen: soft, nt, nd, bs+, ostomy bag in place  Musculoskeletal: no edema b/l   Data  Reviewed: Basic Metabolic Panel:  Recent Labs Lab 08/18/12 0540 08/21/12 0519 08/23/12 0614 08/23/12 1708 08/24/12 0530 08/24/12 1126  NA 137 130* 128*  --  130*  --   K 4.6 5.0 5.7* 5.8* 6.5* 4.2  CL 96 93* 96  --  96  --   CO2 32 24 23  --  26  --   GLUCOSE 68* 233* 88  --  74  --   BUN 21 54* 45*  --  53*  --   CREATININE 7.02* 9.80* 8.57*  --  10.28*  --   CALCIUM 9.1 8.5 8.5  --  8.7  --   PHOS 4.6 2.7  --   --   --   --    Liver Function Tests:  Recent Labs Lab 08/18/12 0540  AST 41*  ALT 34  ALKPHOS 139*  BILITOT 0.4  PROT 8.1  ALBUMIN 3.7   No results found for this basename: LIPASE, AMYLASE,  in the last 168 hours No results found for this basename: AMMONIA,  in the last 168 hours CBC:  Recent Labs Lab 08/18/12 0540 08/21/12 0519 08/24/12 0530  WBC 9.4 6.0 6.8  HGB 12.5* 11.3* 12.2*  HCT 38.6* 34.0* 37.3*  MCV 97.0 94.2 95.4  PLT 163 140* 129*   Cardiac Enzymes: No results found for this basename: CKTOTAL, CKMB, CKMBINDEX, TROPONINI,  in the last 168 hours BNP (last 3  results)  Recent Labs  08/17/12 0746  PROBNP 3324.0*   CBG:  Recent Labs Lab 08/23/12 1150 08/23/12 1646 08/23/12 2036 08/24/12 0732 08/24/12 1135  GLUCAP 144* 184* 159* 74 95    Recent Results (from the past 240 hour(s))  URINE CULTURE     Status: None   Collection Time    08/19/12  7:57 PM      Result Value Range Status   Specimen Description URINE, CLEAN CATCH   Final   Special Requests NONE   Final   Culture  Setup Time 08/19/2012 21:00   Final   Colony Count 15,000 COLONIES/ML   Final   Culture     Final   Value: Multiple bacterial morphotypes present, none predominant. Suggest appropriate recollection if clinically indicated.   Report Status 08/21/2012 FINAL   Final     Studies: No results found.  Scheduled Meds: . citalopram  20 mg Oral Daily  . diazepam  10 mg Oral BID  . feeding supplement  1 Container Oral TID BM  . heparin  5,000 Units  Subcutaneous Q8H  . insulin aspart  0-15 Units Subcutaneous TID WC  . insulin glargine  15 Units Subcutaneous QHS  . multivitamin  1 tablet Oral Daily  . nicotine  21 mg Transdermal Daily  . pantoprazole  40 mg Oral Daily  . sodium chloride  3 mL Intravenous Q12H  . sodium chloride  3 mL Intravenous Q12H  . sodium polystyrene  60 g Oral Once  . sucralfate  1 g Oral TID WC & HS   Continuous Infusions:   Principal Problem:   ESRD needing dialysis Active Problems:   Hypertension   Gastritis   CAD (coronary artery disease)   CKD (chronic kidney disease) stage 3, GFR 30-59 ml/min   Nausea and vomiting   Hyperkalemia   Hyponatremia   DM (diabetes mellitus)   Tobacco abuse   Suicide ideation    Time spent:    [facility]  [facility] Pager [phone]. If 7PM-7AM, please contact night-coverage at [email or web address], password [facility] 08/24/2012, 2:08 PM  LOS: 8 days

## 2012-08-25 LAB — BASIC METABOLIC PANEL
BUN: 29 mg/dL — ABNORMAL HIGH (ref 6–23)
CO2: 28 mEq/L (ref 19–32)
Calcium: 8.4 mg/dL (ref 8.4–10.5)
Chloride: 94 mEq/L — ABNORMAL LOW (ref 96–112)
Creatinine, Ser: 7.04 mg/dL — ABNORMAL HIGH (ref 0.50–1.35)
GFR calc Af Amer: 9 mL/min — ABNORMAL LOW (ref >90)
GFR calc non Af Amer: 8 mL/min — ABNORMAL LOW (ref >90)
Glucose, Bld: 135 mg/dL — ABNORMAL HIGH (ref 70–99)
Potassium: 5.4 mEq/L — ABNORMAL HIGH (ref 3.5–5.1)
Sodium: 131 mEq/L — ABNORMAL LOW (ref 135–145)

## 2012-08-25 LAB — GLUCOSE, CAPILLARY
Glucose-Capillary: 133 mg/dL — ABNORMAL HIGH (ref 70–99)
Glucose-Capillary: 158 mg/dL — ABNORMAL HIGH (ref 70–99)
Glucose-Capillary: 200 mg/dL — ABNORMAL HIGH (ref 70–99)
Glucose-Capillary: 156 mg/dL — ABNORMAL HIGH (ref 70–99)

## 2012-08-25 NOTE — Progress Notes (Signed)
Patient seen and examined. Patient is medically stable for discharge but his dialysis needs to be arranged prior to discharge to inpatient psychiatric unit. Continue with monitoring of renal function and inpatient dialysis for the time being.

## 2012-08-25 NOTE — Progress Notes (Signed)
UR chart review completed.  

## 2012-08-25 NOTE — Progress Notes (Signed)
Joe Vincent  MRN: [id number]  DOB/AGE: February 21, 1960 52 y.o.  Primary Care Physician:BURDINE,STEVEN E, MD  Admit date: 08/16/2012  Chief Complaint:  Chief Complaint  Patient presents with  . V70.1    S-Pt presented on  08/16/2012 with  Chief Complaint  Patient presents with  . V70.1  .    Pt offers no new complaints besides that he does not want to take dialysis today again and will rather take kayexalate.   Meds  . citalopram  20 mg Oral Daily  . diazepam  10 mg Oral BID  . feeding supplement  1 Container Oral TID BM  . heparin  5,000 Units Subcutaneous Q8H  . insulin aspart  0-15 Units Subcutaneous TID WC  . insulin glargine  15 Units Subcutaneous QHS  . multivitamin  1 tablet Oral Daily  . nicotine  21 mg Transdermal Daily  . pantoprazole  40 mg Oral Daily  . sodium chloride  3 mL Intravenous Q12H  . sodium chloride  3 mL Intravenous Q12H  . sodium polystyrene  60 g Oral Once  . sucralfate  1 g Oral TID WC & HS      Physical Exam: Vital signs in last 24 hours: Temp:  [97.9 F (36.6 C)-98.4 F (36.9 C)] 98.4 F (36.9 C) (07/23 0705) Pulse Rate:  [63-85] 75 (07/23 0705) Resp:  [18-20] 20 (07/23 0705) BP: (85-127)/(50-72) 127/72 mmHg (07/23 0705) SpO2:  [98 %-99 %] 98 % (07/23 0705) Weight:  [140 lb 6.4 oz (63.685 kg)] 140 lb 6.4 oz (63.685 kg) (07/23 0420) Weight change: -4 lb (-1.814 kg) Last BM Date: 08/24/12  Intake/Output from previous day: 07/22 0701 - 07/23 0700 In: 2040 [P.O.:2040] Out: 1100      Physical Exam: General- pt is awake,alert, oriented to time place and person Resp- No acute REsp distress, CTA B/L NO Rhonchi CVS- S1S2 regular in rate and rhythm GIT- BS+, soft, NT, ND, Bag insitu  EXT- NO LE Edema, Cyanosis Access- Avf  Lab Results: CBC  Recent Labs  08/24/12 0530  WBC 6.8  HGB 12.2*  HCT 37.3*  PLT 129*    BMET  Recent Labs  08/24/12 0530 08/24/12 1126 08/25/12 0512  NA 130*  --  131*  K 6.5* 4.2 5.4*  CL 96  --   94*  CO2 26  --  28  GLUCOSE 74  --  135*  BUN 53*  --  29*  CREATININE 10.28*  --  7.04*  CALCIUM 8.7  --  8.4       Lab Results  Component Value Date   PTH 149.2* 05/24/2011   CALCIUM 8.4 08/25/2012   CAION 1.06* 05/22/2011   PHOS 2.7 08/21/2012       Impression: 1)Renal ESRD on HD Hs of NON compliance On TTS schedule Multiple admission at different hospital for diff reasons. Pt dialyzed yesterday   2)HTN BP at goal   3)Anemia HGb at goal (9--11) NO need of Epo   4)CKD Mineral-Bone Disorder PTH acceptable. Secondary Hyperparathyroidism present. Phosphorus at goal.   5)DM- being managed by primary team   6)Electrlytes Hyperkalemic sec to non compliance Better after Hd yesterday  But High again today. Pt has been refusing kayexalate but said will take it today Pt does not want to go an extra Hd tx today.  Hyponatremic Sec to ESRD -inability to get rid of free wtaer  7)Acid base Co2 at goal   8) Psych- Suicidal ideation Primary team following. ACT team consulted  Plan:  will dialyze in am. Will follow Bmet .     Lilia Letterman S 08/25/2012, 8:03 AM

## 2012-08-25 NOTE — Clinical Social Work Note (Addendum)
Per RN at [facility], pt will have to have outpatient dialysis arranged at a local facility in order for pt to even be put on the wait list. CSW spoke with [facility] (pt goes to [place]) and they are working on faxing paperwork to hopefully have pt transferred to [facility] in [place] or [place] which would be acceptable per [facility]. Acceptance would be pending MD review at [facility]. CSW will continue to facilitate. CSW asked pt about ALF placement because most of his depression appears to be related to being homeless. Pt states he has already explored that and is not interested because they take most of your check. Pt said he is currently trying to save up funds for an apartment at some point.   Derenda Fennel, [place] [phone]

## 2012-08-25 NOTE — Clinical Social Work Note (Signed)
CSW faxed most recent telepsych and MD note to [facility] to forward to potential accepting facilities near [facility]. Discussed above with supervisor.  Derenda Fennel, [place] [phone]

## 2012-08-26 LAB — BASIC METABOLIC PANEL
BUN: 43 mg/dL — ABNORMAL HIGH (ref 6–23)
CO2: 24 mEq/L (ref 19–32)
Calcium: 8.7 mg/dL (ref 8.4–10.5)
Chloride: 93 mEq/L — ABNORMAL LOW (ref 96–112)
Creatinine, Ser: 8.96 mg/dL — ABNORMAL HIGH (ref 0.50–1.35)
GFR calc Af Amer: 7 mL/min — ABNORMAL LOW (ref >90)
GFR calc non Af Amer: 6 mL/min — ABNORMAL LOW (ref >90)
Glucose, Bld: 95 mg/dL (ref 70–99)
Potassium: 6 mEq/L — ABNORMAL HIGH (ref 3.5–5.1)
Sodium: 128 mEq/L — ABNORMAL LOW (ref 135–145)

## 2012-08-26 LAB — GLUCOSE, CAPILLARY
Glucose-Capillary: 79 mg/dL (ref 70–99)
Glucose-Capillary: 159 mg/dL — ABNORMAL HIGH (ref 70–99)

## 2012-08-26 MED ORDER — SODIUM CHLORIDE 0.9 % IV SOLN: 100.0000 mL | INTRAVENOUS | Status: DC | PRN

## 2012-08-26 MED ORDER — ALTEPLASE 2 MG IJ SOLR
2.0000 mg | Freq: Once | INTRAMUSCULAR | Status: AC | PRN
  Filled 2012-08-26: qty 2

## 2012-08-26 NOTE — Procedures (Signed)
   HEMODIALYSIS TREATMENT NOTE:  4 hour HD session completed through right upper arm AVF.  Goal NOT met:  BP unable to tolerate removal of 1 liter as prescribed. Although asymptomatic, ultrafiltration was interrupted for 47 minutes for SBP<90.  Net UF 577cc.  All blood was reinfused.  Hemostasis was achieved within 14 minutes.  Report given to Darvin Neighbours, RN.  Deriana Vanderhoef L. Rice Walsh, RN, CDN

## 2012-08-26 NOTE — Progress Notes (Signed)
Subjective: Interval History: Denies any difficulty in breathing. Presently on dialysis. No nausea or vomiting. Complaints of back pain. He states that overall is feeling better. His appetite is still poor.  Objective: Vital signs in last 24 hours: Temp:  [97.4 F (36.3 C)-98.2 F (36.8 C)] 98 F (36.7 C) (07/24 0930) Pulse Rate:  [68-79] 72 (07/24 1045) Resp:  [20] 20 (07/24 0930) BP: (87-116)/(57-74) 97/57 mmHg (07/24 1045) SpO2:  [99 %-100 %] 99 % (07/24 0930) Weight:  [65 kg (143 lb 4.8 oz)-65.046 kg (143 lb 6.4 oz)] 65 kg (143 lb 4.8 oz) (07/24 0930) Weight change: 1.361 kg (3 lb)  Intake/Output from previous day: 07/23 0701 - 07/24 0700 In: 1260 [P.O.:1260] Out: -  Intake/Output this shift:    General appearance: alert, cooperative and no distress Resp: clear to auscultation bilaterally Cardio: regular rate and rhythm, S1, S2 normal, no murmur, click, rub or gallop GI: soft, non-tender; bowel sounds normal; no masses,  no organomegaly Extremities: extremities normal, atraumatic, no cyanosis or edema  Lab Results:  Recent Labs  08/24/12 0530  WBC 6.8  HGB 12.2*  HCT 37.3*  PLT 129*   BMET:   Recent Labs  08/25/12 0512 08/26/12 0534  NA 131* 128*  K 5.4* 6.0*  CL 94* 93*  CO2 28 24  GLUCOSE 135* 95  BUN 29* 43*  CREATININE 7.04* 8.96*  CALCIUM 8.4 8.7   No results found for this basename: PTH,  in the last 72 hours Iron Studies: No results found for this basename: IRON, TIBC, TRANSFERRIN, FERRITIN,  in the last 72 hours  Studies/Results: No results found.  I have reviewed the patient's current medications.  Assessment/Plan: Problem #1 end-stage renal disease is status post hemodialysis the day before yesterday , his BUN is 43  andcreatinine 8.96  Problem #2 hyperkalemia last potassium was 6  seems to be increasing daily. Patient is on 2K baths Problem #3 hypertension his blood pressure is reasonably controlled Problem #4 diabetes Problem #5  suicide ideation Problem #6 metabolic bone disease his calcium and phosphorus is with  in acceptable range. Phosphorus is 2.1. Problem #7 anemia his hemoglobin and hematocrit stable. Plan: Maryclare Labrador will continue with present treatment. We change his diet to renal diet. We'll continue with low potassium diet. Basic metabolic panel and phosphorus in am  LOS: 10 days   Tanvir Hipple S 08/26/2012,10:59 AM

## 2012-08-26 NOTE — Progress Notes (Signed)
Patient psych evaluation completed, notified Dr. Karilyn Cota of results.

## 2012-08-26 NOTE — Progress Notes (Signed)
  Joe Vincent WJX:[id number] DOB: 25-Mar-1960 DOA: 08/16/2012 PCP: [name], MD   Subjective: This patient has no specific complaints apart from being continually suicidal. He is being reevaluated by psychiatry. We are waiting for placement.           Physical Exam: Blood pressure 107/59, pulse 68, temperature 97.4 F (36.3 C), temperature source Oral, resp. rate 20, height 6' (1.829 m), weight 65.046 kg (143 lb 6.4 oz), SpO2 100.00%. He looks systemically well. Heart sounds are present and normal without murmurs. Lung fields are clear. He is alert and orientated.   Investigations:  Recent Results (from the past 240 hour(s))  URINE CULTURE     Status: None   Collection Time    08/19/12  7:57 PM      Result Value Range Status   Specimen Description URINE, CLEAN CATCH   Final   Special Requests NONE   Final   Culture  Setup Time 08/19/2012 21:00   Final   Colony Count 15,000 COLONIES/ML   Final   Culture     Final   Value: Multiple bacterial morphotypes present, none predominant. Suggest appropriate recollection if clinically indicated.   Report Status 08/21/2012 FINAL   Final     Basic Metabolic Panel:  Recent Labs  21/30/86 0512 08/26/12 0534  NA 131* 128*  K 5.4* 6.0*  CL 94* 93*  CO2 28 24  GLUCOSE 135* 95  BUN 29* 43*  CREATININE 7.04* 8.96*  CALCIUM 8.4 8.7       CBC:  Recent Labs  08/24/12 0530  WBC 6.8  HGB 12.2*  HCT 37.3*  MCV 95.4  PLT 129*    No results found.    Medications: I have reviewed the patient's current medications.  Impression: 1. End-stage renal disease on hemodialysis, due for hemodialysis today. 2. Suicidal ideation. He will need inpatient psychiatric care. 3. Diabetic neuropathy. 4. Hypertension, stable. 5. Diabetes, stable. 6. Hyperkalemia, secondary to end-stage renal disease, being controlled by dialysis and Kayexalate, which he intermittently refuses to take.     Plan: 1. Dialysis today. 2. Await  further input from psychiatry and social work regarding placement. Patient is medically stable for discharge.  Consultants:  Tele psychiatry.   Procedures:  None.   Antibiotics:  None.                   Code Status: Full code.  Family Communication: Discussed plan with patient at bedside.   Disposition Plan: Inpatient psychiatric unit when a bed is found.  Time spent: 10 minutes    LOS: 10 days   Wilson Singer Pager [phone]  08/26/2012, 8:42 AM

## 2012-08-26 NOTE — Clinical Social Work Note (Addendum)
CSW spoke with Adailia at [facility] who confirms referral received. She requested dialysis flowsheets which CSW faxed. Per Erskine Squibb at [facility], staff would provide transport and sit with pt during treatment. Adailia notified. Erskine Squibb states once dialysis is set up, referral can go for MD review. She indicates no guarantee of pt being accepted due to complex medical issues. CSW to request repeat telepsych as pt is approaching 48 hours since last assessment.   Derenda Fennel, [place] [phone]

## 2012-08-26 NOTE — Clinical Social Work Note (Signed)
CSW received confirmation from [place], Child psychotherapist at [facility] in [place] that pt has been accepted by Dr. Vivi Martens there on Tuesday, Thursday, Saturday schedule at 11:00. CSW forwarded information to [facility] and they confirm that pt is now officially on wait list per Okey Regal. Estimated wait is 1.5-2 weeks. Awaiting repeat telepsych later today.   Derenda Fennel, [place] [phone]

## 2012-08-26 NOTE — Progress Notes (Signed)
Nutrition Follow-up   INTERVENTION: Discontinue oral supplements; pt is refusing  NUTRITION DIAGNOSIS: Inadequate oral intake; resolved   Goal: Pt to meet >/= 90% of their estimated nutrition needs; met  Monitor:  Po intake, labs, I/O's and wt trends  52 y.o. male  Admitting Dx: ESRD needing dialysis  ASSESSMENT: Pt is waiting placement. Po meal records show improved appetite and intake. Nursing reports pt is eating well but not taking the oral supplements.   Height: Ht Readings from Last 1 Encounters:  08/17/12 6' (1.829 m)    Weight: Wt Readings from Last 1 Encounters:  08/26/12 143 lb 4.8 oz (65 kg)    Ideal Body Weight: 178# (80.9 kg)  % Ideal Body Weight: 80%  Wt Readings from Last 10 Encounters:  08/26/12 143 lb 4.8 oz (65 kg)  08/28/11 175 lb (79.379 kg)  05/26/11 167 lb (75.751 kg)  05/26/11 167 lb (75.751 kg)  05/26/11 167 lb (75.751 kg)    Usual Body Weight: 165-175#  % Usual Body Weight: 87%  BMI:  Body mass index is 19.43 kg/(m^2). normal range  Estimated Nutritional Needs: Kcal: 0272-5366 Protein: 80-90 gr Fluid: 1000 ml plus output  Skin: No issues noted  Diet Order: Renal   EDUCATION NEEDS: -Education needs addressed   Intake/Output Summary (Last 24 hours) at 08/26/12 1404 Last data filed at 08/25/12 1720  Gross per 24 hour  Intake   1010 ml  Output      0 ml  Net   1010 ml    Last BM: 08/26/12  Labs:   Recent Labs Lab 08/21/12 0519  08/24/12 0530 08/24/12 1126 08/25/12 0512 08/26/12 0534  NA 130*  < > 130*  --  131* 128*  K 5.0  < > 6.5* 4.2 5.4* 6.0*  CL 93*  < > 96  --  94* 93*  CO2 24  < > 26  --  28 24  BUN 54*  < > 53*  --  29* 43*  CREATININE 9.80*  < > 10.28*  --  7.04* 8.96*  CALCIUM 8.5  < > 8.7  --  8.4 8.7  PHOS 2.7  --   --   --   --   --   GLUCOSE 233*  < > 74  --  135* 95  < > = values in this interval not displayed.  CBG (last 3)   Recent Labs  08/25/12 1627 08/25/12 2154 08/26/12 0747   GLUCAP 200* 156* 124*    Scheduled Meds: . citalopram  20 mg Oral Daily  . diazepam  10 mg Oral BID  . feeding supplement  1 Container Oral TID BM  . heparin  5,000 Units Subcutaneous Q8H  . insulin aspart  0-15 Units Subcutaneous TID WC  . insulin glargine  15 Units Subcutaneous QHS  . multivitamin  1 tablet Oral Daily  . nicotine  21 mg Transdermal Daily  . pantoprazole  40 mg Oral Daily  . sodium chloride  3 mL Intravenous Q12H  . sodium chloride  3 mL Intravenous Q12H  . sucralfate  1 g Oral TID WC & HS    Continuous Infusions:   Past Medical History  Diagnosis Date  . Diabetes mellitus   . Hypertension   . GERD (gastroesophageal reflux disease)   . Retinopathy   . Peripheral neuropathy   . Chronic kidney disease   . Chest pain     a. 05/2011 Cath: nonobs dzs.  . Myocardial infarction 2000  .  Depression   . Liver disease   . Cancer     Colon    Past Surgical History  Procedure Laterality Date  . Colostomy  Ileostomy    colon cancer  . Amputation      left pinky toe  . Esophagogastroduodenoscopy  05/23/2011    Procedure: ESOPHAGOGASTRODUODENOSCOPY (EGD);  Surgeon: Theda Belfast, MD;  Location: [facility] ENDOSCOPY;  Service: Endoscopy;  Laterality: N/A;  . Colon surgery  2009  . Cardiac catheterization  2012  . Eye surgery      Royann Shivers MS,RD,LDN,CSG Office: #[phone] Pager: [phone]

## 2012-08-27 LAB — BASIC METABOLIC PANEL
Chloride: 94 mEq/L — ABNORMAL LOW (ref 96–112)
GFR calc Af Amer: 13 mL/min — ABNORMAL LOW (ref >90)
GFR calc non Af Amer: 11 mL/min — ABNORMAL LOW (ref >90)
Potassium: 4.5 mEq/L (ref 3.5–5.1)
Sodium: 130 mEq/L — ABNORMAL LOW (ref 135–145)

## 2012-08-27 LAB — GLUCOSE, CAPILLARY
Glucose-Capillary: 182 mg/dL — ABNORMAL HIGH (ref 70–99)
Glucose-Capillary: 238 mg/dL — ABNORMAL HIGH (ref 70–99)

## 2012-08-27 LAB — PHOSPHORUS: Phosphorus: 3.5 mg/dL (ref 2.3–4.6)

## 2012-08-27 NOTE — Clinical Social Work Note (Signed)
Telepsych yesterday continues to recommend inpatient. Pt on wait list at [facility]. Pt will require repeat telepsych Saturday.  Derenda Fennel, [place] [phone]

## 2012-08-27 NOTE — Progress Notes (Signed)
  Joe Vincent ZOX:[id number] DOB: 1960/08/30 DOA: 08/16/2012 PCP: [name], MD   Subjective: This patient has no specific complaints apart from being continually suicidal. He was reevaluated by psychiatry yesterday and still felt to be suicidal and requiring inpatient psychiatric care.           Physical Exam: Blood pressure 109/49, pulse 69, temperature 98.5 F (36.9 C), temperature source Oral, resp. rate 20, height 6' (1.829 m), weight 65 kg (143 lb 4.8 oz), SpO2 98.00%. He looks systemically well. Heart sounds are present and normal without murmurs. Lung fields are clear. He is alert and orientated.   Investigations:  Recent Results (from the past 240 hour(s))  URINE CULTURE     Status: None   Collection Time    08/19/12  7:57 PM      Result Value Range Status   Specimen Description URINE, CLEAN CATCH   Final   Special Requests NONE   Final   Culture  Setup Time 08/19/2012 21:00   Final   Colony Count 15,000 COLONIES/ML   Final   Culture     Final   Value: Multiple bacterial morphotypes present, none predominant. Suggest appropriate recollection if clinically indicated.   Report Status 08/21/2012 FINAL   Final     Basic Metabolic Panel:  Recent Labs  81/19/14 0534 08/27/12 0447  NA 128* 130*  K 6.0* 4.5  CL 93* 94*  CO2 24 30  GLUCOSE 95 167*  BUN 43* 19  CREATININE 8.96* 5.44*  CALCIUM 8.7 8.4  PHOS  --  3.5             Medications: I have reviewed the patient's current medications.  Impression: 1. End-stage renal disease on hemodialysis. 2. Suicidal ideation. He will need inpatient psychiatric care. 3. Diabetic neuropathy. 4. Hypertension, stable. 5. Diabetes, stable. 6. Hyperkalemia, secondary to end-stage renal disease, being controlled by dialysis and Kayexalate, which he intermittently refuses to take.     Plan: 1. Continue with hemodialysis as scheduled. 2. Await disposition for inpatient psychiatric care for suicidal  ideation.  Consultants:  Tele psychiatry.   Procedures:  None.   Antibiotics:  None.                   Code Status: Full code.  Family Communication: Discussed plan with patient at bedside.   Disposition Plan: Inpatient psychiatric unit when a bed is found.  Time spent: 10 minutes    LOS: 11 days   Wilson Singer Pager [phone]  08/27/2012, 6:56 AM

## 2012-08-27 NOTE — Progress Notes (Signed)
Subjective: Interval History: Denies any difficulty in breathing. Presently on dialysis. No nausea or vomiting. Complaints of back pain. He states that overall is feeling better. His appetite is still poor.  Objective: Vital signs in last 24 hours: Temp:  [97.8 F (36.6 C)-98.5 F (36.9 C)] 98.5 F (36.9 C) (07/25 0500) Pulse Rate:  [64-79] 69 (07/25 0500) Resp:  [20] 20 (07/25 0500) BP: (86-132)/(49-74) 109/49 mmHg (07/25 0500) SpO2:  [98 %-99 %] 98 % (07/25 0500) Weight:  [65 kg (143 lb 4.8 oz)] 65 kg (143 lb 4.8 oz) (07/25 0500) Weight change: -0.046 kg (-1.6 oz)  Intake/Output from previous day: 07/24 0701 - 07/25 0700 In: 600 [P.O.:600] Out: 577  Intake/Output this shift:    General appearance: alert, cooperative and no distress Resp: clear to auscultation bilaterally Cardio: regular rate and rhythm, S1, S2 normal, no murmur, click, rub or gallop GI: soft, non-tender; bowel sounds normal; no masses,  no organomegaly Extremities: extremities normal, atraumatic, no cyanosis or edema  Lab Results: No results found for this basename: WBC, HGB, HCT, PLT,  in the last 72 hours BMET:   Recent Labs  08/26/12 0534 08/27/12 0447  NA 128* 130*  K 6.0* 4.5  CL 93* 94*  CO2 24 30  GLUCOSE 95 167*  BUN 43* 19  CREATININE 8.96* 5.44*  CALCIUM 8.7 8.4   No results found for this basename: PTH,  in the last 72 hours Iron Studies: No results found for this basename: IRON, TIBC, TRANSFERRIN, FERRITIN,  in the last 72 hours  Studies/Results: No results found.  I have reviewed the patient's current medications.  Assessment/Plan: Problem #1 end-stage renal disease is status post hemodialysis the day yesterday , his BUN is 19  andcreatinine 5.44  Problem #2 hyperkalemia last potassium was 4.5 is corrected. Patient is on 2K baths Problem #3 hypertension his blood pressure is reasonably controlled Problem #4 diabetes Problem #5 suicide ideation Problem #6 metabolic bone  disease his calcium and phosphorus is with  in acceptable range. Phosphorus is 3.5. Problem #7 anemia his hemoglobin and hematocrit stable. Plan: Maryclare Labrador will continue with present treatment. For dialysis in am BMET and cbc in am  LOS: 11 days   Eldean Nanna S 08/27/2012,7:44 AM

## 2012-08-28 LAB — COMPREHENSIVE METABOLIC PANEL
AST: 46 U/L — ABNORMAL HIGH (ref 0–37)
CO2: 25 mEq/L (ref 19–32)
Calcium: 8 mg/dL — ABNORMAL LOW (ref 8.4–10.5)
Creatinine, Ser: 7.21 mg/dL — ABNORMAL HIGH (ref 0.50–1.35)
GFR calc non Af Amer: 8 mL/min — ABNORMAL LOW (ref >90)
Total Protein: 6.5 g/dL (ref 6.0–8.3)

## 2012-08-28 LAB — CBC
MCH: 31.3 pg (ref 26.0–34.0)
MCHC: 33.2 g/dL (ref 30.0–36.0)
MCV: 94.3 fL (ref 78.0–100.0)
Platelets: 156 10*3/uL (ref 150–400)
RBC: 3.51 MIL/uL — ABNORMAL LOW (ref 4.22–5.81)
RDW: 13.9 % (ref 11.5–15.5)

## 2012-08-28 LAB — GLUCOSE, CAPILLARY
Glucose-Capillary: 128 mg/dL — ABNORMAL HIGH (ref 70–99)
Glucose-Capillary: 185 mg/dL — ABNORMAL HIGH (ref 70–99)

## 2012-08-28 MED ORDER — SODIUM CHLORIDE 0.9 % IV SOLN: 100.0000 mL | INTRAVENOUS | Status: DC | PRN

## 2012-08-28 MED ORDER — ALTEPLASE 2 MG IJ SOLR
2.0000 mg | Freq: Once | INTRAMUSCULAR | Status: AC | PRN
  Filled 2012-08-28: qty 2

## 2012-08-28 NOTE — Progress Notes (Signed)
Joe Vincent  MRN: [id number]  DOB/AGE: Jun 17, 1960 52 y.o.  Primary Care Physician:BURDINE,STEVEN E, MD  Admit date: 08/16/2012  Chief Complaint:  Chief Complaint  Patient presents with  . V70.1    S-Pt presented on  08/16/2012 with  Chief Complaint  Patient presents with  . V70.1  .    Pt offers no new complaints.    Pt says " I am waiting for bed at [place]"  Meds  . b complex-vitamin c-folic acid  1 tablet Oral Daily  . citalopram  20 mg Oral Daily  . diazepam  10 mg Oral BID  . heparin  5,000 Units Subcutaneous Q8H  . insulin aspart  0-15 Units Subcutaneous TID WC  . insulin glargine  15 Units Subcutaneous QHS  . nicotine  21 mg Transdermal Daily  . pantoprazole  40 mg Oral Daily  . sodium chloride  3 mL Intravenous Q12H  . sodium chloride  3 mL Intravenous Q12H  . sucralfate  1 g Oral TID WC & HS      Physical Exam: Vital signs in last 24 hours: Temp:  [98.1 F (36.7 C)-99 F (37.2 C)] 98.2 F (36.8 C) (07/26 0529) Pulse Rate:  [64-71] 65 (07/26 0529) Resp:  [19-20] 20 (07/26 0529) BP: (131-147)/(65-68) 147/65 mmHg (07/26 0529) SpO2:  [99 %-100 %] 99 % (07/26 0529) Weight:  [143 lb 1.3 oz (64.9 kg)] 143 lb 1.3 oz (64.9 kg) (07/26 0529) Weight change: -3.5 oz (-0.1 kg) Last BM Date: 08/28/12  Intake/Output from previous day: 07/25 0701 - 07/26 0700 In: 2280 [P.O.:2280] Out: -  Total I/O In: 480 [P.O.:480] Out: -    Physical Exam: General- pt is awake,alert, oriented to time place and person Resp- No acute REsp distress, CTA B/L NO Rhonchi CVS- S1S2 regular in rate and rhythm GIT- BS+, soft, NT, ND, Bag insitu  EXT- NO LE Edema, Cyanosis Access- Avf  Lab Results: CBC  Recent Labs  08/28/12 0631  WBC 4.6  HGB 11.0*  HCT 33.1*  PLT 156    BMET  Recent Labs  08/27/12 0447 08/28/12 0631  NA 130* 126*  K 4.5 5.7*  CL 94* 92*  CO2 30 25  GLUCOSE 167* 124*  BUN 19 30*  CREATININE 5.44* 7.21*  CALCIUM 8.4 8.0*       Lab  Results  Component Value Date   PTH 149.2* 05/24/2011   CALCIUM 8.0* 08/28/2012   CAION 1.06* 05/22/2011   PHOS 3.5 08/27/2012       Impression: 1)Renal ESRD on HD Hs of NON compliance On TTS schedule Multiple admission at different hospital . Pt is to be dialyzed today   2)HTN BP at goal   3)Anemia HGb at goal (9--11) NO need of Epo   4)CKD Mineral-Bone Disorder PTH acceptable. Secondary Hyperparathyroidism present. Phosphorus at goal.   5)DM- being managed by primary team   6)Electrlytes Hyperkalemic sec to non compliance Will dialyze today  Hyponatremic Sec to ESRD -inability to get rid of free water and Non compliance   7)Acid base Co2 at goal   8) Psych- Suicidal ideation Primary team following.    Plan:  will dialyze today Will follow bmet     Eleni Frank S 08/28/2012, 8:47 AM

## 2012-08-28 NOTE — Procedures (Signed)
    HEMODIALYSIS TREATMENT NOTE:  3.5 hour HD session completed via right upper arm AVF (15g/antegrade).  GOAL MET:  Tolerated removal of 2.2 liters with no interruption in ultrafiltration.  All blood was reinfused.  Hemostasis was achieved within 15 minutes.  Report given to Achilles Dunk, RN.  Wilberto Console L. Jareb Radoncic, RN, CDN

## 2012-08-28 NOTE — Progress Notes (Signed)
  Joe Vincent AVW:[id number] DOB: Apr 09, 1960 DOA: 08/16/2012 PCP: [name], MD   Subjective: This patient has no specific complaints apart from being continually suicidal. He was reevaluated by psychiatry  and still felt to be suicidal and requiring inpatient psychiatric care.           Physical Exam: Blood pressure 147/65, pulse 65, temperature 98.2 F (36.8 C), temperature source Oral, resp. rate 20, height 6' (1.829 m), weight 64.9 kg (143 lb 1.3 oz), SpO2 99.00%. He looks systemically well. Heart sounds are present and normal without murmurs. Lung fields are clear. He is alert and orientated.   Investigations:  Recent Results (from the past 240 hour(s))  URINE CULTURE     Status: None   Collection Time    08/19/12  7:57 PM      Result Value Range Status   Specimen Description URINE, CLEAN CATCH   Final   Special Requests NONE   Final   Culture  Setup Time 08/19/2012 21:00   Final   Colony Count 15,000 COLONIES/ML   Final   Culture     Final   Value: Multiple bacterial morphotypes present, none predominant. Suggest appropriate recollection if clinically indicated.   Report Status 08/21/2012 FINAL   Final     Basic Metabolic Panel:  Recent Labs  82/95/62 0447 08/28/12 0631  NA 130* 126*  K 4.5 5.7*  CL 94* 92*  CO2 30 25  GLUCOSE 167* 124*  BUN 19 30*  CREATININE 5.44* 7.21*  CALCIUM 8.4 8.0*  PHOS 3.5  --              Medications: I have reviewed the patient's current medications.  Impression: 1. End-stage renal disease on hemodialysis. 2. Suicidal ideation. He will need inpatient psychiatric care. 3. Diabetic neuropathy. 4. Hypertension, stable. 5. Diabetes, stable. 6. Hyperkalemia, secondary to end-stage renal disease, being controlled by dialysis and Kayexalate, which he intermittently refuses to take.     Plan: 1. Continue with hemodialysis as scheduled. 2. Await disposition for inpatient psychiatric care for suicidal  ideation.  Consultants:  Tele psychiatry.   Procedures:  None.   Antibiotics:  None.                   Code Status: Full code.  Family Communication: Discussed plan with patient at bedside.   Disposition Plan: Inpatient psychiatric unit when a bed is found.  Time spent: 10 minutes    LOS: 12 days   Wilson Singer Pager [phone]  08/28/2012, 8:33 AM

## 2012-08-29 LAB — GLUCOSE, CAPILLARY
Glucose-Capillary: 131 mg/dL — ABNORMAL HIGH (ref 70–99)
Glucose-Capillary: 174 mg/dL — ABNORMAL HIGH (ref 70–99)
Glucose-Capillary: 116 mg/dL — ABNORMAL HIGH (ref 70–99)

## 2012-08-29 LAB — BASIC METABOLIC PANEL
BUN: 20 mg/dL (ref 6–23)
CO2: 27 mEq/L (ref 19–32)
Calcium: 8.4 mg/dL (ref 8.4–10.5)
Chloride: 97 mEq/L (ref 96–112)
Creatinine, Ser: 5.67 mg/dL — ABNORMAL HIGH (ref 0.50–1.35)
GFR calc Af Amer: 12 mL/min — ABNORMAL LOW (ref >90)
GFR calc non Af Amer: 10 mL/min — ABNORMAL LOW (ref >90)
Glucose, Bld: 122 mg/dL — ABNORMAL HIGH (ref 70–99)
Potassium: 5 mEq/L (ref 3.5–5.1)
Sodium: 131 mEq/L — ABNORMAL LOW (ref 135–145)

## 2012-08-29 NOTE — BH Assessment (Signed)
[facility] Assessment Progress Note      AC requested that I see patient on this date. Apparently, they stated they called ACT Team yesterday, however I did not receive any calls from anyone on second floor, so I am not sure if they contacted [facility] or someone else.   Spoke with the patient, who states he is still suicidal. He reports he is suicidal due to his "situation." When asked what his situation is, he states that he is homeless, living in his car and that he doesn't have anywhere to go. Patient was directed toward placement options and we discussed this at length. He states that he doesn't want to do that because "they take his whole check and only give him, $30 live on and then he can't have his car and he needs his car." So I reframed what he stated, indicating he has chosen to live in his car rather than go to either an assisted living or nursing facility, where he would receive medications, meals, care in a safe environment.  He stated that it wasn't fair they take his whole check. Again, reframed it back that he is making this choice and that his suicidal thoughts are directly related back to the choice he has made. When I questioned him about his specifically are his symptoms as they relate to his depression, he couldn't express any, just that he needs to go "to that place in [place]." I referenced back to [facility], and he stated "yes," he was waiting to be transferred there. I explained to him he would be going to the medical ward there, and typically, people do not spend a lot of time there. He stated he thought he would be there 90 days; I explained to him that is not likely, that essentially he isn't going to the gero-psych or acute psych wards, so, they would briefly stabilize him with medication, ensure he received his dialysis then discharge him with a plan to be placed or for him to go to a homeless shelter. He said that he wasn't aware of that. He started to re-think his options.  He expressed to behavioral health clinician that he is considering placement instead; and wanted to know if he could take his car. I explained to him he could, but he has to follow the facilities rules.  I asked him some routine clinical questions, concerning his appetite-he stated not well, but his lunch tray was almost completely eaten. I asked him about crying spells, he stated frequently when thinking of his situation, though I do not see documentation of this in the chart. He states he is only getting a couple of hours of sleep, but it appears through the documentation he is doing well with sleep since being in the hospital. I do not see any clinical indicators of depression and feel the patient is malingering due to his situation, even though he verbalizes SI- saying he will run his car into an oak tree; I don't feel he is of imminent risk at this time. I would recommend a telepsych evaluation-with this note faxed to the psychiatrist so they have all information as needed for a complete assessment.   Axis I. History of Depression; R/O Malingering at this time Axis II: Deferred Axis III:See current medical issues Axis IV: Significant financial, housing and relational issue, mainly due to a history of making poor decisions Axis V: GAF 40  [address] LCSW, [place]

## 2012-08-29 NOTE — Progress Notes (Signed)
  Joe Vincent RUE:[id number] DOB: [birth year] DOA: 08/16/2012 PCP: [name], MD   Subjective: This patient has no specific complaints apart from being continually suicidal. He was reevaluated by psychiatry  and still felt to be suicidal and requiring inpatient psychiatric care.           Physical Exam: Blood pressure 105/58, pulse 70, temperature 98.3 F (36.8 C), temperature source Oral, resp. rate 20, height 6' (1.829 m), weight 68.8 kg (151 lb 10.8 oz), SpO2 99.00%. He looks systemically well. Heart sounds are present and normal without murmurs. Lung fields are clear. He is alert and orientated.   Investigations:  Recent Results (from the past 240 hour(s))  URINE CULTURE     Status: None   Collection Time    08/19/12  7:57 PM      Result Value Range Status   Specimen Description URINE, CLEAN CATCH   Final   Special Requests NONE   Final   Culture  Setup Time 08/19/2012 21:00   Final   Colony Count 15,000 COLONIES/ML   Final   Culture     Final   Value: Multiple bacterial morphotypes present, none predominant. Suggest appropriate recollection if clinically indicated.   Report Status 08/21/2012 FINAL   Final     Basic Metabolic Panel:  Recent Labs  14/78/29 0447 08/28/12 0631 08/29/12 0604  NA 130* 126* 131*  K 4.5 5.7* 5.0  CL 94* 92* 97  CO2 30 25 27   GLUCOSE 167* 124* 122*  BUN 19 30* 20  CREATININE 5.44* 7.21* 5.67*  CALCIUM 8.4 8.0* 8.4  PHOS 3.5  --   --              Medications: I have reviewed the patient's current medications.  Impression: 1. End-stage renal disease on hemodialysis. 2. Suicidal ideation. He will need inpatient psychiatric care. 3. Diabetic neuropathy. 4. Hypertension, stable. 5. Diabetes, stable. 6. Hyperkalemia, secondary to end-stage renal disease, being controlled by dialysis and Kayexalate, which he intermittently refuses to take.     Plan: 1. Continue with hemodialysis as scheduled. 2. Await disposition  for inpatient psychiatric care for suicidal ideation.  Consultants:  Tele psychiatry-apparently this will be required every 48 hours whilst he is an inpatient medically.   Procedures:  None.   Antibiotics:  None.                   Code Status: Full code.  Family Communication: Discussed plan with patient at bedside.   Disposition Plan: Inpatient psychiatric unit when a bed is found.  Time spent: 10 minutes    LOS: 13 days   Wilson Singer Pager [phone]  08/29/2012, 8:45 AM

## 2012-08-29 NOTE — Progress Notes (Signed)
I appreciate involvement of the act team today. The patient has been confronted with his probable malingering. I do not believe he is actually suicidal based upon report from nursing staff, social work and act team and my own observations. I think the patient may be agreeable to be discharged to a skilled nursing facility. Social work to followup with this .

## 2012-08-29 NOTE — Progress Notes (Signed)
Subjective: Interval History: Denies any difficulty in breathing. Presently complains of some nausea but no vomiting. He also complains of back pain. He states that overall is feeling better. His appetite is still poor.  Objective: Vital signs in last 24 hours: Temp:  [98 F (36.7 C)-98.7 F (37.1 C)] 98.3 F (36.8 C) (07/27 0549) Pulse Rate:  [60-88] 70 (07/27 0549) Resp:  [19-20] 20 (07/27 0549) BP: (92-150)/(57-78) 105/58 mmHg (07/27 0549) SpO2:  [98 %-99 %] 99 % (07/27 0549) Weight:  [68.8 kg (151 lb 10.8 oz)] 68.8 kg (151 lb 10.8 oz) (07/27 0549) Weight change: -1.397 kg (-3 lb 1.3 oz)  Intake/Output from previous day: 07/26 0701 - 07/27 0700 In: 2160 [P.O.:2160] Out: 2284  Intake/Output this shift: Total I/O In: 240 [P.O.:240] Out: -   General appearance: alert, cooperative and no distress Resp: clear to auscultation bilaterally Cardio: regular rate and rhythm, S1, S2 normal, no murmur, click, rub or gallop GI: soft, non-tender; bowel sounds normal; no masses,  no organomegaly Extremities: extremities normal, atraumatic, no cyanosis or edema  Lab Results:  Recent Labs  08/28/12 0631  WBC 4.6  HGB 11.0*  HCT 33.1*  PLT 156   BMET:   Recent Labs  08/28/12 0631 08/29/12 0604  NA 126* 131*  K 5.7* 5.0  CL 92* 97  CO2 25 27  GLUCOSE 124* 122*  BUN 30* 20  CREATININE 7.21* 5.67*  CALCIUM 8.0* 8.4   No results found for this basename: PTH,  in the last 72 hours Iron Studies: No results found for this basename: IRON, TIBC, TRANSFERRIN, FERRITIN,  in the last 72 hours  Studies/Results: No results found.  I have reviewed the patient's current medications.  Assessment/Plan: Problem #1 end-stage renal disease is status post hemodialysis  yesterday , his BUN is 20  andcreatinine 5.67  Problem #2 hyperkalemia last potassium was 5 is corrected. Patient is on 2K baths Problem #3 hypertension his blood pressure is reasonably controlled Problem #4  diabetes Problem #5 suicide ideation Problem #6 metabolic bone disease his calcium and phosphorus is with  in acceptable range. Phosphorus is 3.5. Problem #7 anemia his hemoglobin and hematocrit stable. Plan: Marycl[facility] Labrador will continue with present treatment.  BMET and cbc in am. If his potassium becomes high we'll change his dialysis bath to 1+K/ 2.5 calcium.  LOS: 13 days   Blaise Grieshaber S 08/29/2012,9:26 AM

## 2012-08-29 NOTE — BH Assessment (Signed)
[facility] Assessment Progress Note     Spoke to Dr. Karilyn Cota, gave him my clinical impression; feeling at this point that the patient is malingering due to his social situation. If patient is discharged to a facility, arrangements would need to be made to an appropriate outpatient service provider in the community to continue with medication evaluations concerning psychotropic medications. Counseling would be a value as well, due to the patient having little insight on how his poor decisions have lead him to this current situation and also to help him overcome some of his entitlement issues concerning his care and well being. If he is placed in a nursing facility, he can see psychiatric consults at the facility based program (usually a contract provider).   If telepsych is completed once again, I would include the recent clinical notes; also get a recommendation on appropriate psychotropic medications, with dosage.   Shon Baton, LCSW, LCASA

## 2012-08-30 LAB — BASIC METABOLIC PANEL
CO2: 27 mEq/L (ref 19–32)
Chloride: 100 mEq/L (ref 96–112)
Sodium: 134 mEq/L — ABNORMAL LOW (ref 135–145)

## 2012-08-30 LAB — GLUCOSE, CAPILLARY
Glucose-Capillary: 125 mg/dL — ABNORMAL HIGH (ref 70–99)
Glucose-Capillary: 106 mg/dL — ABNORMAL HIGH (ref 70–99)
Glucose-Capillary: 209 mg/dL — ABNORMAL HIGH (ref 70–99)

## 2012-08-30 NOTE — Progress Notes (Signed)
Hypoglycemic Event  CBG: 63  Treatment: 15 GM carbohydrate snack  Symptoms: None  Follow-up CBG: [id number] CBG Result:84  Possible Reasons for Event: Unknown  Comments/MD notified:Dr. Rito Ehrlich notified    Joe Vincent  Remember to initiate Hypoglycemia Order Set & complete

## 2012-08-30 NOTE — Progress Notes (Signed)
Patient stable. For dialysis today. He voices no complaints but when asked if he is suicidal the patient states "yes". Waiting for followup tele-psychiatry consultation. Blood pressure and diabetes under excellent control.

## 2012-08-30 NOTE — Clinical Social Work Note (Signed)
CSW met with pt this morning to follow up after ACT team visit yesterday. CSW discussed ALF again with pt and he states he has thought about it and is not interested because he plans to have his sister help him get an apartment. Pt is up sitting in chair.  When asked if he is feeling suicidal, pt responded, "Well yeah" after a very long pause. He indicates no plan currently as he is "trying to work things out." Pt to have another telepsych today. [facility] called this morning to confirm pt still needs inpatient bed.  Derenda Fennel, [place] [phone]

## 2012-08-30 NOTE — Progress Notes (Signed)
Subjective: Interval History: Denies any difficulty in breathing. Presently complains of some nausea but no vomiting. He states that overall is feeling better. His appetite is still poor.  Objective: Vital signs in last 24 hours: Temp:  [97.8 F (36.6 C)-98.2 F (36.8 C)] 97.9 F (36.6 C) (07/28 0510) Pulse Rate:  [66-77] 69 (07/28 0510) Resp:  [20] 20 (07/28 0510) BP: (109-136)/(63-72) 112/68 mmHg (07/28 0510) SpO2:  [97 %-100 %] 97 % (07/28 0510) Weight:  [68.4 kg (150 lb 12.7 oz)] 68.4 kg (150 lb 12.7 oz) (07/28 0510) Weight change: 4.896 kg (10 lb 12.7 oz)  Intake/Output from previous day: 07/27 0701 - 07/28 0700 In: 1440 [P.O.:1440] Out: -  Intake/Output this shift:    General appearance: alert, cooperative and no distress Resp: clear to auscultation bilaterally Cardio: regular rate and rhythm, S1, S2 normal, no murmur, click, rub or gallop GI: soft, non-tender; bowel sounds normal; no masses,  no organomegaly Extremities: extremities normal, atraumatic, no cyanosis or edema  Lab Results:  Recent Labs  08/28/12 0631  WBC 4.6  HGB 11.0*  HCT 33.1*  PLT 156   BMET:   Recent Labs  08/29/12 0604 08/30/12 0501  NA 131* 134*  K 5.0 5.5*  CL 97 100  CO2 27 27  GLUCOSE 122* 68*  BUN 20 33*  CREATININE 5.67* 7.68*  CALCIUM 8.4 8.6   No results found for this basename: PTH,  in the last 72 hours Iron Studies: No results found for this basename: IRON, TIBC, TRANSFERRIN, FERRITIN,  in the last 72 hours  Studies/Results: No results found.  I have reviewed the patient's current medications.  Assessment/Plan: Problem #1 end-stage renal disease is status post hemodialysis  yesterday , his BUN is 33  andcreatinine 7.68  Problem #2 hyperkalemia last potassium was 5.5 increasing. Patient is on 2K baths Problem #3 hypertension his blood pressure is reasonably controlled Problem #4 diabetes Problem #5 suicide ideation Problem #6 metabolic bone disease his calcium  and phosphorus is with  in acceptable range. Phosphorus is 3.5. Problem #7 anemia his hemoglobin and hematocrit stable. Plan: We'll consider dialyzing patient today and use 1k+bath Check BMET in am.  LOS: 14 days   Carolann Brazell S 08/30/2012,8:42 AM

## 2012-08-30 NOTE — Progress Notes (Signed)
UR chart review completed.  

## 2012-08-30 NOTE — Progress Notes (Signed)
Psych evaluation completed, Dr. Rito Ehrlich notified of results.

## 2012-08-31 DIAGNOSIS — Z590 Homelessness: Secondary | ICD-10-CM

## 2012-08-31 DIAGNOSIS — F17200 Nicotine dependence, unspecified, uncomplicated: Secondary | ICD-10-CM

## 2012-08-31 LAB — GLUCOSE, CAPILLARY
Glucose-Capillary: 139 mg/dL — ABNORMAL HIGH (ref 70–99)
Glucose-Capillary: 196 mg/dL — ABNORMAL HIGH (ref 70–99)

## 2012-08-31 LAB — BASIC METABOLIC PANEL
BUN: 18 mg/dL (ref 6–23)
GFR calc Af Amer: 12 mL/min — ABNORMAL LOW (ref >90)
GFR calc non Af Amer: 10 mL/min — ABNORMAL LOW (ref >90)
Potassium: 4.6 mEq/L (ref 3.5–5.1)

## 2012-08-31 MED ORDER — ALTEPLASE 2 MG IJ SOLR: 2.0000 mg | Freq: Once | INTRAMUSCULAR | Status: AC | PRN

## 2012-08-31 MED ORDER — SODIUM CHLORIDE 0.9 % IV SOLN: 100.0000 mL | INTRAVENOUS | Status: DC | PRN

## 2012-08-31 NOTE — Progress Notes (Signed)
[facility] called for report. Also called report to receiving nurse at [facility].

## 2012-08-31 NOTE — Progress Notes (Signed)
[facility] PROGRESS NOTE  Joe Vincent EAV:[id number] DOB: July 04, 1960 DOA: 08/16/2012 PCP: [name], MD  Interim Summary: 52 year old white male with past medical history including end-stage renal disease on dialysis plus diabetes, hypertension and CAD who presented to the emergency room on 7/17 chief complaint of shortness of breath. Patient apparently had been discharged from or had hospital the day prior. Allegedly he stated he was to be transferred to behavioral health due to 2 persistent suicidal ideation however there was not a bed available so we discharged him and allegedly they instructed him to come to the emergency room in [place].  He was admitted to the hospitalist service and found to be volume overloaded which was the cause of the shortness of breath. After receiving dialysis, his symptoms resolved. Since that time, he has been awaiting [facility] He has received daily consultation by Tele-psychiatry, who each day stating that he still felt to be suicidal. Decision was made to move patient to [facility], so that he could get continue dialysis and be evaluated by psychiatry in person to help start treatment.   Assessment/Plan: Principal Problem:   ESRD needing dialysis: Continued on dialysis. Last dialysis session was Monday 7/28. Active Problems:   Hypertension: Blood pressure recently controlled   Gastritis   CAD (coronary artery disease): Stable   CKD (chronic kidney disease) stage 3, GFR 30-59 ml/min   Nausea and vomiting   Hyperkalemia: Potassium today was 4.6.   Hyponatremia   DM (diabetes mellitus): CBGs and remained well-controlled   Tobacco abuse: He is on a nicotine patch.   Suicide ideation: Corporate investment banker. He should be transferred to [facility] where he will be evaluated by psychiatry directly and to start treatment   Code Status: Full code Family Communication: No family present Disposition  Plan: Patient will transfer to [facility] for continued dialysis plus inpatient psychiatry consult   Consultants:  Nephrology  Tele-psychiatry  Act team  Procedures:  Dialysis 3 times a week  Antibiotics:  None  HPI/Subjective: Patient states she's okay. Tired.  Objective: Filed Vitals:   08/30/12 1512 08/30/12 2100 08/31/12 0500 08/31/12 1336  BP: 107/67 102/63 93/52 112/69  Pulse: 62 77 65 82  Temp:  98.3 F (36.8 C) 97.9 F (36.6 C) 97.8 F (36.6 C)  TempSrc:  Oral  Oral  Resp:  18 18 18   Height:      Weight: 63.3 kg (139 lb 8.8 oz)  64.501 kg (142 lb 3.2 oz)   SpO2:  100% 98% 100%    Intake/Output Summary (Last 24 hours) at 08/31/12 1435 Last data filed at 08/31/12 0000  Gross per 24 hour  Intake    240 ml  Output   3250 ml  Net  -3010 ml   Filed Weights   08/30/12 0510 08/30/12 1512 08/31/12 0500  Weight: 68.4 kg (150 lb 12.7 oz) 63.3 kg (139 lb 8.8 oz) 64.501 kg (142 lb 3.2 oz)    Exam:   General:  Alert and oriented x3, no acute distress  Cardiovascular: Regular rate and rhythm, S1-S2  Respiratory: Clear to auscultation bilaterally  Abdomen: Soft, nontender, nondistended, positive bowel sounds  Musculoskeletal: No clubbing cyanosis or edema   Data Reviewed: Basic Metabolic Panel:  Recent Labs Lab 08/27/12 0447 08/28/12 0631 08/29/12 0604 08/30/12 0501 08/31/12 0452  NA 130* 126* 131* 134* 133*  K 4.5 5.7* 5.0 5.5* 4.6  CL 94* 92* 97 100 95*  CO2 30  25 27 27  32  GLUCOSE 167* 124* 122* 68* 101*  BUN 19 30* 20 33* 18  CREATININE 5.44* 7.21* 5.67* 7.68* 5.77*  CALCIUM 8.4 8.0* 8.4 8.6 9.0  PHOS 3.5  --   --  3.2  --    Liver Function Tests:  Recent Labs Lab 08/28/12 0631  AST 46*  ALT 63*  ALKPHOS 151*  BILITOT 0.2*  PROT 6.5  ALBUMIN 2.9*   CBC:  Recent Labs Lab 08/28/12 0631  WBC 4.6  HGB 11.0*  HCT 33.1*  MCV 94.3  PLT 156   BNP (last 3 results)  Recent Labs  08/17/12 0746  PROBNP 3324.0*    CBG:  Recent Labs Lab 08/30/12 1123 08/30/12 1600 08/30/12 2038 08/31/12 0721 08/31/12 1138  GLUCAP 125* 106* 209* 139* 153*      Studies: No results found.  Scheduled Meds: . b complex-vitamin c-folic acid  1 tablet Oral Daily  . citalopram  20 mg Oral Daily  . diazepam  10 mg Oral BID  . heparin  5,000 Units Subcutaneous Q8H  . insulin aspart  0-15 Units Subcutaneous TID WC  . insulin glargine  15 Units Subcutaneous QHS  . nicotine  21 mg Transdermal Daily  . pantoprazole  40 mg Oral Daily  . sodium chloride  3 mL Intravenous Q12H  . sodium chloride  3 mL Intravenous Q12H  . sucralfate  1 g Oral TID WC & HS   Continuous Infusions:   Principal Problem:   ESRD needing dialysis Active Problems:   Hypertension   Gastritis   CAD (coronary artery disease)   CKD (chronic kidney disease) stage 3, GFR 30-59 ml/min   Nausea and vomiting   Hyperkalemia   Hyponatremia   DM (diabetes mellitus)   Tobacco abuse   Suicide ideation    Time spent: 30 min    Hollice Espy  [facility] Pager [phone]. If 7PM-7AM, please contact night-coverage at [email or web address], password [facility] 08/31/2012, 2:35 PM  LOS: 15 days

## 2012-08-31 NOTE — Progress Notes (Signed)
Transferred to [facility] via [facility]. VSS. All transfer papers completed and sent with patient.

## 2012-08-31 NOTE — Progress Notes (Signed)
Subjective: Interval History: Denies any difficulty in breathing. Presently complains of abdominal pain. He states that overall is feeling better. His appetite is still poor.  Objective: Vital signs in last 24 hours: Temp:  [97.9 F (36.6 C)-98.4 F (36.9 C)] 97.9 F (36.6 C) (07/29 0500) Pulse Rate:  [58-77] 65 (07/29 0500) Resp:  [18-20] 18 (07/29 0500) BP: (86-135)/(47-70) 93/52 mmHg (07/29 0500) SpO2:  [97 %-100 %] 98 % (07/29 0500) Weight:  [63.3 kg (139 lb 8.8 oz)-64.501 kg (142 lb 3.2 oz)] 64.501 kg (142 lb 3.2 oz) (07/29 0500) Weight change: -5.1 kg (-11 lb 3.9 oz)  Intake/Output from previous day: 07/28 0701 - 07/29 0700 In: 360 [P.O.:360] Out: 3250  Intake/Output this shift:    General appearance: alert, cooperative and no distress Resp: clear to auscultation bilaterally Cardio: regular rate and rhythm, S1, S2 normal, no murmur, click, rub or gallop GI: soft, non-tender; bowel sounds normal; no masses,  no organomegaly Extremities: extremities normal, atraumatic, no cyanosis or edema  Lab Results: No results found for this basename: WBC, HGB, HCT, PLT,  in the last 72 hours BMET:   Recent Labs  08/30/12 0501 08/31/12 0452  NA 134* 133*  K 5.5* 4.6  CL 100 95*  CO2 27 32  GLUCOSE 68* 101*  BUN 33* 18  CREATININE 7.68* 5.77*  CALCIUM 8.6 9.0   No results found for this basename: PTH,  in the last 72 hours Iron Studies: No results found for this basename: IRON, TIBC, TRANSFERRIN, FERRITIN,  in the last 72 hours  Studies/Results: No results found.  I have reviewed the patient's current medications.  Assessment/Plan: Problem #1 end-stage renal disease is status post hemodialysis  yesterday , his BUN is 18  And creatinine is 5.77 Problem #2 hyperkalemia last potassium was 4.6  improving. Patient is on 1K baths Problem #3 hypertension his blood pressure is reasonably controlled Problem #4 diabetes Problem #5 suicide ideation Problem #6 metabolic bone  disease his calcium and phosphorus is with  in acceptable range. Phosphorus is 3.5. Problem #7 anemia his hemoglobin and hematocrit stable. Plan:Check BMET in am.  LOS: 15 days   Jaysin Gayler S 08/31/2012,7:36 AM

## 2012-08-31 NOTE — Progress Notes (Signed)
Nutrition Follow-up   INTERVENTION: Continue with current nutrition care  NUTRITION DIAGNOSIS: Inadequate oral intake; resolved   Goal: Pt to meet >/= 90% of their estimated nutrition needs; met  Monitor:  Po intake, labs, I/O's and wt trends  52 y.o. male  Admitting Dx: ESRD needing dialysis  ASSESSMENT: Pt is waiting placement. Po meal records show overall improved appetite and intake. He reports occasional nausea but no vomiting.  Height: Ht Readings from Last 1 Encounters:  08/17/12 6' (1.829 m)    Weight Status: Wt Readings from Last 1 Encounters:  08/31/12 142 lb 3.2 oz (64.501 kg)  Admit weight-128# (58 kg)   Ideal Body Weight: 178# (80.9 kg)  % Ideal Body Weight: 80%  Wt Readings from Last 10 Encounters:  08/31/12 142 lb 3.2 oz (64.501 kg)  08/28/11 175 lb (79.379 kg)  05/26/11 167 lb (75.751 kg)  05/26/11 167 lb (75.751 kg)  05/26/11 167 lb (75.751 kg)    Usual Body Weight: 165-175#  % Usual Body Weight: 86%  BMI:  Body mass index is 19.28 kg/(m^2). normal range  Estimated Nutritional Needs: Kcal: 1610-9604 Protein: 80-90 gr Fluid: 1000 ml plus output  Skin: No issues noted  Diet Order: Renal   EDUCATION NEEDS: -Education needs addressed   Intake/Output Summary (Last 24 hours) at 08/31/12 1302 Last data filed at 08/31/12 0000  Gross per 24 hour  Intake    240 ml  Output   3250 ml  Net  -3010 ml  Net since admission (08/17/12)  +5.5 L  Last BM: 08/31/12  Labs:   Recent Labs Lab 08/27/12 0447  08/29/12 0604 08/30/12 0501 08/31/12 0452  NA 130*  < > 131* 134* 133*  K 4.5  < > 5.0 5.5* 4.6  CL 94*  < > 97 100 95*  CO2 30  < > 27 27 32  BUN 19  < > 20 33* 18  CREATININE 5.44*  < > 5.67* 7.68* 5.77*  CALCIUM 8.4  < > 8.4 8.6 9.0  PHOS 3.5  --   --  3.2  --   GLUCOSE 167*  < > 122* 68* 101*  < > = values in this interval not displayed.  CBG (last 3)   Recent Labs  08/30/12 2038 08/31/12 0721 08/31/12 1138  GLUCAP 209*  139* 153*    Scheduled Meds: . b complex-vitamin c-folic acid  1 tablet Oral Daily  . citalopram  20 mg Oral Daily  . diazepam  10 mg Oral BID  . heparin  5,000 Units Subcutaneous Q8H  . insulin aspart  0-15 Units Subcutaneous TID WC  . insulin glargine  15 Units Subcutaneous QHS  . nicotine  21 mg Transdermal Daily  . pantoprazole  40 mg Oral Daily  . sodium chloride  3 mL Intravenous Q12H  . sodium chloride  3 mL Intravenous Q12H  . sucralfate  1 g Oral TID WC & HS    Continuous Infusions:   Past Medical History  Diagnosis Date  . Diabetes mellitus   . Hypertension   . GERD (gastroesophageal reflux disease)   . Retinopathy   . Peripheral neuropathy   . Chronic kidney disease   . Chest pain     a. 05/2011 Cath: nonobs dzs.  . Myocardial infarction 2000  . Depression   . Liver disease   . Cancer     Colon    Past Surgical History  Procedure Laterality Date  . Colostomy  Ileostomy  colon cancer  . Amputation      left pinky toe  . Esophagogastroduodenoscopy  05/23/2011    Procedure: ESOPHAGOGASTRODUODENOSCOPY (EGD);  Surgeon: Theda Belfast, MD;  Location: [facility] ENDOSCOPY;  Service: Endoscopy;  Laterality: N/A;  . Colon surgery  2009  . Cardiac catheterization  2012  . Eye surgery      Royann Shivers MS,RD,LDN,CSG Office: #[phone] Pager: [phone]

## 2012-08-31 NOTE — Clinical Social Work Note (Signed)
IVC paperwork renewed. Pt to transfer to [facility] today. CSW will give report to oncoming Child psychotherapist.  Derenda Fennel, [place] [phone]

## 2012-08-31 NOTE — Clinical Social Work Note (Signed)
CSW reviewed telepsych which continues to recommend inpatient treatment. Pt is on wait list at [facility]. CSW confirmed with pt that he has Medicaid and receives social security check following conversation with [facility] yesterday. Will continue to follow as pt awaits bed.   Derenda Fennel, [place] [phone]

## 2012-09-01 LAB — GLUCOSE, CAPILLARY
Glucose-Capillary: 121 mg/dL — ABNORMAL HIGH (ref 70–99)
Glucose-Capillary: 92 mg/dL (ref 70–99)
Glucose-Capillary: 153 mg/dL — ABNORMAL HIGH (ref 70–99)

## 2012-09-01 MED ORDER — DOXERCALCIFEROL 4 MCG/2ML IV SOLN
1.0000 ug | INTRAVENOUS | Status: DC
  Filled 2012-09-01 (×2): qty 2

## 2012-09-01 NOTE — Clinical Social Work Psych Note (Signed)
Psych CSW was given report from CSW at [facility] where pt transferred from.  Per Jeani Hawking CSW, pt is IVC'd (dated 08/31/2012).    Psych CSW contacted [facility] and confirmed with Ala Dach that pt was on the consult list for today to be seen by psychiatry.  This was confirmed.  Psych CSW continuing to follow.  Vickii Penna, LCSWA [phone]  Clinical Social Work

## 2012-09-01 NOTE — Progress Notes (Signed)
Nursing Admission Note  Pt arrived to 6E12 via stretcher from [facility] from [facility]. A&Ox4. Suicide sitter at bedside - pt continues to maintain that he has suicidal ideations at this time. Pt otherwise stable and without distress. Pt has several dried scabs and ecchymotic areas on bilat upper extremities. Ted hose in place to BLE. RUQ ileostomy intact. Pt oriented to unit and surroundings. Call bell within reach. Will monitor. C.Anselmo Reihl, RN.

## 2012-09-01 NOTE — Progress Notes (Signed)
[facility] PROGRESS NOTE  Joe Vincent ZOX:[id number] DOB: 05/17/1960 DOA: 08/16/2012 PCP: [name], MD  52 year old white male with past medical history of end-stage renal disease on dialysis ( TTS) --inconsistent), DM,, hypertension and CAD who presented to [facility]  emergency room on 7/17 with shortness of breath. Patient apparently had been discharged from or had hospital the day prior. On admission he reported he was admitted to [facility] hospital recently for suicidal ideation and that he  was to be transferred to behavioral health . He has been depressed and feels rejected from his family and friends and feels he cannot go back to living in his car. He was admitted to the hospitalist service and found to be volume overloaded which was the cause of the shortness of breath. After receiving dialysis, his symptoms resolved. Since that time, he has been awaiting transfer to a psychiatric facility . He has received daily consultation by Tele-psychiatry, who each day stating that he still felt to be suicidal. In order to provide better assessment patient was transferred  to [facility].  Assessment/Plan: Suicidal ideation Seen by psych. Official note pending. As per my conversation with the consult, patient doesnot seems suicidal at this time and can d/c sitter but he has severe psychosocial issues and discharging him from hospital would may trigger his intention to harm himself. He recommends for inpatient behavior health fr short term vs group home where he can be stable. SW and CM consult placed. D/c sitter  continue celexa  ESRD   on HD . He seems to be irregular with his scheduled and shows up for HD whenever he feels like. Renal following  DM  continue lantus and SSI  HTN  BP stable. continue amlodipine  CAD  stable   Tobacco abuse  counseled on cessation    Code Status: full code Family Communication: none Disposition Plan pending.needs  placement   Consultants:  Psych  Procedures:  none  Antibiotics:  none  HPI/Subjective: Still feels suicidal on my eval this morning  Objective: Filed Vitals:   08/31/12 2130 09/01/12 0608 09/01/12 1000 09/01/12 1357  BP: 142/85 106/69 115/66 94/58  Pulse: 73 60 56 70  Temp: 97.7 F (36.5 C) 97.3 F (36.3 C) 98.2 F (36.8 C) 98.2 F (36.8 C)  TempSrc: Oral Oral Oral Oral  Resp: 18 16 18 18   Height:      Weight: 60.7 kg (133 lb 13.1 oz)     SpO2: 100% 98% 98% 96%    Intake/Output Summary (Last 24 hours) at 09/01/12 1645 Last data filed at 09/01/12 0905  Gross per 24 hour  Intake    240 ml  Output      0 ml  Net    240 ml   Filed Weights   08/30/12 1512 08/31/12 0500 08/31/12 2130  Weight: 63.3 kg (139 lb 8.8 oz) 64.501 kg (142 lb 3.2 oz) 60.7 kg (133 lb 13.1 oz)    Exam:   General:  Middle aged male in nAD  HEENT: no pallor , moist mucosa  Chest: clear b/l, no added sounds  CVS: NS1&S2, no murmurs  Abd: soft, NT, ND, BS+  Ext: warm, rt AV fistula  CNS: AAOX3  Data Reviewed: Basic Metabolic Panel:  Recent Labs Lab 08/27/12 0447 08/28/12 0631 08/29/12 0604 08/30/12 0501 08/31/12 0452  NA 130* 126* 131* 134* 133*  K 4.5 5.7* 5.0 5.5* 4.6  CL 94* 92* 97 100 95*  CO2 30 25 27 27  32  GLUCOSE 167* 124* 122* 68* 101*  BUN 19 30* 20 33* 18  CREATININE 5.44* 7.21* 5.67* 7.68* 5.77*  CALCIUM 8.4 8.0* 8.4 8.6 9.0  PHOS 3.5  --   --  3.2  --    Liver Function Tests:  Recent Labs Lab 08/28/12 0631  AST 46*  ALT 63*  ALKPHOS 151*  BILITOT 0.2*  PROT 6.5  ALBUMIN 2.9*   No results found for this basename: LIPASE, AMYLASE,  in the last 168 hours No results found for this basename: AMMONIA,  in the last 168 hours CBC:  Recent Labs Lab 08/28/12 0631  WBC 4.6  HGB 11.0*  HCT 33.1*  MCV 94.3  PLT 156   Cardiac Enzymes: No results found for this basename: CKTOTAL, CKMB, CKMBINDEX, TROPONINI,  in the last 168 hours BNP (last 3  results)  Recent Labs  08/17/12 0746  PROBNP 3324.0*   CBG:  Recent Labs Lab 08/31/12 1138 08/31/12 1709 08/31/12 2135 09/01/12 0745 09/01/12 1147  GLUCAP 153* 196* 123* 121* 92    No results found for this or any previous visit (from the past 240 hour(s)).   Studies: No results found.  Scheduled Meds: . b complex-vitamin c-folic acid  1 tablet Oral Daily  . citalopram  20 mg Oral Daily  . diazepam  10 mg Oral BID  . [START ON 09/02/2012] doxercalciferol  1 mcg Intravenous Q T,Th,Sa-HD  . heparin  5,000 Units Subcutaneous Q8H  . insulin aspart  0-15 Units Subcutaneous TID WC  . insulin glargine  15 Units Subcutaneous QHS  . nicotine  21 mg Transdermal Daily  . pantoprazole  40 mg Oral Daily  . sucralfate  1 g Oral TID WC & HS   Continuous Infusions:    Time spent: 25 minutes    Mars Scheaffer  [facility] Pager [phone] If 7PM-7AM, please contact night-coverage at [email or web address], password [facility] 09/01/2012, 4:45 PM  LOS: 16 days

## 2012-09-01 NOTE — Consult Note (Signed)
Requesting Physician: Dr. Gonzella Vincent Reason for Consult:  Provision of hemodialysis in pt with ESRD, management of anemia and secondary HPT HPI: The patient is a 52 y.o. year-old WM with DM, HTN, CAD, ESRD on HD TTS at [facility] unit under the care of Dr. Fausto Vincent. He is transferred from [facility] for formal psychiatric evaluation for suicidal thoughts.  He has been hospitalized at [facility] and at [facility] in the past several weeks, and was reportedly waiting for a psychiatric bed at [facility] but was transferred here to that face to face psychiatric intervention could begin.  Patient gets usual HD TIW at [facility]   On review of records and notes from Dr. Fausto Vincent, he is extremely non-compliant and comes to the unit irregularly for HD.  Has had issues with hyperkalemia.    His outpt prescription is TTS, 4 hours, 2K2.5Ca, EDW 61.6 kg, 15 gu sharps, right upper arm AVF, 2000 units heparin, Hectorol 1 mcg TIW, no EPO.   His last dialysis according to the records was on 7/28 (7/18. 7/21, 7/23, 7/25, 7/28) so has been off schedule during his recent hospitalization at [facility].  Past Medical History:  Past Medical History  Diagnosis Date  . Diabetes mellitus   . Hypertension   . GERD (gastroesophageal reflux disease)   . Retinopathy   . Peripheral neuropathy   . Chronic kidney disease   . Chest pain     a. 05/2011 Cath: nonobs dzs.  . Myocardial infarction 2000  . Depression   . Liver disease   . Cancer     Colon    Past Surgical History:  Past Surgical History  Procedure Laterality Date  . Colostomy  Ileostomy    colon cancer  . Amputation      left pinky toe  . Esophagogastroduodenoscopy  05/23/2011    Procedure: ESOPHAGOGASTRODUODENOSCOPY (EGD);  Surgeon: Joe Belfast, MD;  Location: [facility] ENDOSCOPY;  Service: Endoscopy;  Laterality: N/A;  . Colon surgery  2009  . Cardiac catheterization  2012  . Eye surgery      Family History:  Family History  Problem Relation Age of Onset   . Cancer Mother   . Heart disease Father   . Cancer Father    Social History:  reports that he has been smoking Cigarettes and Cigars.  He has a 15 pack-year smoking history. He has never used smokeless tobacco. He reports that he does not drink alcohol or use illicit drugs.  Allergies:  Allergies  Allergen Reactions  . Codeine Swelling    Home medications: Prior to Admission medications   Medication Sig Start Date End Date Taking? Authorizing Provider  amLODipine (NORVASC) 10 MG tablet Take 10 mg by mouth daily.   Yes Historical Provider, MD  insulin aspart (NOVOLOG) 100 UNIT/ML injection Inject 4-12 Units into the skin 3 (three) times daily as needed for high blood sugar (Uses a sliding scale at home).   Yes Historical Provider, MD  insulin glargine (LANTUS) 100 UNIT/ML injection Inject 15 Units into the skin at bedtime.    Yes Historical Provider, MD  multivitamin (RENA-VIT) TABS tablet Take 1 tablet by mouth daily.   Yes Historical Provider, MD  diazepam (VALIUM) 10 MG tablet Take 1 tablet (10 mg total) by mouth 2 (two) times daily. 08/19/12   Joe Blinks, MD  HYDROmorphone (DILAUDID) 4 MG tablet Take 1 tablet (4 mg total) by mouth 2 (two) times daily. 08/19/12   Joe Blinks, MD  pantoprazole (PROTONIX) 40 MG  tablet Take 1 tablet (40 mg total) by mouth daily. 08/19/12   Joe Blinks, MD  sucralfate (CARAFATE) 1 G tablet Take 1 tablet (1 g total) by mouth 4 (four) times daily -  with meals and at bedtime. 08/19/12   Joe Blinks, MD    Inpatient medications: . b complex-vitamin c-folic acid  1 tablet Oral Daily  . citalopram  20 mg Oral Daily  . diazepam  10 mg Oral BID  . heparin  5,000 Units Subcutaneous Q8H  . insulin aspart  0-15 Units Subcutaneous TID WC  . insulin glargine  15 Units Subcutaneous QHS  . nicotine  21 mg Transdermal Daily  . pantoprazole  40 mg Oral Daily  . sodium chloride  3 mL Intravenous Q12H  . sodium chloride  3 mL Intravenous Q12H  .  sucralfate  1 g Oral TID WC & HS    Review of Systems Gen:  Denies headache, fever, chills, sweats.  No weight loss. HEENT:  No visual change, sore throat, difficulty swallowing. Resp:  No difficulty breathing, DOE.  No cough or hemoptysis. Cardiac:  No chest pain, orthopnea, PND.  Denies edema. GI:   Denies abdominal pain.   No nausea, vomiting, diarrhea.  No constipation. GU:  Minimal UOP     MS:  Denies joint pain or swelling.   Derm:  Denies skin rash or itching.  No chronic skin conditions.  Neuro:   Denies focal weakness, memory problems, hx stroke or TIA.   Psych:  Has reported wish to run car into a tree or harm self   Physical Exam:  Blood pressure 115/66, pulse 56, temperature 98.2 F (36.8 C), temperature source Oral, resp. rate 18, height 6' (1.829 m), weight 60.7 kg (133 lb 13.1 oz), SpO2 98.00%.  Gen: Tall thin WM NAD Reading Skin: Tattoos no rashes Neck: no JVD, no bruits or LAN Chest: Clear to A Heart: S1S2 no S3 or rub No murmur Abdomen: soft, non tender Ext: no edema  Right upper arm AVF well developed with good bruit and thrill Neuro: alert, Ox3, no focal deficit Heme/Lymph: no bruising or LAN  Labs: Basic Metabolic Panel:  Recent Labs Lab 08/26/12 0534 08/27/12 0447 08/28/12 0631 08/29/12 0604 08/30/12 0501 08/31/12 0452  NA 128* 130* 126* 131* 134* 133*  K 6.0* 4.5 5.7* 5.0 5.5* 4.6  CL 93* 94* 92* 97 100 95*  CO2 24 30 25 27 27  32  GLUCOSE 95 167* 124* 122* 68* 101*  BUN 43* 19 30* 20 33* 18  CREATININE 8.96* 5.44* 7.21* 5.67* 7.68* 5.77*  CALCIUM 8.7 8.4 8.0* 8.4 8.6 9.0  PHOS  --  3.5  --   --  3.2  --    Liver Function Tests:  Recent Labs Lab 08/28/12 0631  AST 46*  ALT 63*  ALKPHOS 151*  BILITOT 0.2*  PROT 6.5  ALBUMIN 2.9*  CBC:  Recent Labs Lab 08/28/12 0631  WBC 4.6  HGB 11.0*  HCT 33.1*  MCV 94.3  PLT 156   Recent Labs Lab 08/31/12 1138 08/31/12 1709 08/31/12 2135 09/01/12 0745 09/01/12 1147  GLUCAP 153* 196*  123* 121* 92   Impression/Plan 52 yo WM with DM, HTN, ESRD on TTS HD ([facility]), depression with suicidal thoughts, transferred from [facility] for formal face to face psychiatric evaluation.  ESRD Outpt TTS Has been getting MWF at [facility] Will get back on regular TTS schedule starting tomorrow  Anemia Hb 11 on no ESA's  CKD-MBD Ca/phos normal  on no binders at present No PTH data but is on TIW hectorol  Psych issues with reported suicidal thoughts For formal evaluation  Camille Bal,  MD [facility] [phone] pager 09/01/2012, 1:42 PM

## 2012-09-01 NOTE — Consult Note (Signed)
Reason for Consult: depression and suicidal Referring Physician: [name] is an 52 y.o. male.  HPI: Patient is seen and chart reviewed. Patient reportedly has a depression and suicidal attempt and [facility] [place] [facility]. Patient has been waiting for 2 weeks to get to acute psychiatric hospitalization in [place] without success. Patient was referred to the [facility] for the dialysis as he has a Chronic Renal Failure. He has been reportedly suffering with a depression over a few months since he become homeless, unable to leave it to his brother in the OR his friends. Patient was this case manager who was not able to have him for the placement. Patient has been living in his car and has been receiving disability social service. Patient stated he wanted to end his life by taking overdose on his medication. Patient cannot contract for safety.  Mental Status Examination: Patient appeared as per his stated age,  and maintaining good eye contact. Patient has depressed and anxious mood and his affect was constricted. He has normal rate, rhythm, and volume of speech. His thought process is linear and goal directed. Patient has  active suicidal, with plan of overdose, denied homicidal ideations, intentions or plans. Patient has no evidence of auditory or visual hallucinations, delusions, and paranoia. Patient has poor insight judgment and impulse control.  Past Medical History  Diagnosis Date  . Diabetes mellitus   . Hypertension   . GERD (gastroesophageal reflux disease)   . Retinopathy   . Peripheral neuropathy   . Chronic kidney disease   . Chest pain     a. 05/2011 Cath: nonobs dzs.  . Myocardial infarction 2000  . Depression   . Liver disease   . Cancer     Colon    Past Surgical History  Procedure Laterality Date  . Colostomy  Ileostomy    colon cancer  . Amputation      left pinky toe  .  Esophagogastroduodenoscopy  05/23/2011    Procedure: ESOPHAGOGASTRODUODENOSCOPY (EGD);  Surgeon: Theda Belfast, MD;  Location: [facility] ENDOSCOPY;  Service: Endoscopy;  Laterality: N/A;  . Colon surgery  2009  . Cardiac catheterization  2012  . Eye surgery      Family History  Problem Relation Age of Onset  . Cancer Mother   . Heart disease Father   . Cancer Father     Social History:  reports that he has been smoking Cigarettes and Cigars.  He has a 15 pack-year smoking history. He has never used smokeless tobacco. He reports that he does not drink alcohol or use illicit drugs.  Allergies:  Allergies  Allergen Reactions  . Codeine Swelling    Medications: I have reviewed the patient's current medications.  Results for orders placed during the hospital encounter of 08/16/12 (from the past 48 hour(s))  GLUCOSE, CAPILLARY     Status: Abnormal   Collection Time    08/30/12  4:00 PM      Result Value Range   Glucose-Capillary 106 (*) 70 - 99 mg/dL   Comment 1 Notify RN    GLUCOSE, CAPILLARY     Status: Abnormal   Collection Time    08/30/12  8:38 PM      Result Value Range   Glucose-Capillary 209 (*) 70 - 99 mg/dL   Comment 1 Notify RN    BASIC METABOLIC PANEL     Status: Abnormal  Collection Time    08/31/12  4:52 AM      Result Value Range   Sodium 133 (*) 135 - 145 mEq/L   Potassium 4.6  3.5 - 5.1 mEq/L   Chloride 95 (*) 96 - 112 mEq/L   CO2 32  19 - 32 mEq/L   Glucose, Bld 101 (*) 70 - 99 mg/dL   BUN 18  6 - 23 mg/dL   Comment: DELTA CHECK NOTED   Creatinine, Ser 5.77 (*) 0.50 - 1.35 mg/dL   Calcium 9.0  8.4 - 16.1 mg/dL   GFR calc non Af Amer 10 (*) >90 mL/min   GFR calc Af Amer 12 (*) >90 mL/min   Comment:            The eGFR has been calculated     using the CKD EPI equation.     This calculation has not been     validated in all clinical     situations.     eGFR's persistently     <90 mL/min signify     possible Chronic Kidney Disease.  GLUCOSE, CAPILLARY      Status: Abnormal   Collection Time    08/31/12  7:21 AM      Result Value Range   Glucose-Capillary 139 (*) 70 - 99 mg/dL   Comment 1 Notify RN    GLUCOSE, CAPILLARY     Status: Abnormal   Collection Time    08/31/12 11:38 AM      Result Value Range   Glucose-Capillary 153 (*) 70 - 99 mg/dL   Comment 1 Notify RN    GLUCOSE, CAPILLARY     Status: Abnormal   Collection Time    08/31/12  5:09 PM      Result Value Range   Glucose-Capillary 196 (*) 70 - 99 mg/dL   Comment 1 Notify RN    GLUCOSE, CAPILLARY     Status: Abnormal   Collection Time    08/31/12  9:35 PM      Result Value Range   Glucose-Capillary 123 (*) 70 - 99 mg/dL  GLUCOSE, CAPILLARY     Status: Abnormal   Collection Time    09/01/12  7:45 AM      Result Value Range   Glucose-Capillary 121 (*) 70 - 99 mg/dL  GLUCOSE, CAPILLARY     Status: None   Collection Time    09/01/12 11:47 AM      Result Value Range   Glucose-Capillary 92  70 - 99 mg/dL    No results found.  Positive for anxiety, bad mood, behavior problems, depression, mood swings, sleep disturbance and chronic renal failure and multiple psychosocial stresses. Blood pressure 94/58, pulse 70, temperature 98.2 F (36.8 C), temperature source Oral, resp. rate 18, height 6' (1.829 m), weight 60.7 kg (133 lb 13.1 oz), SpO2 96.00%.   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 Recommended admission to acute psychiatric hospitalization for crisis management and medication management. 2. Patient it is referred to the psychiatry social services for placement needs  3. Patient needed to placement as a after care needs.  4. Patient is currently homeless living in car. 5. Appreciate psychiatric consultation  Joe Vincent,Joe R. 09/01/2012, 3:28 PM

## 2012-09-02 LAB — RENAL FUNCTION PANEL
Albumin: 3.4 g/dL — ABNORMAL LOW (ref 3.5–5.2)
Calcium: 8.8 mg/dL (ref 8.4–10.5)
GFR calc Af Amer: 7 mL/min — ABNORMAL LOW (ref >90)
GFR calc non Af Amer: 6 mL/min — ABNORMAL LOW (ref >90)
Phosphorus: 3.6 mg/dL (ref 2.3–4.6)
Potassium: 6.3 mEq/L (ref 3.5–5.1)
Sodium: 121 mEq/L — ABNORMAL LOW (ref 135–145)

## 2012-09-02 LAB — CBC
Hemoglobin: 12.4 g/dL — ABNORMAL LOW (ref 13.0–17.0)
MCH: 32 pg (ref 26.0–34.0)
MCHC: 34.4 g/dL (ref 30.0–36.0)
RDW: 13.9 % (ref 11.5–15.5)

## 2012-09-02 LAB — GLUCOSE, CAPILLARY
Glucose-Capillary: 138 mg/dL — ABNORMAL HIGH (ref 70–99)
Glucose-Capillary: 74 mg/dL (ref 70–99)
Glucose-Capillary: 171 mg/dL — ABNORMAL HIGH (ref 70–99)

## 2012-09-02 MED ORDER — ALBUMIN HUMAN 25 % IV SOLN
25.0000 g | Freq: Once | INTRAVENOUS | Status: AC
  Administered 2012-09-02: 25 g via INTRAVENOUS

## 2012-09-02 MED ORDER — HEPARIN SODIUM (PORCINE) 1000 UNIT/ML IJ SOLN
2000.0000 [IU] | Freq: Once | INTRAMUSCULAR | Status: AC
  Administered 2012-09-02: 2000 [IU] via INTRAVENOUS

## 2012-09-02 NOTE — Clinical Social Work Psych Assess (Signed)
Psych CSW faxed information to [facility] per Director request.    Psych CSW attempted to assess pt.  Pt continues to be in dialysis.  Psych CSW will continue to follow and assess pt at a later date.  Vickii Penna, LCSWA [phone]  Clinical Social Work

## 2012-09-02 NOTE — Progress Notes (Signed)
[facility] PROGRESS NOTE  Joe Vincent UJW:[id number] DOB: 1961-01-15 DOA: 08/16/2012 PCP: [name], MD   Brief narrative 52 year old white male with past medical history of end-stage renal disease on dialysis ( TTS) --inconsistent), DM,, hypertension and CAD who presented to [facility] emergency room on 7/17 with shortness of breath. Patient apparently had been discharged from or had hospital the day prior. On admission he reported he was admitted to [facility] hospital recently for suicidal ideation and that he was to be transferred to behavioral health . He has been depressed and feels rejected from his family and friends and feels he cannot go back to living in his car. He was admitted to the hospitalist service and found to be volume overloaded which was the cause of the shortness of breath. After receiving dialysis, his symptoms resolved. Since that time, he has been awaiting transfer to a psychiatric facility . He has received daily consultation by Tele-psychiatry, who each day stated that patient  still felt to be suicidal. In order to provide better assessment and psych evaluation  patient was transferred to [facility].   Assessment/Plan:  Suicidal ideation  Seen by psych.  he has severe psychosocial issues and discharging him from hospital is unsafe as he has high likelyhood of harming himself. Patient currently suicidal as well. He recommends for inpatient behavior health for  short term vs group home where he can be stable. SW and CM aware. He should be admitted to inpatient [facility] for better care of is current issue.  continue sitter. continue celexa  ESRD  on HD . He seems to be irregular with his scheduled and shows up for HD whenever he feels like.  HD today. Am labs show hyperkalemia with k of 6.3. Asymptomatic. Will check 12 lEad EKG  DM  continue lantus and SSI   HTN  BP stable. continue amlodipine   CAD  stable   Tobacco abuse  counseled on cessation   Code  Status: full code  Family Communication: none  Disposition Plan pending.needs placement at inpatient behavioral health facility  Consultants:  Psych [place] [facility]  Procedures:  None   Antibiotics:  none  HPI/Subjective: Patient reports being suicidal.   Objective: Filed Vitals:   09/01/12 1740 09/01/12 2228 09/02/12 0627 09/02/12 0900  BP: 101/64 120/65 116/67 100/56  Pulse: 64 58 66 64  Temp: 98.4 F (36.9 C) 98.5 F (36.9 C) 98.1 F (36.7 C) 98.4 F (36.9 C)  TempSrc: Oral Oral Oral Oral  Resp: 18 20 18 18   Height:      Weight:      SpO2: 97% 98% 98% 99%    Intake/Output Summary (Last 24 hours) at 09/02/12 1049 Last data filed at 09/01/12 1700  Gross per 24 hour  Intake    360 ml  Output      0 ml  Net    360 ml   Filed Weights   08/30/12 1512 08/31/12 0500 08/31/12 2130  Weight: 63.3 kg (139 lb 8.8 oz) 64.501 kg (142 lb 3.2 oz) 60.7 kg (133 lb 13.1 oz)    Exam: General: Middle aged male in NAD  HEENT: no pallor , moist mucosa  Chest: clear b/l, no added sounds  CVS: NS1&S2, no murmurs  Abd: soft, NT, ND, BS+  Ext: warm, rt AV fistula  CNS: AAOX3   Data Reviewed: Basic Metabolic Panel:  Recent Labs Lab 08/27/12 0447 08/28/12 0631 08/29/12 0604 08/30/12 0501 08/31/12 0452 09/02/12 0825  NA 130* 126* 131*  134* 133* 121*  K 4.5 5.7* 5.0 5.5* 4.6 6.3*  CL 94* 92* 97 100 95* 89*  CO2 30 25 27 27  32 22  GLUCOSE 167* 124* 122* 68* 101* 72  BUN 19 30* 20 33* 18 40*  CREATININE 5.44* 7.21* 5.67* 7.68* 5.77* 9.22*  CALCIUM 8.4 8.0* 8.4 8.6 9.0 8.8  PHOS 3.5  --   --  3.2  --  3.6   Liver Function Tests:  Recent Labs Lab 08/28/12 0631 09/02/12 0825  AST 46*  --   ALT 63*  --   ALKPHOS 151*  --   BILITOT 0.2*  --   PROT 6.5  --   ALBUMIN 2.9* 3.4*   No results found for this basename: LIPASE, AMYLASE,  in the last 168 hours No results found for this basename: AMMONIA,  in the last 168 hours CBC:  Recent Labs Lab 08/28/12 0631  09/02/12 0825  WBC 4.6 6.8  HGB 11.0* 12.4*  HCT 33.1* 36.0*  MCV 94.3 92.8  PLT 156 184   Cardiac Enzymes: No results found for this basename: CKTOTAL, CKMB, CKMBINDEX, TROPONINI,  in the last 168 hours BNP (last 3 results)  Recent Labs  08/17/12 0746  PROBNP 3324.0*   CBG:  Recent Labs Lab 09/01/12 0745 09/01/12 1147 09/01/12 1616 09/01/12 2226 09/02/12 0728  GLUCAP 121* 92 153* 138* 74    No results found for this or any previous visit (from the past 240 hour(s)).   Studies: No results found.  Scheduled Meds: . b complex-vitamin c-folic acid  1 tablet Oral Daily  . citalopram  20 mg Oral Daily  . diazepam  10 mg Oral BID  . doxercalciferol  1 mcg Intravenous Q T,Th,Sa-HD  . heparin  5,000 Units Subcutaneous Q8H  . insulin aspart  0-15 Units Subcutaneous TID WC  . insulin glargine  15 Units Subcutaneous QHS  . nicotine  21 mg Transdermal Daily  . pantoprazole  40 mg Oral Daily  . sucralfate  1 g Oral TID WC & HS   Continuous Infusions:     Time spent: 5 minutes    Julicia Krieger  [facility] Pager [phone]. If 7PM-7AM, please contact night-coverage at [email or web address], password [facility] 09/02/2012, 10:49 AM  LOS: 17 days

## 2012-09-02 NOTE — Progress Notes (Signed)
Pt verbalized plan this am of suicidal ideation, states at this time plans to jump in front of a semi-truck. Will continue to monitor, MD aware.

## 2012-09-02 NOTE — Procedures (Signed)
I have personally attended patient's hemodialysis treatment. Right upper AVF Pre dialysis potassium 6.3  Plan 4 hour treatment on a 2K bath 400/800 Today's treatment gets him back on his usual TTS schedule  Weight is 60.4 which is under previous outpt EDW so will take off only 0.5 liters (no edema)  Hb 12 so no ESA's needed  Ca 8.8 Phos 3.6 without binders

## 2012-09-02 NOTE — Progress Notes (Signed)
CRITICAL VALUE ALERT  Critical value received:  Potassium 6.3 /Sodium 121  Date of notification:  09/02/12  Time of notification:  943am  Critical value read back:yes  Nurse who received alert:  Adaline Sill  MD notified (1st page):  [place] in person  Time of first page:  [phone]  MD notified (2nd page):  Time of second page:  Responding MD:    Time MD responded:

## 2012-09-03 LAB — RENAL FUNCTION PANEL
Albumin: 3.3 g/dL — ABNORMAL LOW (ref 3.5–5.2)
BUN: 15 mg/dL (ref 6–23)
Calcium: 8.7 mg/dL (ref 8.4–10.5)
Chloride: 97 mEq/L (ref 96–112)
Creatinine, Ser: 6 mg/dL — ABNORMAL HIGH (ref 0.50–1.35)
GFR calc non Af Amer: 10 mL/min — ABNORMAL LOW (ref >90)

## 2012-09-03 LAB — GLUCOSE, CAPILLARY
Glucose-Capillary: 318 mg/dL — ABNORMAL HIGH (ref 70–99)
Glucose-Capillary: 195 mg/dL — ABNORMAL HIGH (ref 70–99)

## 2012-09-03 MED ORDER — HYDROMORPHONE HCL 2 MG PO TABS
2.0000 mg | ORAL_TABLET | Freq: Four times a day (QID) | ORAL | Status: DC | PRN
Start: 2012-09-03 — End: 2012-09-04
  Administered 2012-09-03 – 2012-09-04 (×3): 2 mg via ORAL
  Filled 2012-09-03 (×3): qty 1

## 2012-09-03 NOTE — Clinical Social Work Psych Assess (Signed)
Clinical Social Work Department CLINICAL SOCIAL WORK PSYCHIATRY SERVICE LINE ASSESSMENT 09/03/2012  Patient:  Joe Vincent  Account:  [id number]  Admit Date:  08/16/2012  Clinical Social Worker:  Read Drivers  Date/Time:  09/03/2012 02:03 PM Referred by:  Physician  Date referred:  09/03/2012 Reason for Referral  Behavioral Health Issues   Presenting Symptoms/Problems (In the person's/family's own words):   Psych was consulted for suicide ideation and disposition recommendation.   Abuse/Neglect/Trauma History (check all that apply)  Denies history   Abuse/Neglect/Trauma Comments:   none reported or noted in chart   Psychiatric History (check all that apply)  Inpatient/hospitilization   Psychiatric medications:  citalopram (CELEXA) tablet 20 mg  Dose: 20 mg Freq: Daily Route: PO  Start: 08/23/12 2100    diazepam (VALIUM) tablet 10 mg  Dose: 10 mg Freq: 2 times daily Route: PO  Start: 08/16/12 2200    zolpidem (AMBIEN) tablet 10 mg  Dose: 10 mg Freq: At bedtime PRN Route: PO  PRN Reason: sleep  Start: 08/16/12 2120   Current Mental Health Hospitalizations/Previous Mental Health History:   no current MH providers    inpatient psychiatric admission in [place] 10+ years ago when pt was going through divorce.  Admission was for depression and crisis management.   Current provider:   none   Place and Date:   none   Current Medications:   Scheduled Meds:      . b complex-vitamin c-folic acid  1 tablet Oral Daily  . citalopram  20 mg Oral Daily  . diazepam  10 mg Oral BID  . doxercalciferol  1 mcg Intravenous Q T,Th,Sa-HD  . heparin  5,000 Units Subcutaneous Q8H  . insulin aspart  0-15 Units Subcutaneous TID WC  . insulin glargine  15 Units Subcutaneous QHS  . nicotine  21 mg Transdermal Daily  . pantoprazole  40 mg Oral Daily  . sucralfate  1 g Oral TID WC & HS        Continuous Infusions:      PRN Meds:.acetaminophen, feeding supplement (NEPRO CARB STEADY),  heparin, ibuprofen, lidocaine (PF), lidocaine-prilocaine, ondansetron (ZOFRAN) IV, ondansetron, oxyCODONE, pentafluoroprop-tetrafluoroeth, zolpidem       Previous Impatient Admission/Date/Reason:   please see above comments   Emotional Health / Current Symptoms    Suicide/Self Harm  Has a plan for suicide  Suicidal ideation (ex: "I can't take any more,I wish I could disappear")  Suicide attempt in past (date/description)   Suicide attempt in the past:   pt reports trying to overdose on excederin prior to this hospitalization.  Pt states that he often thinks of suicide as a way out and feels hopeless re: his future   Other harmful behavior:   none reported or noted   Psychotic/Dissociative Symptoms  None reported   Other Psychotic/Dissociative Symptoms:   none reported or noted in chart    Attention/Behavioral Symptoms  Withdrawn   Other Attention / Behavioral Symptoms:   none reported or noted in chart    Cognitive Impairment  Orientation - Place  Orientation - Self  Orientation - Situation  Orientation - Time  Poor/Impaired Decision-Making   Other Cognitive Impairment:   none reported or noted in chart    Mood and Adjustment  DEPRESSION    Stress, Anxiety, Trauma, Any Recent Loss/Stressor  None reported   Anxiety (frequency):   Moderate anxiety re: housing situation; relationship with children; and medical condition   Phobia (specify):   none reported or noted in chart  Compulsive behavior (specify):   none reported or noted in chart   Obsessive behavior (specify):   none reported or noted in chart   Other:   none reported (other than listed above) or noted in chart   Substance Abuse/Use  None   SBIRT completed (please refer for detailed history):  NA  Self-reported substance use:   pt denies use   Urinary Drug Screen Completed:  NA Alcohol level:   untested    Environmental/Housing/Living Arrangement  HOMELESS   Who is in the home:    pt is homeless and states that he has no support.  Per chart review and handoff from [facility] CSW, pt has sister who is supportive and assisting with pt getting an apartment   Emergency contact:  pt listed "none" [name] is listed on the facesheet.  This was not confirmed with pt.   Financial  Social Security Disability Income   Patient's Strengths and Goals (patient's own words):   Pt is compliant with medical advice.  Pt understands how to access healthcare.  Pt is insured and has dialysis set up and transportation to and from dialysis.  Pt recognizes he is having difficulty with his current situation.   Clinical Social Worker's Interpretive Summary:   Psych CSW assessed pt at bedside.  Pt was alert and oriented x4.  Pt currently has a sitter who was asked to step out for our assessment.  Pt denies HI.  Pt reports being suicidal and states he has been for months.  Pt reports no AHVD.  Pt reports prior to admission that he took "a handful of excederine" trying to overdose, but states "it didn't do anything".  pt states that he is homeless and is on dialysis.  pt states that he receives a disability check and only has a little over $600 left after paying bills.  Per handoff from [facility] Social Worker, pt sister is helping pt find another apartment.  Pt states that he has two children of whom he is astranged from.  Their ages are 104 and 44.  Pt states he sees them "maybe once every 6 months".  Pt states that homelessness is a trigger for his suicial ideation.  Pt states that his current plan would be to wreck his car into a tree.  Pt is requesting inpatient psychiatric treatment at this time. Psych CSW explained to pt the barrier to inpatient treatment would be the need for pt to undergo dialysis Tues, Thurs and Saturdays.  Psych CSW explained to pt that most psychiatric hospitals do not have a dialysis center or provide transportation to the dialysis centers.  Psych CSW  explained that this was probably one of the reasons that pt was on the waiting list for [facility].  Pt acknowledged understanding.   Disposition:  Inpatient referral made [facility], [facility]-psych)  Vickii Penna, [place] [phone]  Clinical Social Work

## 2012-09-03 NOTE — Care Management Note (Signed)
Noted consult for assistance with medication. However this pt has Medicare and Medicaid and therefore has a minimal copay for his medications. He is not eligible for assistance with medications.

## 2012-09-03 NOTE — Progress Notes (Signed)
Chaplain responded to spiritual care consult for "suicidal thoughts." Room door was open and sitter was at the door awaiting her replacement. Pt was sitting on side of bed facing me. I introduced myself and asked if we could visit but he declined. He did confirm having suicidal thoughts and expressed the hope that he could leave tomorrow. I asked whether I could come again another day and he said yes.

## 2012-09-03 NOTE — Progress Notes (Signed)
[facility] PROGRESS NOTE  Joe Vincent XLK:[id number] DOB: [birth year] DOA: 08/16/2012 PCP: [name], MD  Brief narrative  52 year old white male with past medical history of end-stage renal disease on dialysis ( TTS) --inconsistent), DM,, hypertension and CAD who presented to [facility] emergency room on 7/17 with shortness of breath. Patient apparently had been discharged from or had hospital the day prior. On admission he reported he was admitted to [name] hospital recently for suicidal ideation and that he was to be transferred to behavioral health . He has been depressed and feels rejected from his family and friends and feels he cannot go back to living in his car. He was admitted to the hospitalist service and found to be volume overloaded which was the cause of the shortness of breath. After receiving dialysis, his symptoms resolved. Since that time, he has been awaiting transfer to a psychiatric facility . He has received daily consultation by Tele-psychiatry, who each day stated that patient still felt to be suicidal. In order to provide better assessment and psych evaluation patient was transferred to [name].   Assessment/Plan:  Suicidal ideation  Seen by psych. he has severe psychosocial issues and discharging him from hospital is unsafe as he has high likelyhood of harming himself. Patient currently suicidal as well. He recommends for inpatient behavior health for short term vs group home where he can be stable. SW and CM aware. He should be admitted to inpatient [facility] for better care of is current issue.  continue sitter.  continue celexa   ESRD  on HD . He seems to be irregular with his scheduled and shows up for HD whenever he feels like.  HD done on 7/31  DM  continue lantus and SSI   HTN  BP stable. continue amlodipine   CAD  stable   Tobacco abuse  counseled on cessation   Code Status: full code  Family Communication: none  Disposition Plan  pending.needs placement at inpatient behavioral health facility . As per SW will be reevaluated by psych on 8/1 and determine disposition.   Consultants:  Psych  [name]   Procedures:  None   Antibiotics:  none HPI/Subjective:  Patient reports being suicidal off and on    Objective: Filed Vitals:   09/02/12 2119 09/03/12 0455 09/03/12 1359 09/03/12 1410  BP: 132/72 97/62 77/37  104/46  Pulse: 67 77 58 58  Temp: 99.5 F (37.5 C) 99.2 F (37.3 C) 98 F (36.7 C)   TempSrc: Oral Oral Oral   Resp: 18 16 17    Height: 5\' 8"  (1.727 m)     Weight: 60.5 kg (133 lb 6.1 oz)     SpO2: 98% 94% 100%     Intake/Output Summary (Last 24 hours) at 09/03/12 1516 Last data filed at 09/02/12 1526  Gross per 24 hour  Intake      0 ml  Output   -296 ml  Net    296 ml   Filed Weights   09/02/12 1115 09/02/12 1600 09/02/12 2119  Weight: 60.4 kg (133 lb 2.5 oz) 59.6 kg (131 lb 6.3 oz) 60.5 kg (133 lb 6.1 oz)     Exam: General: Middle aged male in NAD  HEENT: no pallor , moist mucosa  Chest: clear b/l, no added sounds  CVS: NS1&S2, no murmurs  Abd: soft, NT, ND, BS+  Ext: warm, rt AV fistula  CNS: AAOX3  Data Reviewed: Basic Metabolic Panel:  Recent Labs Lab 08/29/12 0604 08/30/12 0501 08/31/12 3664  09/02/12 0825 09/03/12 0435  NA 131* 134* 133* 121* 136  K 5.0 5.5* 4.6 6.3* 5.1  CL 97 100 95* 89* 97  CO2 27 27 32 22 29  GLUCOSE 122* 68* 101* 72 101*  BUN 20 33* 18 40* 15  CREATININE 5.67* 7.68* 5.77* 9.22* 6.00*  CALCIUM 8.4 8.6 9.0 8.8 8.7  PHOS  --  3.2  --  3.6 3.6   Liver Function Tests:  Recent Labs Lab 08/28/12 0631 09/02/12 0825 09/03/12 0435  AST 46*  --   --   ALT 63*  --   --   ALKPHOS 151*  --   --   BILITOT 0.2*  --   --   PROT 6.5  --   --   ALBUMIN 2.9* 3.4* 3.3*   No results found for this basename: LIPASE, AMYLASE,  in the last 168 hours No results found for this basename: AMMONIA,  in the last 168 hours CBC:  Recent Labs Lab  08/28/12 0631 09/02/12 0825  WBC 4.6 6.8  HGB 11.0* 12.4*  HCT 33.1* 36.0*  MCV 94.3 92.8  PLT 156 184   Cardiac Enzymes: No results found for this basename: CKTOTAL, CKMB, CKMBINDEX, TROPONINI,  in the last 168 hours BNP (last 3 results)  Recent Labs  08/17/12 0746  PROBNP 3324.0*   CBG:  Recent Labs Lab 09/02/12 0728 09/02/12 1716 09/02/12 2123 09/03/12 0828 09/03/12 1230  GLUCAP 74 92 171* 318* 74    No results found for this or any previous visit (from the past 240 hour(s)).   Studies: No results found.  Scheduled Meds: . b complex-vitamin c-folic acid  1 tablet Oral Daily  . citalopram  20 mg Oral Daily  . diazepam  10 mg Oral BID  . doxercalciferol  1 mcg Intravenous Q T,Th,Sa-HD  . heparin  5,000 Units Subcutaneous Q8H  . insulin aspart  0-15 Units Subcutaneous TID WC  . insulin glargine  15 Units Subcutaneous QHS  . nicotine  21 mg Transdermal Daily  . pantoprazole  40 mg Oral Daily  . sucralfate  1 g Oral TID WC & HS   Continuous Infusions:     Time spent:25 minutes    [name]  [facility] Pager [phone] If 7PM-7AM, please contact night-coverage at [email or web address], password [facility] 09/03/2012, 3:16 PM  LOS: 18 days

## 2012-09-03 NOTE — Clinical Social Work Psych Note (Addendum)
Per Medical Director, psychiatry will re-assess pt tomorrow (Saturday) for disposition.  If psychiatry feels that pt is safe to d/c - pt will d/c otherwise, they will make recommendation to possibly be admitted to [facility].    Psych CSW made Medical Director aware.  CSW received a call from Arlington Calix at [facility].  Pt currently denied based on medical acuity due to the dialysis treatment.  Per Sheralyn Boatman, [facility] is not currently taking dialysis patients.    CSW made Director aware.  Vickii Penna, LCSWA [phone]  Clinical Social Work

## 2012-09-03 NOTE — Progress Notes (Signed)
Subjective: No complaints Did OK with HD yesterday Still no psyche disposition  Objective Vital signs in last 24 hours: Filed Vitals:   09/02/12 1600 09/02/12 1820 09/02/12 2119 09/03/12 0455  BP: 111/67 109/64 132/72 97/62  Pulse: 70 77 67 77  Temp: 98.2 F (36.8 C) 98.8 F (37.1 C) 99.5 F (37.5 C) 99.2 F (37.3 C)  TempSrc: Oral Oral Oral Oral  Resp: 19 18 18 16   Height:   5\' 8"  (1.727 m)   Weight: 59.6 kg (131 lb 6.3 oz)  60.5 kg (133 lb 6.1 oz)   SpO2:  95% 98% 94%   Weight change:   Intake/Output Summary (Last 24 hours) at 09/03/12 1243 Last data filed at 09/02/12 1526  Gross per 24 hour  Intake      0 ml  Output   -296 ml  Net    296 ml   Physical Exam:  BP 97/62  Pulse 77  Temp(Src) 99.2 F (37.3 C) (Oral)  Resp 16  Ht 5\' 8"  (1.727 m)  Wt 60.5 kg (133 lb 6.1 oz)  BMI 20.28 kg/m2  SpO2 94% Gen: Tall thin WM NAD   Skin: Tattoos no rashes  Neck: no JVD, no bruits or LAN  Chest: Clear Heart: S1S2 no S3 or rub No murmur  Abdomen: soft, non tender  Ext: no edema Right upper arm AVF well developed with good bruit and thrill  Neuro: alert, Ox3, no focal deficit   Labs:   Recent Labs Lab 08/28/12 0631 08/29/12 0604 08/30/12 0501 08/31/12 0452 09/02/12 0825 09/03/12 0435  NA 126* 131* 134* 133* 121* 136  K 5.7* 5.0 5.5* 4.6 6.3* 5.1  CL 92* 97 100 95* 89* 97  CO2 25 27 27  32 22 29  GLUCOSE 124* 122* 68* 101* 72 101*  BUN 30* 20 33* 18 40* 15  CREATININE 7.21* 5.67* 7.68* 5.77* 9.22* 6.00*  CALCIUM 8.0* 8.4 8.6 9.0 8.8 8.7  PHOS  --   --  3.2  --  3.6 3.6    Recent Labs Lab 08/28/12 0631 09/02/12 0825 09/03/12 0435  AST 46*  --   --   ALT 63*  --   --   ALKPHOS 151*  --   --   BILITOT 0.2*  --   --   PROT 6.5  --   --   ALBUMIN 2.9* 3.4* 3.3*   Recent Labs Lab 08/28/12 0631 09/02/12 0825  WBC 4.6 6.8  HGB 11.0* 12.4*  HCT 33.1* 36.0*  MCV 94.3 92.8  PLT 156 184    Recent Labs Lab 09/02/12 0728 09/02/12 1716 09/02/12 2123  09/03/12 0828 09/03/12 1230  GLUCAP 74 92 171* 318* 74    Studies/Results: No results found. Medications:   . b complex-vitamin c-folic acid  1 tablet Oral Daily  . citalopram  20 mg Oral Daily  . diazepam  10 mg Oral BID  . doxercalciferol  1 mcg Intravenous Q T,Th,Sa-HD  . heparin  5,000 Units Subcutaneous Q8H  . insulin aspart  0-15 Units Subcutaneous TID WC  . insulin glargine  15 Units Subcutaneous QHS  . nicotine  21 mg Transdermal Daily  . pantoprazole  40 mg Oral Daily  . sucralfate  1 g Oral TID WC & HS   I  have reviewed scheduled and prn medications. Outpatient dialysis  prescription is TTS, 4 hours, 2K2.5Ca, EDW 61.6 kg, 15 gu sharps, right upper arm AVF, 2000 units heparin, Hectorol 1 mcg TIW, no EPO.  ASSESSMENT/PLANS 52 yo WM with DM, HTN, ESRD on TTS HD ([facility]), depression with suicidal thoughts, transferred from [facility] for formal face to face psychiatric evaluation.   ESRD  Outpt TTS Is below prev EDW   Anemia  No ESA's needed  CKD-MBD  Ca/phos normal on no binders at present  No PTH data but is on TIW hectorol   Psych issues with reported suicidal thoughts  Awaiting placement at inpt psyche unit   Camille Bal, MD [facility] [phone] pager 09/03/2012, 12:43 PM

## 2012-09-03 NOTE — Clinical Social Work Psych Note (Signed)
Psych CSW spoke to both Joe Vincent and Arlington Calix at [facility] who stated they would review pt for appropriateness, but was not projected to have bed availability.  Psych CSW continuing to assist.  Vickii Penna, LCSWA [phone]  Clinical Social Work

## 2012-09-04 DIAGNOSIS — F339 Major depressive disorder, recurrent, unspecified: Secondary | ICD-10-CM

## 2012-09-04 LAB — RENAL FUNCTION PANEL
Albumin: 3.2 g/dL — ABNORMAL LOW (ref 3.5–5.2)
CO2: 23 mEq/L (ref 19–32)
Chloride: 89 mEq/L — ABNORMAL LOW (ref 96–112)
Creatinine, Ser: 7.56 mg/dL — ABNORMAL HIGH (ref 0.50–1.35)
GFR calc Af Amer: 9 mL/min — ABNORMAL LOW (ref >90)
GFR calc non Af Amer: 7 mL/min — ABNORMAL LOW (ref >90)
Sodium: 124 mEq/L — ABNORMAL LOW (ref 135–145)

## 2012-09-04 LAB — GLUCOSE, CAPILLARY
Glucose-Capillary: 51 mg/dL — ABNORMAL LOW (ref 70–99)
Glucose-Capillary: 111 mg/dL — ABNORMAL HIGH (ref 70–99)
Glucose-Capillary: 129 mg/dL — ABNORMAL HIGH (ref 70–99)

## 2012-09-04 LAB — MRSA PCR SCREENING: MRSA by PCR: NEGATIVE

## 2012-09-04 MED ORDER — HYDROMORPHONE HCL 2 MG PO TABS
2.0000 mg | ORAL_TABLET | Freq: Three times a day (TID) | ORAL | Status: DC | PRN
  Administered 2012-09-04 – 2012-09-05 (×3): 4 mg via ORAL
  Filled 2012-09-04 (×3): qty 2

## 2012-09-04 NOTE — Progress Notes (Signed)
Joe [id number]  Subjective Patient was asked to see as a second opinion.  Patient's seen and chart reviewed.  Patient remains very depressed and continues to have suicidal thoughts with plan to either shoot himself or drive his car to trees.  Patient reported his brother-in-law has gun and he has access to it if he needed. Patient endorses excessive financial distress.  He is homeless and he has no place to go.  He is living in his car.  He reported hopeless helpless and no desire to live.  He could not contract for safety.  Mental Status Examination Patient appears depressed and maintained fair eye contact.  His speech is slow and coherent.  He endorses positive suicidal thinking and plan to kill himself.  He denies any hallucinations or paranoia.  His affect is constricted.  There were no paranoia or delusions present at this time.  His fund of knowledge is adequate.  There were no tremors or shakes.  His insight judgment is limited.  Current Medication Current facility-administered medications:acetaminophen (TYLENOL) tablet 650 mg, 650 mg, Oral, Q4H PRN, [name], MD;  b complex-vitamin c-folic acid (NEPHRO-VITE) tablet 1 tablet, 1 tablet, Oral, Daily, [name], MD, 1 tablet at 09/04/12 1008;  citalopram (CELEXA) tablet 20 mg, 20 mg, Oral, Daily, Osvaldo Shipper, MD, 20 mg at 09/04/12 1008;  diazepam (VALIUM) tablet 10 mg, 10 mg, Oral, BID, [name], MD, 10 mg at 09/04/12 1008 doxercalciferol (HECTOROL) injection 1 mcg, 1 mcg, Intravenous, Q T,Th,Sa-HD, Sadie Haber, MD;  feeding supplement (NEPRO CARB STEADY) liquid 237 mL, 237 mL, Oral, PRN, Jamse Mead, MD;  heparin injection 1,000 Units, 1,000 Units, Dialysis, PRN, Jamse Mead, MD;  heparin injection 5,000 Units, 5,000 Units, Subcutaneous, Q8H, Gwenyth Bender, NP, 5,000 Units at 09/03/12 2234 HYDROmorphone (DILAUDID) tablet 2-4 mg, 2-4 mg, Oral, TID PRN, Richarda Overlie, MD, 4 mg at 09/04/12 1433;  ibuprofen  (ADVIL,MOTRIN) tablet 600 mg, 600 mg, Oral, Q8H PRN, [name], MD;  insulin aspart (novoLOG) injection 0-15 Units, 0-15 Units, Subcutaneous, TID WC, Gwenyth Bender, NP, 1 Units at 09/04/12 1224;  insulin glargine (LANTUS) injection 15 Units, 15 Units, Subcutaneous, QHS, Erick Blinks, MD, 15 Units at 09/03/12 2231 lidocaine (PF) (XYLOCAINE) 1 % injection 5 mL, 5 mL, Intradermal, PRN, Jamse Mead, MD;  lidocaine-prilocaine (EMLA) cream 1 application, 1 application, Topical, PRN, Jamse Mead, MD;  nicotine (NICODERM CQ - dosed in mg/24 hours) patch 21 mg, 21 mg, Transdermal, Daily, [name], MD, 21 mg at 09/04/12 1008;  ondansetron (ZOFRAN) injection 4 mg, 4 mg, Intravenous, Q6H PRN, Gwenyth Bender, NP, 4 mg at 08/21/12 2134 ondansetron (ZOFRAN) tablet 4 mg, 4 mg, Oral, Q6H PRN, Gwenyth Bender, NP;  oxyCODONE (Oxy IR/ROXICODONE) immediate release tablet 5 mg, 5 mg, Oral, Q4H PRN, Osvaldo Shipper, MD, 5 mg at 08/20/12 2239;  pantoprazole (PROTONIX) EC tablet 40 mg, 40 mg, Oral, Daily, Lesle Chris Black, NP, 40 mg at 09/04/12 1008;  pentafluoroprop-tetrafluoroeth (GEBAUERS) aerosol 1 application, 1 application, Topical, PRN, Jamse Mead, MD sucralfate (CARAFATE) tablet 1 g, 1 g, Oral, TID WC & HS, Lesle Chris Black, NP, 1 g at 09/04/12 1223;  zolpidem (AMBIEN) tablet 10 mg, 10 mg, Oral, QHS PRN, [name], MD, 10 mg at 09/02/12 2234   Assessment Axis I Maj. depressive disorder recurrent Axis II deferred Axis III see medical history Axis IV severe  Plan I agreed with psychiatry consultation liaison service  opinion.  Patient does need inpatient treatment.  Psychiatry social worker is working for his placement.  Patient is a danger to himself if untreated.  Call consultation liaison services if needed.

## 2012-09-04 NOTE — Progress Notes (Addendum)
Pt appears to be agitated and restless on assessment. Pt refused his 10mg  of valium at 10pm. Pt refused ambien at bedtime after reporting concern for difficulty sleeping.  Pt reports that he is in pain. Educated pt that his pain medication would be available to be received at 2AM. Pt starts cursing, stating "I want out of here" "these doctors don't know me" and " I was taking dilaudid 4 mg pills at home". Educated pt on his need to stay based on his need for placement to behavioral health. Pt finally just agrees to take his pills as previously orderd.Christell Constant, Dirk Dress

## 2012-09-04 NOTE — Progress Notes (Signed)
Pt had discussed with Dr. Eliott Nine that he only wanted a partial dialysis Tx today (initially did not want to come at all). Pt ran 3hr of 4 hr Tx. Had frequent drops in BP thought the Tx and often had to come out of UF. Post wieght was more then pre weight despite re-weighing pt on the same stand up scales pre and post Tx.

## 2012-09-04 NOTE — Progress Notes (Signed)
Subjective: Initially refused to dialyze today but now agrees to at least a partial treatment  Objective Vital signs in last 24 hours: Filed Vitals:   09/03/12 0455 09/03/12 1359 09/03/12 1410 09/03/12 2109  BP: 97/62 77/37 104/46 148/77  Pulse: 77 58 58 63  Temp: 99.2 F (37.3 C) 98 F (36.7 C)  98.7 F (37.1 C)  TempSrc: Oral Oral  Oral  Resp: 16 17  20   Height:    5\' 8"  (1.727 m)  Weight:    63.776 kg (140 lb 9.6 oz)  SpO2: 94% 100%  96%   Weight change: 3.376 kg (7 lb 7.1 oz)  Intake/Output Summary (Last 24 hours) at 09/04/12 1301 Last data filed at 09/04/12 0218  Gross per 24 hour  Intake   1060 ml  Output      0 ml  Net   1060 ml   Physical Exam:  BP 148/77  Pulse 63  Temp(Src) 98.7 F (37.1 C) (Oral)  Resp 20  Ht 5\' 8"  (1.727 m)  Wt 63.776 kg (140 lb 9.6 oz)  BMI 21.38 kg/m2  SpO2 96% Gen: Tall thin WM NAD   Skin: Tattoos no rashes  Neck: no JVD, no bruits or LAN  Chest: Clear Heart: S1S2 no S3 or rub No murmur  Abdomen: soft, non tender  Ext: no edema Right upper arm AVF well developed with good bruit and thrill some bruising Neuro: alert, Ox3, no focal deficit     Recent Labs Lab 08/29/12 0604 08/30/12 0501 08/31/12 0452 09/02/12 0825 09/03/12 0435 09/04/12 0808  NA 131* 134* 133* 121* 136 124*  K 5.0 5.5* 4.6 6.3* 5.1 5.5*  CL 97 100 95* 89* 97 89*  CO2 27 27 32 22 29 23   GLUCOSE 122* 68* 101* 72 101* 76  BUN 20 33* 18 40* 15 25*  CREATININE 5.67* 7.68* 5.77* 9.22* 6.00* 7.56*  CALCIUM 8.4 8.6 9.0 8.8 8.7 8.5  PHOS  --  3.2  --  3.6 3.6 3.0    Recent Labs Lab 09/02/12 0825 09/03/12 0435 09/04/12 0808  ALBUMIN 3.4* 3.3* 3.2*    Recent Labs Lab 09/02/12 0825  WBC 6.8  HGB 12.4*  HCT 36.0*  MCV 92.8  PLT 184    Recent Labs Lab 09/03/12 1725 09/03/12 2147 09/04/12 0737 09/04/12 0840 09/04/12 1126  GLUCAP 222* 195* 51* 111* 129*    Studies/Results: No results found. Medications:   . b complex-vitamin c-folic acid   1 tablet Oral Daily  . citalopram  20 mg Oral Daily  . diazepam  10 mg Oral BID  . doxercalciferol  1 mcg Intravenous Q T,Th,Sa-HD  . heparin  5,000 Units Subcutaneous Q8H  . insulin aspart  0-15 Units Subcutaneous TID WC  . insulin glargine  15 Units Subcutaneous QHS  . nicotine  21 mg Transdermal Daily  . pantoprazole  40 mg Oral Daily  . sucralfate  1 g Oral TID WC & HS   I  have reviewed scheduled and prn medications. Outpatient dialysis  prescription is TTS, 4 hours, 2K2.5Ca, EDW 61.6 kg, 15 gu sharps, right upper arm AVF, 2000 units heparin, Hectorol 1 mcg TIW, no EPO.   ASSESSMENT/PLANS 52 yo WM with DM, HTN, ESRD on TTS HD ([facility]), depression with suicidal thoughts, transferred from [facility] for formal face to face psychiatric evaluation.   ESRD  Outpt TTS Is below prev EDW K up and says will only HD "partial treatment" so will HD on 1K for  first 2 hours   Anemia  No ESA's needed  CKD-MBD  Ca/phos normal on no binders at present  No PTH data but is on TIW hectorol   Psych issues with reported suicidal thoughts  Awaiting placement at inpt psyche unit   Camille Bal, MD [facility] [phone] pager 09/04/2012, 1:01 PM

## 2012-09-04 NOTE — Consult Note (Signed)
Reason for Consult: Depression and suicidal Referring Physician: Dr. Josiah Lobo is an 52 y.o. male.  HPI: Patient has been depression and had a suicidal attempt prior to referred from the [facility] in [place], [place] and he was waited for placement at [facility] center without success. Patient has been transferred to [facility] for dialysis. He has been diagnosed with Chronic Renal Failure. He has been reportedly suffering with a depression over a few months since he become homeless, unable to leave with his brother in law OR his friends who are doing alcohol and drugs. Patient has a case manager who was not able to find a placement for him due to he refused to pay his disability funds. Patient has been living in his car and has been receiving disability checks monthly. Patient continues to report ending his life than living homeless and unable to get medical help. His plans is taking overdose of medication. Patient cannot contract for safety.  Mental Status Examination: Patient was in dialysis as per the staff nurse. He has been working with hospital social service regarding appropriate placement.  Past Medical History  Diagnosis Date  . Diabetes mellitus   . Hypertension   . GERD (gastroesophageal reflux disease)   . Retinopathy   . Peripheral neuropathy   . Chronic kidney disease   . Chest pain     a. 05/2011 Cath: nonobs dzs.  . Myocardial infarction 2000  . Depression   . Liver disease   . Cancer     Colon    Past Surgical History  Procedure Laterality Date  . Colostomy  Ileostomy    colon cancer  . Amputation      left pinky toe  . Esophagogastroduodenoscopy  05/23/2011    Procedure: ESOPHAGOGASTRODUODENOSCOPY (EGD);  Surgeon: Theda Belfast, MD;  Location: [facility] ENDOSCOPY;  Service: Endoscopy;  Laterality: N/A;  . Colon surgery  2009  . Cardiac catheterization  2012  . Eye surgery      Family History  Problem Relation Age of Onset  . Cancer  Mother   . Heart disease Father   . Cancer Father     Social History:  reports that he has been smoking Cigarettes and Cigars.  He has a 15 pack-year smoking history. He has never used smokeless tobacco. He reports that he does not drink alcohol or use illicit drugs.  Allergies:  Allergies  Allergen Reactions  . Codeine Swelling    Medications: I have reviewed the patient's current medications.  Results for orders placed during the hospital encounter of 08/16/12 (from the past 48 hour(s))  GLUCOSE, CAPILLARY     Status: None   Collection Time    09/02/12  5:16 PM      Result Value Range   Glucose-Capillary 92  70 - 99 mg/dL  GLUCOSE, CAPILLARY     Status: Abnormal   Collection Time    09/02/12  9:23 PM      Result Value Range   Glucose-Capillary 171 (*) 70 - 99 mg/dL  RENAL FUNCTION PANEL     Status: Abnormal   Collection Time    09/03/12  4:35 AM      Result Value Range   Sodium 136  135 - 145 mEq/L   Comment: DELTA CHECK NOTED     REPEATED TO VERIFY   Potassium 5.1  3.5 - 5.1 mEq/L   Comment: DELTA CHECK NOTED     REPEATED TO VERIFY   Chloride 97  96 - 112 mEq/L   Comment: DELTA CHECK NOTED     REPEATED TO VERIFY   CO2 29  19 - 32 mEq/L   Glucose, Bld 101 (*) 70 - 99 mg/dL   BUN 15  6 - 23 mg/dL   Comment: DELTA CHECK NOTED     REPEATED TO VERIFY   Creatinine, Ser 6.00 (*) 0.50 - 1.35 mg/dL   Comment: DELTA CHECK NOTED     REPEATED TO VERIFY   Calcium 8.7  8.4 - 10.5 mg/dL   Phosphorus 3.6  2.3 - 4.6 mg/dL   Albumin 3.3 (*) 3.5 - 5.2 g/dL   GFR calc non Af Amer 10 (*) >90 mL/min   GFR calc Af Amer 11 (*) >90 mL/min   Comment:            The eGFR has been calculated     using the CKD EPI equation.     This calculation has not been     validated in all clinical     situations.     eGFR's persistently     <90 mL/min signify     possible Chronic Kidney Disease.  GLUCOSE, CAPILLARY     Status: Abnormal   Collection Time    09/03/12  8:28 AM      Result  Value Range   Glucose-Capillary 318 (*) 70 - 99 mg/dL  GLUCOSE, CAPILLARY     Status: None   Collection Time    09/03/12 12:30 PM      Result Value Range   Glucose-Capillary 74  70 - 99 mg/dL  GLUCOSE, CAPILLARY     Status: Abnormal   Collection Time    09/03/12  5:25 PM      Result Value Range   Glucose-Capillary 222 (*) 70 - 99 mg/dL  GLUCOSE, CAPILLARY     Status: Abnormal   Collection Time    09/03/12  9:47 PM      Result Value Range   Glucose-Capillary 195 (*) 70 - 99 mg/dL  GLUCOSE, CAPILLARY     Status: Abnormal   Collection Time    09/04/12  7:37 AM      Result Value Range   Glucose-Capillary 51 (*) 70 - 99 mg/dL   Comment 1 Documented in Chart     Comment 2 Notify RN    RENAL FUNCTION PANEL     Status: Abnormal   Collection Time    09/04/12  8:08 AM      Result Value Range   Sodium 124 (*) 135 - 145 mEq/L   Comment: DELTA CHECK NOTED   Potassium 5.5 (*) 3.5 - 5.1 mEq/L   Chloride 89 (*) 96 - 112 mEq/L   CO2 23  19 - 32 mEq/L   Glucose, Bld 76  70 - 99 mg/dL   BUN 25 (*) 6 - 23 mg/dL   Creatinine, Ser 1.61 (*) 0.50 - 1.35 mg/dL   Calcium 8.5  8.4 - 09.6 mg/dL   Phosphorus 3.0  2.3 - 4.6 mg/dL   Albumin 3.2 (*) 3.5 - 5.2 g/dL   GFR calc non Af Amer 7 (*) >90 mL/min   GFR calc Af Amer 9 (*) >90 mL/min   Comment:            The eGFR has been calculated     using the CKD EPI equation.     This calculation has not been     validated in all clinical     situations.  eGFR's persistently     <90 mL/min signify     possible Chronic Kidney Disease.  GLUCOSE, CAPILLARY     Status: Abnormal   Collection Time    09/04/12  8:40 AM      Result Value Range   Glucose-Capillary 111 (*) 70 - 99 mg/dL   Comment 1 Documented in Chart     Comment 2 Notify RN    GLUCOSE, CAPILLARY     Status: Abnormal   Collection Time    09/04/12 11:26 AM      Result Value Range   Glucose-Capillary 129 (*) 70 - 99 mg/dL   Comment 1 Documented in Chart     Comment 2 Notify RN     MRSA PCR SCREENING     Status: None   Collection Time    09/04/12 12:27 PM      Result Value Range   MRSA by PCR NEGATIVE  NEGATIVE   Comment:            The GeneXpert MRSA Assay (FDA     approved for NASAL specimens     only), is one component of a     comprehensive MRSA colonization     surveillance program. It is not     intended to diagnose MRSA     infection nor to guide or     monitor treatment for     MRSA infections.    No results found.  Positive for anxiety, bad mood, behavior problems, depression, mood swings, sleep disturbance and chronic renal failure and multiple psychosocial stresses. Blood pressure 142/77, pulse 62, temperature 98.6 F (37 C), temperature source Oral, resp. rate 20, height 5\' 8"  (1.727 m), weight 63.776 kg (140 lb 9.6 oz), SpO2 97.00%.   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 Waiting for admission to acute psychiatric hospitalization for crisis management and medication management. 2. Patient is working with psychiatry social services for placement needs  3. Patient needed placement as a aftercare needs.  4. Patient is currently homeless living in car. 5. Appreciate psychiatric consultation  Krzysztof Reichelt,JANARDHAHA R. 09/04/2012, 2:46 PM

## 2012-09-04 NOTE — Progress Notes (Signed)
Pt currently refusing to go to dialysis. Says his right arm is too sore from the last time. I informed the pt that his arm could be numbed prior, and Dialysis RN came to pt's room and tried to talk pt into going to HD, but pt continues to refuse treatment. Pt says he may go this afternoon. MD notified.

## 2012-09-04 NOTE — Progress Notes (Signed)
Dr. Arlean Hopping called with k of 3.0 after running on a 1k bath for 2hrs. Pt has been on a 2k bath for 1hr 15 min. Pt plans on signing off in 18 min. Dr. Arlean Hopping stated to put pt on a 4k bath for the remaining of his tx

## 2012-09-04 NOTE — Progress Notes (Signed)
Hypoglycemic Event  CBG: 51  Treatment: Pt refused to take a snack, said he wanted to wait for breakfast. Pt ate a full breakfast, and then his CBG was rechecked.  Symptoms: Shaky  Follow-up CBG: Time: 8:40 CBG Result: 115  Possible Reasons for Event: Unknown  Comments/MD notified: MD notified    Toy Samarin L  Remember to initiate Hypoglycemia Order Set & complete

## 2012-09-04 NOTE — Progress Notes (Signed)
[facility] PROGRESS NOTE  Joe Vincent WUJ:[id number] DOB: Apr 09, 1960 DOA: 08/16/2012 PCP: [name], MD  Assessment/Plan: Principal Problem:   ESRD needing dialysis Active Problems:   Hypertension   Gastritis   CAD (coronary artery disease)   CKD (chronic kidney disease) stage 3, GFR 30-59 ml/min   Nausea and vomiting   Hyperkalemia   Hyponatremia   DM (diabetes mellitus)   Tobacco abuse   Suicide ideation      Brief narrative  52 year old white male with past medical history of end-stage renal disease on dialysis ( TTS) --inconsistent), DM,, hypertension and CAD who presented to [facility] emergency room on 7/17 with shortness of breath. Patient apparently had been discharged from or had hospital the day prior. On admission he reported he was admitted to [facility] hospital recently for suicidal ideation and that he was to be transferred to behavioral health . He has been depressed and feels rejected from his family and friends and feels he cannot go back to living in his car. He was admitted to the hospitalist service and found to be volume overloaded which was the cause of the shortness of breath. After receiving dialysis, his symptoms resolved. Since that time, he has been awaiting transfer to a psychiatric facility . He has received daily consultation by Tele-psychiatry, who each day stated that patient still felt to be suicidal. In order to provide better assessment and psych evaluation patient was transferred to [facility]    Assessment/Plan:  Suicidal ideation  Seen by psych. he has severe psychosocial issues and discharging him from hospital is unsafe as he has high likelyhood of harming himself. Patient currently suicidal as well. He recommends for inpatient behavior health for short term vs group home where he can be stable. SW and CM aware. He should be admitted to inpatient [facility] for better care of is current issue.  continue sitter.  continue celexa  Wants to  transfer to [facility],  ESRD  on HD . He seems to be irregular with his scheduled and shows up for HD whenever he feels like.  HD done on 7/31  DM  continue lantus and SSI , increase Lantus as Cbg uncontrolled  HTN  BP stable. continue amlodipine  CAD  stable  Tobacco abuse  counseled on cessation   Code Status: full code  Family Communication: none  Disposition Plan pending.needs placement at inpatient behavioral health facility . As per SW will be reevaluated by psych on 8/1 and determine disposition.   Consultants:  Psych  [place] [facility] Procedures:  None Antibiotics:  none   HPI/Subjective: Pt appears to be agitated and restless on assessment. Pt refused his 10mg  of valium at 10pm.  Concerned about his Dilaudid, verified with [facility] pharmacy in the [place] and the patient does take 4 mg of Dilaudid every 8 hours    Objective: Filed Vitals:   09/03/12 0455 09/03/12 1359 09/03/12 1410 09/03/12 2109  BP: 97/62 77/37 104/46 148/77  Pulse: 77 58 58 63  Temp: 99.2 F (37.3 C) 98 F (36.7 C)  98.7 F (37.1 C)  TempSrc: Oral Oral  Oral  Resp: 16 17  20   Height:    5\' 8"  (1.727 m)  Weight:    63.776 kg (140 lb 9.6 oz)  SpO2: 94% 100%  96%    Intake/Output Summary (Last 24 hours) at 09/04/12 0746 Last data filed at 09/04/12 0218  Gross per 24 hour  Intake   1060 ml  Output  0 ml  Net   1060 ml    Exam: General: Middle aged male in NAD  HEENT: no pallor , moist mucosa  Chest: clear b/l, no added sounds  CVS: NS1&S2, no murmurs  Abd: soft, NT, ND, BS+  Ext: warm, rt AV fistula  CNS: AAOX3       Data Reviewed: Basic Metabolic Panel:  Recent Labs Lab 08/29/12 0604 08/30/12 0501 08/31/12 0452 09/02/12 0825 09/03/12 0435  NA 131* 134* 133* 121* 136  K 5.0 5.5* 4.6 6.3* 5.1  CL 97 100 95* 89* 97  CO2 27 27 32 22 29  GLUCOSE 122* 68* 101* 72 101*  BUN 20 33* 18 40* 15  CREATININE 5.67* 7.68* 5.77* 9.22* 6.00*  CALCIUM 8.4 8.6 9.0 8.8  8.7  PHOS  --  3.2  --  3.6 3.6    Liver Function Tests:  Recent Labs Lab 09/02/12 0825 09/03/12 0435  ALBUMIN 3.4* 3.3*   No results found for this basename: LIPASE, AMYLASE,  in the last 168 hours No results found for this basename: AMMONIA,  in the last 168 hours  CBC:  Recent Labs Lab 09/02/12 0825  WBC 6.8  HGB 12.4*  HCT 36.0*  MCV 92.8  PLT 184    Cardiac Enzymes: No results found for this basename: CKTOTAL, CKMB, CKMBINDEX, TROPONINI,  in the last 168 hours BNP (last 3 results)  Recent Labs  08/17/12 0746  PROBNP 3324.0*     CBG:  Recent Labs Lab 09/02/12 2123 09/03/12 0828 09/03/12 1230 09/03/12 1725 09/03/12 2147  GLUCAP 171* 318* 74 222* 195*    No results found for this or any previous visit (from the past 240 hour(s)).   Studies: Dg Chest 2 View  08/17/2012   *RADIOLOGY REPORT*  Clinical Data: Nausea and hypertension.  CHEST - 2 VIEW  Comparison: 08/11/2012  Findings: Lungs hyperinflated and there may be a component of COPD. No infiltrate, edema, nodule or pleural effusion is identified. The heart size is within normal limits.  Stable calcified granuloma of the left upper lobe and associated calcified left hilar lymph nodes consistent with prior granulomatous disease.  IMPRESSION: Possible underlying COPD.  Evidence of prior granulomatous disease. No acute findings.   Original Report Authenticated By: Irish Lack, M.D.    Scheduled Meds: . b complex-vitamin c-folic acid  1 tablet Oral Daily  . citalopram  20 mg Oral Daily  . diazepam  10 mg Oral BID  . doxercalciferol  1 mcg Intravenous Q T,Th,Sa-HD  . heparin  5,000 Units Subcutaneous Q8H  . insulin aspart  0-15 Units Subcutaneous TID WC  . insulin glargine  15 Units Subcutaneous QHS  . nicotine  21 mg Transdermal Daily  . pantoprazole  40 mg Oral Daily  . sucralfate  1 g Oral TID WC & HS   Continuous Infusions:   Principal Problem:   ESRD needing dialysis Active Problems:    Hypertension   Gastritis   CAD (coronary artery disease)   CKD (chronic kidney disease) stage 3, GFR 30-59 ml/min   Nausea and vomiting   Hyperkalemia   Hyponatremia   DM (diabetes mellitus)   Tobacco abuse   Suicide ideation    Time spent: 40 minutes   [facility]  [facility] Pager [phone]. If 8PM-8AM, please contact night-coverage at [email or web address], password [facility] 09/04/2012, 7:46 AM  LOS: 19 days

## 2012-09-05 LAB — RENAL FUNCTION PANEL
Albumin: 2.9 g/dL — ABNORMAL LOW (ref 3.5–5.2)
BUN: 14 mg/dL (ref 6–23)
Calcium: 8.3 mg/dL — ABNORMAL LOW (ref 8.4–10.5)
Glucose, Bld: 203 mg/dL — ABNORMAL HIGH (ref 70–99)
Phosphorus: 2.9 mg/dL (ref 2.3–4.6)
Sodium: 134 mEq/L — ABNORMAL LOW (ref 135–145)

## 2012-09-05 LAB — GLUCOSE, CAPILLARY: Glucose-Capillary: 222 mg/dL — ABNORMAL HIGH (ref 70–99)

## 2012-09-05 MED ORDER — HYDROMORPHONE HCL 2 MG PO TABS
2.0000 mg | ORAL_TABLET | Freq: Three times a day (TID) | ORAL | Status: DC | PRN
  Administered 2012-09-05 – 2012-09-06 (×3): 4 mg via ORAL
  Filled 2012-09-05 (×3): qty 2

## 2012-09-05 MED ORDER — HYDROMORPHONE HCL 1 MG/ML PO LIQD: 2.0000 mg | Freq: Three times a day (TID) | ORAL | Status: DC | PRN

## 2012-09-05 NOTE — Progress Notes (Signed)
Pt declined at [facility] due to medical acuity.  [facility] rec finding BH facility housed within a medical hospital due to pt's medical issues.  CSW will continue bed search.

## 2012-09-05 NOTE — Progress Notes (Signed)
[facility] PROGRESS NOTE  Joe Vincent JYN:[id number] DOB: 05-07-1960 DOA: 08/16/2012 PCP: [name], MD  Assessment/Plan: Principal Problem:   ESRD needing dialysis Active Problems:   Hypertension   Gastritis   CAD (coronary artery disease)   CKD (chronic kidney disease) stage 3, GFR 30-59 ml/min   Nausea and vomiting   Hyperkalemia   Hyponatremia   DM (diabetes mellitus)   Tobacco abuse   Suicide ideation    Brief narrative  52 year old white male with past medical history of end-stage renal disease on dialysis ( TTS) --inconsistent), DM,, hypertension and CAD who presented to [facility] emergency room on 7/17 with shortness of breath. Patient apparently had been discharged from or had hospital the day prior. On admission he reported he was admitted to [facility] hospital recently for suicidal ideation and that he was to be transferred to behavioral health . He has been depressed and feels rejected from his family and friends and feels he cannot go back to living in his car. He was admitted to the hospitalist service and found to be volume overloaded which was the cause of the shortness of breath. After receiving dialysis, his symptoms resolved. Since that time, he has been awaiting transfer to a psychiatric facility . He has received daily consultation by Tele-psychiatry, who each day stated that patient still felt to be suicidal. In order to provide better assessment and psych evaluation patient was transferred to [facility].   Assessment/Plan:  Suicidal ideation  Seen by psych. he has severe psychosocial issues and discharging him from hospital is unsafe as he has high likelyhood of harming himself. Patient currently suicidal as well. He recommends for inpatient behavior health for short term vs group home where he can be stable. SW and CM aware. He should be admitted to inpatient [facility] for better care of is current issue.  continue sitter.  continue celexa  Wants to  transfer to [facility],   Chronic pain syndrome Patient on multiple narcotic medications, concern about overdose in the future the patient continues to have access to these prescriptions, patient notified that no prescriptions and be provided from the hospital at that time of discharge  ESRD  on HD . He seems to be irregular with his scheduled and shows up for HD whenever he feels like.  HD done on 7/31  Next dialysis 8/4  Hypokalemia renal aware    DM  continue lantus and SSI , increase Lantus as Cbg uncontrolled    HTN  BP stable. continue amlodipine  CAD  stable  Tobacco abuse  counseled on cessation   Code Status: full code  Family Communication: none  Disposition Plan pending.needs placement at inpatient behavioral health facility . As per SW will be reevaluated by psych on 8/1 and determine disposition.    Consultants:  Psych  [place] [facility] Procedures:  None Antibiotics:  none  HPI/Subjective:  Pt appears to be agitated and restless on assessment. Pt refused his 10mg  of valium at 10pm.  Concerned about his Dilaudid, verified with [facility] pharmacy [place] the [place] and the patient does take 4 mg of Dilaudid every 8 hours      Objective: Filed Vitals:   09/04/12 1909 09/04/12 1915 09/04/12 2025 09/05/12 0445  BP: 119/69 138/80 114/75 97/57  Pulse: 78 77 82 77  Temp: 98.7 F (37.1 C)  98.3 F (36.8 C) 98.5 F (36.9 C)  TempSrc: Oral  Oral Oral  Resp: 18 19 18 18   Height:  Weight: 62.3 kg (137 lb 5.6 oz)     SpO2: 100%  100% 100%    Intake/Output Summary (Last 24 hours) at 09/05/12 0810 Last data filed at 09/04/12 1909  Gross per 24 hour  Intake    120 ml  Output    127 ml  Net     -7 ml    Exam:  General: Middle aged male in NAD  HEENT: no pallor , moist mucosa  Chest: clear b/l, no added sounds  CVS: NS1&S2, no murmurs  Abd: soft, NT, ND, BS+  Ext: warm, rt AV fistula  CNS: AAOX3     Data Reviewed: Basic Metabolic Panel:  Recent  Labs Lab 08/30/12 0501 08/31/12 0452 09/02/12 0825 09/03/12 0435 09/04/12 0808 09/04/12 1729  NA 134* 133* 121* 136 124*  --   K 5.5* 4.6 6.3* 5.1 5.5* 3.0*  CL 100 95* 89* 97 89*  --   CO2 27 32 22 29 23   --   GLUCOSE 68* 101* 72 101* 76  --   BUN 33* 18 40* 15 25*  --   CREATININE 7.68* 5.77* 9.22* 6.00* 7.56*  --   CALCIUM 8.6 9.0 8.8 8.7 8.5  --   PHOS 3.2  --  3.6 3.6 3.0  --     Liver Function Tests:  Recent Labs Lab 09/02/12 0825 09/03/12 0435 09/04/12 0808  ALBUMIN 3.4* 3.3* 3.2*   No results found for this basename: LIPASE, AMYLASE,  in the last 168 hours No results found for this basename: AMMONIA,  in the last 168 hours  CBC:  Recent Labs Lab 09/02/12 0825  WBC 6.8  HGB 12.4*  HCT 36.0*  MCV 92.8  PLT 184    Cardiac Enzymes: No results found for this basename: CKTOTAL, CKMB, CKMBINDEX, TROPONINI,  in the last 168 hours BNP (last 3 results)  Recent Labs  08/17/12 0746  PROBNP 3324.0*     CBG:  Recent Labs Lab 09/04/12 0737 09/04/12 0840 09/04/12 1126 09/04/12 2029 09/05/12 0753  GLUCAP 51* 111* 129* 89 117*    Recent Results (from the past 240 hour(s))  MRSA PCR SCREENING     Status: None   Collection Time    09/04/12 12:27 PM      Result Value Range Status   MRSA by PCR NEGATIVE  NEGATIVE Final   Comment:            The GeneXpert MRSA Assay (FDA     approved for NASAL specimens     only), is one component of a     comprehensive MRSA colonization     surveillance program. It is not     intended to diagnose MRSA     infection nor to guide or     monitor treatment for     MRSA infections.     Studies: Dg Chest 2 View  08/17/2012   *RADIOLOGY REPORT*  Clinical Data: Nausea and hypertension.  CHEST - 2 VIEW  Comparison: 08/11/2012  Findings: Lungs hyperinflated and there may be a component of COPD. No infiltrate, edema, nodule or pleural effusion is identified. The heart size is within normal limits.  Stable calcified  granuloma of the left upper lobe and associated calcified left hilar lymph nodes consistent with prior granulomatous disease.  IMPRESSION: Possible underlying COPD.  Evidence of prior granulomatous disease. No acute findings.   Original Report Authenticated By: Irish Lack, M.D.    Scheduled Meds: . b complex-vitamin c-folic acid  1 tablet  Oral Daily  . citalopram  20 mg Oral Daily  . diazepam  10 mg Oral BID  . doxercalciferol  1 mcg Intravenous Q T,Th,Sa-HD  . heparin  5,000 Units Subcutaneous Q8H  . insulin aspart  0-15 Units Subcutaneous TID WC  . insulin glargine  15 Units Subcutaneous QHS  . nicotine  21 mg Transdermal Daily  . pantoprazole  40 mg Oral Daily  . sucralfate  1 g Oral TID WC & HS   Continuous Infusions:   Principal Problem:   ESRD needing dialysis Active Problems:   Hypertension   Gastritis   CAD (coronary artery disease)   CKD (chronic kidney disease) stage 3, GFR 30-59 ml/min   Nausea and vomiting   Hyperkalemia   Hyponatremia   DM (diabetes mellitus)   Tobacco abuse   Suicide ideation    Time spent: 40 minutes   [facility]  [facility] Pager [phone]. If 8PM-8AM, please contact night-coverage at [email or web address], password [facility] 09/05/2012, 8:10 AM  LOS: 20 days

## 2012-09-05 NOTE — Progress Notes (Addendum)
Subjective:  Dialyzed 3 1/2 of 4 hour prescribed treatment yesterday after initially refusing to dialyze. (Has h/o non-adherence in both inpt and outpt settings) Seen by Dr. Lolly Mustache (Psychiatry) by reiterates need for inpt treatment for depression with suicidal thinking Still no disposition  Says he is seeing whole pills come through in his ileostomy bag and wonders why he can't get IV pain medication rather than po  Objective Vital signs in last 24 hours: Filed Vitals:   09/04/12 1909 09/04/12 1915 09/04/12 2025 09/05/12 0445  BP: 119/69 138/80 114/75 97/57  Pulse: 78 77 82 77  Temp: 98.7 F (37.1 C)  98.3 F (36.8 C) 98.5 F (36.9 C)  TempSrc: Oral  Oral Oral  Resp: 18 19 18 18   Height:      Weight: 62.3 kg (137 lb 5.6 oz)     SpO2: 100%  100% 100%   Weight change: -2.276 kg (-5 lb 0.3 oz)  Intake/Output Summary (Last 24 hours) at 09/05/12 0731 Last data filed at 09/04/12 1909  Gross per 24 hour  Intake    120 ml  Output    127 ml  Net     -7 ml    Physical Exam:  BP 97/57  Pulse 77  Temp(Src) 98.5 F (36.9 C) (Oral)  Resp 18  Ht 5\' 8"  (1.727 m)  Wt 62.3 kg (137 lb 5.6 oz)  BMI 20.89 kg/m2  SpO2 100% Gen: NAD   Skin: Tattoos no rashes  Neck: no JVD, no bruits  Chest: Clear Heart: S1S2 no S3 or rub No murmur  Abdomen: soft, non tender Ostomy bag full of air Ext: no edema Right upper arm AVF well developed with good bruit and thrill/ some bruising Neuro: alert, Ox3, no focal deficit     Recent Labs Lab 08/30/12 0501 08/31/12 0452 09/02/12 0825 09/03/12 0435 09/04/12 0808 09/04/12 1729  NA 134* 133* 121* 136 124*  --   K 5.5* 4.6 6.3* 5.1 5.5* 3.0*  CL 100 95* 89* 97 89*  --   CO2 27 32 22 29 23   --   GLUCOSE 68* 101* 72 101* 76  --   BUN 33* 18 40* 15 25*  --   CREATININE 7.68* 5.77* 9.22* 6.00* 7.56*  --   CALCIUM 8.6 9.0 8.8 8.7 8.5  --   PHOS 3.2  --  3.6 3.6 3.0  --     Recent Labs Lab 09/02/12 0825 09/03/12 0435 09/04/12 0808  ALBUMIN  3.4* 3.3* 3.2*    Recent Labs Lab 09/02/12 0825  WBC 6.8  HGB 12.4*  HCT 36.0*  MCV 92.8  PLT 184    Recent Labs Lab 09/03/12 2147 09/04/12 0737 09/04/12 0840 09/04/12 1126 09/04/12 2029  GLUCAP 195* 51* 111* 129* 89    Studies/Results: No results found. Medications:   . b complex-vitamin c-folic acid  1 tablet Oral Daily  . citalopram  20 mg Oral Daily  . diazepam  10 mg Oral BID  . doxercalciferol  1 mcg Intravenous Q T,Th,Sa-HD  . heparin  5,000 Units Subcutaneous Q8H  . insulin aspart  0-15 Units Subcutaneous TID WC  . insulin glargine  15 Units Subcutaneous QHS  . nicotine  21 mg Transdermal Daily  . pantoprazole  40 mg Oral Daily  . sucralfate  1 g Oral TID WC & HS  acetaminophen, feeding supplement (NEPRO CARB STEADY), heparin, HYDROmorphone, ibuprofen, lidocaine (PF), lidocaine-prilocaine, ondansetron (ZOFRAN) IV, ondansetron, oxyCODONE, pentafluoroprop-tetrafluoroeth, zolpidem I  have reviewed scheduled and  prn medications.  Outpatient dialysis prescription is TTS, 4 hours, 2K2.5Ca, EDW 61.6 kg, 15 gu sharps, right upper arm AVF, 2000 units heparin, Hectorol 1 mcg TIW, no EPO.   ASSESSMENT/PLANS 52 yo WM with DM, HTN, ESRD on TTS HD ([facility]), depression with suicidal thoughts, transferred from [facility] for formal face to face psychiatric evaluation, and now awaiting placement in an inpt psychiatric facility.   ESRD  Outpt TTS - [facility] - Dr. Fausto Skillern Is below outpt EDW Had 3 1/2 of 4 hour prescribed TMT 8/2  Check AM labs (some issues with hyperkalemia despite appropriate diet) Next dialysis 8/4  Anemia  No ESA's needed  CKD-MBD  Ca/phos normal on no binders at present  No PTH data but is on TIW hectorol   Psych issues with reported suicidal thoughts  Awaiting placement at inpt psyche unit   H/O colon cancer with ileostomy Says "whole pills" coming through Wants IV pain meds Defer that to primary service  Camille Bal, MD [facility] [phone] pager 09/05/2012, 7:31 AM

## 2012-09-06 ENCOUNTER — Inpatient Hospital Stay (HOSPITAL_COMMUNITY)
Admission: AD | Admit: 2012-09-06 | Discharge: 2012-09-14 | DRG: 885 | Disposition: A | Discharge status: Home / Self Care | Payer: Medicare Other | Source: Intra-hospital | Attending: Psychiatry | Admitting: Psychiatry

## 2012-09-06 ENCOUNTER — Encounter (HOSPITAL_COMMUNITY): Payer: Self-pay | Admitting: Behavioral Health

## 2012-09-06 DIAGNOSIS — X838XXD Intentional self-harm by other specified means, subsequent encounter: Secondary | ICD-10-CM

## 2012-09-06 DIAGNOSIS — Z5900 Homelessness unspecified: Secondary | ICD-10-CM

## 2012-09-06 DIAGNOSIS — F332 Major depressive disorder, recurrent severe without psychotic features: Principal | ICD-10-CM

## 2012-09-06 DIAGNOSIS — Z720 Tobacco use: Secondary | ICD-10-CM

## 2012-09-06 DIAGNOSIS — F1010 Alcohol abuse, uncomplicated: Secondary | ICD-10-CM | POA: Diagnosis present

## 2012-09-06 DIAGNOSIS — E871 Hypo-osmolality and hyponatremia: Secondary | ICD-10-CM

## 2012-09-06 DIAGNOSIS — Z79899 Other long term (current) drug therapy: Secondary | ICD-10-CM

## 2012-09-06 DIAGNOSIS — K2970 Gastritis, unspecified, without bleeding: Secondary | ICD-10-CM

## 2012-09-06 DIAGNOSIS — I120 Hypertensive chronic kidney disease with stage 5 chronic kidney disease or end stage renal disease: Secondary | ICD-10-CM | POA: Diagnosis present

## 2012-09-06 DIAGNOSIS — X838XXA Intentional self-harm by other specified means, initial encounter: Secondary | ICD-10-CM

## 2012-09-06 DIAGNOSIS — I2510 Atherosclerotic heart disease of native coronary artery without angina pectoris: Secondary | ICD-10-CM

## 2012-09-06 DIAGNOSIS — Z992 Dependence on renal dialysis: Secondary | ICD-10-CM

## 2012-09-06 DIAGNOSIS — N186 End stage renal disease: Secondary | ICD-10-CM

## 2012-09-06 DIAGNOSIS — R079 Chest pain, unspecified: Secondary | ICD-10-CM

## 2012-09-06 DIAGNOSIS — I10 Essential (primary) hypertension: Secondary | ICD-10-CM

## 2012-09-06 DIAGNOSIS — E119 Type 2 diabetes mellitus without complications: Secondary | ICD-10-CM

## 2012-09-06 DIAGNOSIS — R45851 Suicidal ideations: Secondary | ICD-10-CM

## 2012-09-06 DIAGNOSIS — E875 Hyperkalemia: Secondary | ICD-10-CM

## 2012-09-06 DIAGNOSIS — R112 Nausea with vomiting, unspecified: Secondary | ICD-10-CM

## 2012-09-06 DIAGNOSIS — N183 Chronic kidney disease, stage 3 unspecified: Secondary | ICD-10-CM

## 2012-09-06 HISTORY — DX: Dependence on renal dialysis: Z99.2

## 2012-09-06 HISTORY — DX: Dependence on renal dialysis: N18.6

## 2012-09-06 LAB — COMPREHENSIVE METABOLIC PANEL
ALT: 34 U/L (ref 0–53)
AST: 21 U/L (ref 0–37)
Albumin: 2.8 g/dL — ABNORMAL LOW (ref 3.5–5.2)
Alkaline Phosphatase: 142 U/L — ABNORMAL HIGH (ref 39–117)
Chloride: 100 mEq/L (ref 96–112)
Potassium: 4.4 mEq/L (ref 3.5–5.1)
Sodium: 134 mEq/L — ABNORMAL LOW (ref 135–145)
Total Protein: 6.1 g/dL (ref 6.0–8.3)

## 2012-09-06 LAB — GLUCOSE, CAPILLARY
Glucose-Capillary: 83 mg/dL (ref 70–99)
Glucose-Capillary: 92 mg/dL (ref 70–99)

## 2012-09-06 LAB — PHOSPHORUS: Phosphorus: 2.3 mg/dL (ref 2.3–4.6)

## 2012-09-06 MED ORDER — CITALOPRAM HYDROBROMIDE 20 MG PO TABS: 20.0000 mg | ORAL_TABLET | Freq: Every day | ORAL | Status: DC

## 2012-09-06 MED ORDER — NEPRO/CARBSTEADY PO LIQD: 237.0000 mL | ORAL | Status: DC | PRN

## 2012-09-06 MED ORDER — HYDROMORPHONE HCL 4 MG PO TABS: 4.0000 mg | ORAL_TABLET | Freq: Three times a day (TID) | ORAL | Status: DC | PRN

## 2012-09-06 NOTE — Progress Notes (Signed)
Subjective:  No c/o from pt [facility] declined admission Had HD 8/2 No req  Meds: notable Citalopram Valium Hectoral with HD insulin  Physical Exam:  Blood pressure 109/59, pulse 65, temperature 98.5 F (36.9 C), temperature source Oral, resp. rate 18, height 5\' 8"  (1.727 m), weight 63.957 kg (141 lb), SpO2 100.00%.  NAD, sleeping RRR w/o mgr. Nl s1s2 Nl wob. CTAB S/nt/nd.   No LEE NO rashes Neuro nonfocal  ASSESSMENT/Plan 1. ESRD via RUE AVF TTS [facility]: Next HD tomorrow. Electrolyes, volume status ok.  Hb ok 2.  Acidosis: improved with NaHCO3 3. 2HPTH: phos and Ca acceptable.  On Hectoral with HD 4. SI: awaiting disposition  Sabra Heck MD 09/06/2012, 12:02 PM   Recent Labs Lab 09/04/12 0808 09/04/12 1729 09/05/12 0952 09/06/12 0505  NA 124*  --  134* 134*  K 5.5* 3.0* 4.6 4.4  CL 89*  --  99 100  CO2 23  --  25 25  GLUCOSE 76  --  203* 197*  BUN 25*  --  14 20  CREATININE 7.56*  --  5.21* 6.76*  CALCIUM 8.5  --  8.3* 8.2*  PHOS 3.0  --  2.9 2.3    Recent Labs Lab 09/02/12 0825  WBC 6.8  HGB 12.4*  HCT 36.0*  MCV 92.8  PLT 184
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
## 2012-09-06 NOTE — Treatment Plan (Signed)
Pt has been accepted by Dr. Lucianne Muss to [facility], to the services of Dr. Elsie Saas.  Once a bed is available [address] [place] will be notified.

## 2012-09-06 NOTE — Discharge Summary (Signed)
Physician Discharge Summary  Joe Vincent OZH:[id number] DOB: [birth year] DOA: 08/16/2012  PCP: [name], MD  Admit date: 08/16/2012 Discharge date: 09/06/2012  Time spent:40  minutes  Recommendations for Outpatient Follow-up:  1. D/c to [facility].  2. Patient will sent to [place] on Tu, Th and sat for HD . [facility] will be following closely   Discharge Diagnoses:  Principal Problem:   Suicide ideation  Active Problems: depression   Hypertension   Gastritis   CAD (coronary artery disease)   CKD (chronic kidney disease) stage 3, GFR 30-59 ml/min   Nausea and vomiting   ESRD needing dialysis   Hyperkalemia   Hyponatremia   DM (diabetes mellitus)   Tobacco abuse    Discharge Condition: fair  Diet recommendation: diabetic   Filed Weights   09/04/12 1515 09/04/12 1909 09/05/12 2047  Weight: 61.5 kg (135 lb 9.3 oz) 62.3 kg (137 lb 5.6 oz) 63.957 kg (141 lb)    History of present illness:  52 year old white male with past medical history of end-stage renal disease on dialysis ( TTS) --inconsistent), DM,, hypertension and CAD who presented to [facility] emergency room on 7/17 with shortness of breath. Patient apparently had been discharged from or had hospital the day prior. On admission he reported he was admitted to [facility] hospital recently for suicidal ideation and that he was to be transferred to behavioral health . He has been depressed and feels rejected from his family and friends and feels he cannot go back to living in his car. He was admitted to the hospitalist service and found to be volume overloaded which was the cause of the shortness of breath. After receiving dialysis, his symptoms resolved. Since that time, he has been awaiting transfer to a psychiatric facility . He has received daily consultation by Tele-psychiatry, who each day stated that patient still felt to be suicidal. In order to provide better assessment and psych evaluation patient was transferred to  [facility] Course:  Suicidal ideation  Seen by psych. he has severe psychosocial issues and discharging him from hospital is unsafe as he has high likelyhood of harming himself. Patient currently suicidal as well. He recommends for inpatient behavior health .  Accepted by [facility]. continue celexa   Chronic pain syndrome  Patient on multiple narcotic medications. After verifying from pharmacy , he is on dilaudid 4 mg tid   ESRD  on HD . He seems to be irregular with his scheduled and shows up for HD whenever he feels like.  Getting HD here . Wil bee coordinated to transport to [place] for scheduled HD. Renal aware of plan and will follow closely   DM  continue lantus and SSI. lantus dose adjusted   HTN  BP stable. continue amlodipine   CAD  stable   Tobacco abuse  counseled on cessation   Code Status: full code  Family Communication: none  Disposition Plan: d/c to [facility]  Consultants:  Psych  [facility]   Procedures:  None Antibiotics:  none      Discharge Exam: Filed Vitals:   09/06/12 0907  BP: 109/59  Pulse: 65  Temp: 98.5 F (36.9 C)  Resp: 18   General: 52-year-old male in NAD  HEENT: no pallor , moist mucosa  Chest: clear b/l, no added sounds  CVS: NS1&S2, no murmurs  Abd: soft, NT, ND, BS+  Ext: warm, rt AV fistula  CNS: AAOX3    Discharge Instructions  Medication List         amLODipine 10 MG tablet  Commonly known as:  NORVASC  Take 10 mg by mouth daily.     citalopram 20 MG tablet  Commonly known as:  CELEXA  Take 1 tablet (20 mg total) by mouth daily.     diazepam 10 MG tablet  Commonly known as:  VALIUM  Take 1 tablet (10 mg total) by mouth 2 (two) times daily.     feeding supplement (NEPRO CARB STEADY) Liqd  Take 237 mLs by mouth as needed (missed meal during dialysis.).     HYDROmorphone 4 MG tablet  Commonly known as:  DILAUDID  Take 1 tablet (4 mg total) by mouth 3 (three) times daily as  needed for pain.     insulin aspart 100 UNIT/ML injection  Commonly known as:  novoLOG  Inject 4-12 Units into the skin 3 (three) times daily as needed for high blood sugar (Uses a sliding scale at home).     insulin glargine 100 UNIT/ML injection  Commonly known as:  LANTUS  Inject 15 Units into the skin at bedtime.     multivitamin Tabs tablet  Take 1 tablet by mouth daily.     pantoprazole 40 MG tablet  Commonly known as:  PROTONIX  Take 1 tablet (40 mg total) by mouth daily.     sucralfate 1 G tablet  Commonly known as:  CARAFATE  Take 1 tablet (1 g total) by mouth 4 (four) times daily -  with meals and at bedtime.       Allergies  Allergen Reactions  . Codeine Swelling       Follow-up Information   Please follow up. (pt needs HD on Tu, Th and sat at [place])        The results of significant diagnostics from this hospitalization (including imaging, microbiology, ancillary and laboratory) are listed below for reference.    Significant Diagnostic Studies: Dg Chest 2 View  08/17/2012   *RADIOLOGY REPORT*  Clinical Data: Nausea and hypertension.  CHEST - 2 VIEW  Comparison: 08/11/2012  Findings: Lungs hyperinflated and there may be a component of COPD. No infiltrate, edema, nodule or pleural effusion is identified. The heart size is within normal limits.  Stable calcified granuloma of the left upper lobe and associated calcified left hilar lymph nodes consistent with prior granulomatous disease.  IMPRESSION: Possible underlying COPD.  Evidence of prior granulomatous disease. No acute findings.   Original Report Authenticated By: Irish Lack, M.D.    Microbiology: Recent Results (from the past 240 hour(s))  MRSA PCR SCREENING     Status: None   Collection Time    09/04/12 12:27 PM      Result Value Range Status   MRSA by PCR NEGATIVE  NEGATIVE Final   Comment:            The GeneXpert MRSA Assay (FDA     approved for NASAL specimens     only), is one  component of a     comprehensive MRSA colonization     surveillance program. It is not     intended to diagnose MRSA     infection nor to guide or     monitor treatment for     MRSA infections.     Labs: Basic Metabolic Panel:  Recent Labs Lab 09/02/12 0825 09/03/12 0435 09/04/12 0808 09/04/12 1729 09/05/12 0952 09/06/12 0505  NA 121* 136 124*  --  134* 134*  K 6.3* 5.1 5.5* 3.0* 4.6 4.4  CL 89* 97 89*  --  99 100  CO2 22 29 23   --  25 25  GLUCOSE 72 101* 76  --  203* 197*  BUN 40* 15 25*  --  14 20  CREATININE 9.22* 6.00* 7.56*  --  5.21* 6.76*  CALCIUM 8.8 8.7 8.5  --  8.3* 8.2*  PHOS 3.6 3.6 3.0  --  2.9 2.3   Liver Function Tests:  Recent Labs Lab 09/02/12 0825 09/03/12 0435 09/04/12 0808 09/05/12 0952 09/06/12 0505  AST  --   --   --   --  21  ALT  --   --   --   --  34  ALKPHOS  --   --   --   --  142*  BILITOT  --   --   --   --  0.2*  PROT  --   --   --   --  6.1  ALBUMIN 3.4* 3.3* 3.2* 2.9* 2.8*   No results found for this basename: LIPASE, AMYLASE,  in the last 168 hours No results found for this basename: AMMONIA,  in the last 168 hours CBC:  Recent Labs Lab 09/02/12 0825  WBC 6.8  HGB 12.4*  HCT 36.0*  MCV 92.8  PLT 184   Cardiac Enzymes: No results found for this basename: CKTOTAL, CKMB, CKMBINDEX, TROPONINI,  in the last 168 hours BNP: BNP (last 3 results)  Recent Labs  08/17/12 0746  PROBNP 3324.0*   CBG:  Recent Labs Lab 09/05/12 1134 09/05/12 1658 09/05/12 2046 09/06/12 0727 09/06/12 1207  GLUCAP 222* 154* 166* 136* 83       Signed:  Amaree Loisel  [facility] 09/06/2012, 4:55 PM

## 2012-09-06 NOTE — Progress Notes (Signed)
[facility] PROGRESS NOTE  Joe Vincent ZOX:[id number] DOB: Jun 23, 1960 DOA: 08/16/2012 PCP: [name], MD  Brief narrative  52 year old white male with past medical history of end-stage renal disease on dialysis ( TTS) --inconsistent), DM,, hypertension and CAD who presented to [facility] emergency room on 7/17 with shortness of breath. Patient apparently had been discharged from or had hospital the day prior. On admission he reported he was admitted to [facility] hospital recently for suicidal ideation and that he was to be transferred to behavioral health . He has been depressed and feels rejected from his family and friends and feels he cannot go back to living in his car. He was admitted to the hospitalist service and found to be volume overloaded which was the cause of the shortness of breath. After receiving dialysis, his symptoms resolved. Since that time, he has been awaiting transfer to a psychiatric facility . He has received daily consultation by Tele-psychiatry, who each day stated that patient still felt to be suicidal. In order to provide better assessment and psych evaluation patient was transferred to [facility].   Assessment/Plan:  Suicidal ideation  Seen by psych. he has severe psychosocial issues and discharging him from hospital is unsafe as he has high likelyhood of harming himself. Patient currently suicidal as well. He recommends for inpatient behavior health for short term vs group home where he can be stable. SW and CM aware. He should be admitted to inpatient [facility] for better care of is current issue.  continue sitter.  continue celexa  Have discussed with [facility] again. Patient will be reevaluated today if able to be transferred there. i have spoken with renal and agree that would adjust his timing of HD so that he can get un inturrupted group therapy  Chronic pain syndrome  Patient on multiple narcotic medications. After verifying from pharmacy , he is on dilaudid 4 mg  tid   ESRD  on HD . He seems to be irregular with his scheduled and shows up for HD whenever he feels like.  Getting HD here   DM  continue lantus and SSI. lantus dose adjusted  HTN  BP stable. continue amlodipine   CAD  stable   Tobacco abuse  counseled on cessation   Code Status: full code  Family Communication: none  Disposition Plan pending.needs placement at inpatient behavioral health facility  Patient to be reevaluated today from [facility]   Consultants:  Psych  [facility] Procedures:  None Antibiotics:  none    HPI/Subjective: Pt continues to feels suicidal off and on. Pain better controlled   Objective: Filed Vitals:   09/06/12 0907  BP: 109/59  Pulse: 65  Temp: 98.5 F (36.9 C)  Resp: 18    Intake/Output Summary (Last 24 hours) at 09/06/12 1632 Last data filed at 09/06/12 1313  Gross per 24 hour  Intake    720 ml  Output      1 ml  Net    719 ml   Filed Weights   09/04/12 1515 09/04/12 1909 09/05/12 2047  Weight: 61.5 kg (135 lb 9.3 oz) 62.3 kg (137 lb 5.6 oz) 63.957 kg (141 lb)    Exam:  General: Middle aged male in NAD  HEENT: no pallor , moist mucosa  Chest: clear b/l, no added sounds  CVS: NS1&S2, no murmurs  Abd: soft, NT, ND, BS+  Ext: warm, rt AV fistula  CNS: AAOX3   Data Reviewed: Basic Metabolic Panel:  Recent Labs Lab 09/02/12 0825 09/03/12  0454 09/04/12 0808 09/04/12 1729 09/05/12 0952 09/06/12 0505  NA 121* 136 124*  --  134* 134*  K 6.3* 5.1 5.5* 3.0* 4.6 4.4  CL 89* 97 89*  --  99 100  CO2 22 29 23   --  25 25  GLUCOSE 72 101* 76  --  203* 197*  BUN 40* 15 25*  --  14 20  CREATININE 9.22* 6.00* 7.56*  --  5.21* 6.76*  CALCIUM 8.8 8.7 8.5  --  8.3* 8.2*  PHOS 3.6 3.6 3.0  --  2.9 2.3   Liver Function Tests:  Recent Labs Lab 09/02/12 0825 09/03/12 0435 09/04/12 0808 09/05/12 0952 09/06/12 0505  AST  --   --   --   --  21  ALT  --   --   --   --  34  ALKPHOS  --   --   --   --  142*  BILITOT  --    --   --   --  0.2*  PROT  --   --   --   --  6.1  ALBUMIN 3.4* 3.3* 3.2* 2.9* 2.8*   No results found for this basename: LIPASE, AMYLASE,  in the last 168 hours No results found for this basename: AMMONIA,  in the last 168 hours CBC:  Recent Labs Lab 09/02/12 0825  WBC 6.8  HGB 12.4*  HCT 36.0*  MCV 92.8  PLT 184   Cardiac Enzymes: No results found for this basename: CKTOTAL, CKMB, CKMBINDEX, TROPONINI,  in the last 168 hours BNP (last 3 results)  Recent Labs  08/17/12 0746  PROBNP 3324.0*   CBG:  Recent Labs Lab 09/05/12 1134 09/05/12 1658 09/05/12 2046 09/06/12 0727 09/06/12 1207  GLUCAP 222* 154* 166* 136* 83    Recent Results (from the past 240 hour(s))  MRSA PCR SCREENING     Status: None   Collection Time    09/04/12 12:27 PM      Result Value Range Status   MRSA by PCR NEGATIVE  NEGATIVE Final   Comment:            The GeneXpert MRSA Assay (FDA     approved for NASAL specimens     only), is one component of a     comprehensive MRSA colonization     surveillance program. It is not     intended to diagnose MRSA     infection nor to guide or     monitor treatment for     MRSA infections.     Studies: No results found.  Scheduled Meds: . b complex-vitamin c-folic acid  1 tablet Oral Daily  . citalopram  20 mg Oral Daily  . diazepam  10 mg Oral BID  . doxercalciferol  1 mcg Intravenous Q T,Th,Sa-HD  . heparin  5,000 Units Subcutaneous Q8H  . insulin aspart  0-15 Units Subcutaneous TID WC  . insulin glargine  15 Units Subcutaneous QHS  . nicotine  21 mg Transdermal Daily  . pantoprazole  40 mg Oral Daily  . sucralfate  1 g Oral TID WC & HS   Continuous Infusions:     Time spent: 25 minutes    Elmyra Banwart  [facility] Pager [phone] If 7PM-7AM, please contact night-coverage at [email or web address], password [facility] 09/06/2012, 4:32 PM  LOS: 21 days

## 2012-09-07 DIAGNOSIS — F332 Major depressive disorder, recurrent severe without psychotic features: Secondary | ICD-10-CM

## 2012-09-07 LAB — CBC
HCT: 31.3 % — ABNORMAL LOW (ref 39.0–52.0)
Hemoglobin: 10.4 g/dL — ABNORMAL LOW (ref 13.0–17.0)
RBC: 3.34 MIL/uL — ABNORMAL LOW (ref 4.22–5.81)
WBC: 6.2 10*3/uL (ref 4.0–10.5)

## 2012-09-07 LAB — RENAL FUNCTION PANEL
Albumin: 3.2 g/dL — ABNORMAL LOW (ref 3.5–5.2)
BUN: 35 mg/dL — ABNORMAL HIGH (ref 6–23)
Chloride: 101 mEq/L (ref 96–112)
Glucose, Bld: 152 mg/dL — ABNORMAL HIGH (ref 70–99)
Potassium: 6 mEq/L — ABNORMAL HIGH (ref 3.5–5.1)

## 2012-09-07 LAB — GLUCOSE, CAPILLARY
Glucose-Capillary: 156 mg/dL — ABNORMAL HIGH (ref 70–99)
Glucose-Capillary: 180 mg/dL — ABNORMAL HIGH (ref 70–99)

## 2012-09-07 MED ORDER — RENA-VITE PO TABS
1.0000 | ORAL_TABLET | Freq: Every day | ORAL | Status: DC
  Administered 2012-09-07 – 2012-09-14 (×7): 1 via ORAL
  Filled 2012-09-07 (×10): qty 1

## 2012-09-07 MED ORDER — ACETAMINOPHEN 325 MG PO TABS: 650.0000 mg | ORAL_TABLET | Freq: Four times a day (QID) | ORAL | Status: DC | PRN

## 2012-09-07 MED ORDER — ZOLPIDEM TARTRATE 10 MG PO TABS
10.0000 mg | ORAL_TABLET | Freq: Every evening | ORAL | Status: DC | PRN
  Administered 2012-09-07 – 2012-09-08 (×2): 10 mg via ORAL
  Filled 2012-09-07 (×2): qty 1

## 2012-09-07 MED ORDER — MAGNESIUM HYDROXIDE 400 MG/5ML PO SUSP: 30.0000 mL | Freq: Every day | ORAL | Status: DC | PRN

## 2012-09-07 MED ORDER — ONDANSETRON 4 MG PO TBDP: 4.0000 mg | ORAL_TABLET | Freq: Four times a day (QID) | ORAL | Status: DC | PRN

## 2012-09-07 MED ORDER — IBUPROFEN 600 MG PO TABS: 600.0000 mg | ORAL_TABLET | Freq: Three times a day (TID) | ORAL | Status: DC | PRN

## 2012-09-07 MED ORDER — PENTAFLUOROPROP-TETRAFLUOROETH EX AERO
1.0000 "application " | INHALATION_SPRAY | CUTANEOUS | Status: DC | PRN
  Filled 2012-09-07: qty 103.5

## 2012-09-07 MED ORDER — INSULIN GLARGINE 100 UNIT/ML ~~LOC~~ SOLN
15.0000 [IU] | Freq: Every day | SUBCUTANEOUS | Status: DC
  Administered 2012-09-07 – 2012-09-13 (×7): 15 [IU] via SUBCUTANEOUS
  Filled 2012-09-07: qty 0.15

## 2012-09-07 MED ORDER — LIDOCAINE-PRILOCAINE 2.5-2.5 % EX CREA: 1.0000 "application " | TOPICAL_CREAM | CUTANEOUS | Status: DC | PRN

## 2012-09-07 MED ORDER — CITALOPRAM HYDROBROMIDE 20 MG PO TABS
20.0000 mg | ORAL_TABLET | Freq: Every day | ORAL | Status: DC
  Administered 2012-09-07: 20 mg via ORAL
  Filled 2012-09-07 (×2): qty 1

## 2012-09-07 MED ORDER — PANTOPRAZOLE SODIUM 40 MG PO TBEC
40.0000 mg | DELAYED_RELEASE_TABLET | Freq: Every day | ORAL | Status: DC
  Administered 2012-09-07 – 2012-09-14 (×7): 40 mg via ORAL
  Filled 2012-09-07 (×11): qty 1

## 2012-09-07 MED ORDER — ALUM & MAG HYDROXIDE-SIMETH 200-200-20 MG/5ML PO SUSP: 30.0000 mL | ORAL | Status: DC | PRN

## 2012-09-07 MED ORDER — DIAZEPAM 5 MG PO TABS
10.0000 mg | ORAL_TABLET | Freq: Two times a day (BID) | ORAL | Status: DC
  Administered 2012-09-07 – 2012-09-14 (×14): 10 mg via ORAL
  Filled 2012-09-07 (×4): qty 2
  Filled 2012-09-07: qty 1
  Filled 2012-09-07 (×3): qty 2
  Filled 2012-09-07: qty 1
  Filled 2012-09-07 (×6): qty 2

## 2012-09-07 MED ORDER — HEPARIN SODIUM (PORCINE) 1000 UNIT/ML DIALYSIS
2000.0000 [IU] | INTRAMUSCULAR | Status: DC | PRN
  Administered 2012-09-07: 2000 [IU] via INTRAVENOUS_CENTRAL
  Filled 2012-09-07: qty 2

## 2012-09-07 MED ORDER — HEPARIN SODIUM (PORCINE) 1000 UNIT/ML DIALYSIS
1000.0000 [IU] | INTRAMUSCULAR | Status: DC | PRN
  Filled 2012-09-07: qty 1

## 2012-09-07 MED ORDER — ALTEPLASE 2 MG IJ SOLR
2.0000 mg | Freq: Once | INTRAMUSCULAR | Status: AC | PRN
  Filled 2012-09-07: qty 2

## 2012-09-07 MED ORDER — DOXERCALCIFEROL 4 MCG/2ML IV SOLN
1.0000 ug | INTRAVENOUS | Status: DC
  Administered 2012-09-07 – 2012-09-09 (×2): 1 ug via INTRAVENOUS
  Filled 2012-09-07 (×4): qty 2

## 2012-09-07 MED ORDER — SODIUM CHLORIDE 0.9 % IV SOLN
100.0000 mL | INTRAVENOUS | Status: DC | PRN
  Filled 2012-09-07: qty 100

## 2012-09-07 MED ORDER — INSULIN ASPART 100 UNIT/ML ~~LOC~~ SOLN
0.0000 [IU] | Freq: Three times a day (TID) | SUBCUTANEOUS | Status: DC
  Administered 2012-09-07: 2 [IU] via SUBCUTANEOUS
  Administered 2012-09-08: 8 [IU] via SUBCUTANEOUS
  Administered 2012-09-08: 3 [IU] via SUBCUTANEOUS
  Administered 2012-09-09: 5 [IU] via SUBCUTANEOUS
  Administered 2012-09-09: 2 [IU] via SUBCUTANEOUS
  Administered 2012-09-10: 5 [IU] via SUBCUTANEOUS
  Administered 2012-09-10 – 2012-09-13 (×5): 3 [IU] via SUBCUTANEOUS
  Administered 2012-09-13: 2 [IU] via SUBCUTANEOUS
  Administered 2012-09-13: 3 [IU] via SUBCUTANEOUS

## 2012-09-07 MED ORDER — SUCRALFATE 1 G PO TABS
1.0000 g | ORAL_TABLET | Freq: Three times a day (TID) | ORAL | Status: DC
  Administered 2012-09-07 – 2012-09-08 (×6): 1 g via ORAL
  Filled 2012-09-07 (×17): qty 1

## 2012-09-07 MED ORDER — FLUOXETINE HCL 10 MG PO CAPS
10.0000 mg | ORAL_CAPSULE | Freq: Every day | ORAL | Status: DC
  Administered 2012-09-07 – 2012-09-08 (×2): 10 mg via ORAL
  Filled 2012-09-07 (×5): qty 1

## 2012-09-07 MED ORDER — LIDOCAINE HCL (PF) 1 % IJ SOLN
5.0000 mL | INTRAMUSCULAR | Status: DC | PRN
  Filled 2012-09-07: qty 5

## 2012-09-07 MED ORDER — NEPRO/CARBSTEADY PO LIQD
237.0000 mL | ORAL | Status: DC | PRN
  Filled 2012-09-07: qty 237

## 2012-09-07 MED ORDER — HYDROMORPHONE HCL 2 MG PO TABS
2.0000 mg | ORAL_TABLET | Freq: Three times a day (TID) | ORAL | Status: DC | PRN
  Administered 2012-09-07 – 2012-09-10 (×8): 4 mg via ORAL
  Administered 2012-09-10: 2 mg via ORAL
  Administered 2012-09-10 – 2012-09-12 (×3): 4 mg via ORAL
  Administered 2012-09-12: 2 mg via ORAL
  Administered 2012-09-12 – 2012-09-14 (×4): 4 mg via ORAL
  Filled 2012-09-07 (×12): qty 2
  Filled 2012-09-07: qty 1
  Filled 2012-09-07 (×4): qty 2

## 2012-09-07 MED ORDER — HEPARIN SODIUM (PORCINE) 5000 UNIT/ML IJ SOLN
5000.0000 [IU] | Freq: Three times a day (TID) | INTRAMUSCULAR | Status: DC
  Filled 2012-09-07: qty 1

## 2012-09-07 NOTE — Progress Notes (Signed)
Patient attended 0900 hr RN orientation group.The focus of this group is to educate the patient on the purpose and policies of crisis stabilization and provide a format to answer questions about their admission.  The group details unit policies and expectations of patients while admitted. Patient actively participated and forward little information at this time.

## 2012-09-07 NOTE — Progress Notes (Signed)
Spoke with Dr. Allena Katz after being told to page him by nurse practitioner about dialysis orders ; physician was informed of situation and nurse will follow up

## 2012-09-07 NOTE — Progress Notes (Addendum)
Writer called hemodialysis at 0600am [place] this AM to try to get patient scheduled on the dialysis schedule.  Nephrology consult ordered.  Writer spoke to [place] with instructions.  [place] notified.  Will follow up.

## 2012-09-07 NOTE — Progress Notes (Signed)
Spoke with the [employer]; To be assured that patient is on their list to see for hemodialysis; was told we must wait on the orders from Dr. Allena Katz and there is no definite time to be given of when; time called was 0813 hrs; nurse will continue to follow

## 2012-09-07 NOTE — Procedures (Signed)
Patient seen on Hemodialysis.  QB 400, UF goal 4500 Treatment adjusted as needed.  Zetta Bills MD [facility]. Office # [phone] Pager # [phone] 12:27 PM
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
## 2012-09-07 NOTE — [facility] Suicide Risk Assessment (Signed)
Suicide Risk Assessment  Admission Assessment     Nursing information obtained from:  Patient Demographic factors:  Male;Caucasian;Divorced or widowed;Low socioeconomic status;Unemployed;Living alone Current Mental Status:  Suicidal ideation indicated by patient;Self-harm thoughts Loss Factors:  Decline in physical health;Legal issues;Financial problems / change in socioeconomic status Historical Factors:  Family history of mental illness or substance abuse Risk Reduction Factors:  Religious beliefs about death  CLINICAL FACTORS:   Severe Anxiety and/or Agitation Depression:   Anhedonia Hopelessness Impulsivity Insomnia Recent sense of peace/wellbeing Severe Alcohol/Substance Abuse/Dependencies Unstable or Poor Therapeutic Relationship Previous Psychiatric Diagnoses and Treatments Medical Diagnoses and Treatments/Surgeries  COGNITIVE FEATURES THAT CONTRIBUTE TO RISK:  Closed-mindedness Polarized thinking    SUICIDE RISK:   Mild:  Suicidal ideation of limited frequency, intensity, duration, and specificity.  There are no identifiable plans, no associated intent, mild dysphoria and related symptoms, good self-control (both objective and subjective assessment), few other risk factors, and identifiable protective factors, including available and accessible social support.  PLAN OF CARE: This is a voluntary admission for depression and suicidal ideation with plan of overdose on his medication. He needs hemodialysis three times a week  I certify that inpatient services furnished can reasonably be expected to improve the patient's condition.   Nehemiah Settle., MD 09/07/2012, 5:35 PM
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
## 2012-09-07 NOTE — Progress Notes (Signed)
D: Patient in his room in bed talking to his roommate on approach.  Patient appears brighter today.  Patient states he is unsure of his plan.  Patient states he is living hour by hour, day by day.  Patient states he does not know what is next for him.  Patient stated he was tired from going to dialysis today.  Patient states he is passive SI but verbally contracts for safety.  Patient denies HI and denies AVH. A: Staff to monitor Q 15 mins for safety.  Encouragement and support offered.  Scheduled medications administered per orders.  Ambien administered prn for sleep. R: Patient remains safe on the unit.  Patient attended group tonight.  Patient calm, cooperative and taking administered medications.  Patient visible on the unit and interacting with peers.

## 2012-09-07 NOTE — Tx Team (Signed)
Initial Interdisciplinary Treatment Plan  PATIENT STRENGTHS: (choose at least two) Ability for insight Communication skills Financial means General fund of knowledge Religious Affiliation  PATIENT STRESSORS: Financial difficulties Health problems Legal issue Marital or family conflict Traumatic event   PROBLEM LIST: Problem List/Patient Goals Date to be addressed Date deferred Reason deferred Estimated date of resolution  " I wont hurt myself here but if I go back out there I will hurt myself." 09/06/2012     "I am depressed because I just cannot win due to my health and my situation." 09/06/2012                                                DISCHARGE CRITERIA:  Ability to meet basic life and health needs Adequate post-discharge living arrangements Improved stabilization in mood, thinking, and/or behavior Need for constant or close observation no longer present Reduction of life-threatening or endangering symptoms to within safe limits  PRELIMINARY DISCHARGE PLAN: Attend aftercare/continuing care group Participate in family therapy Placement in alternative living arrangements  PATIENT/FAMIILY INVOLVEMENT: This treatment plan has been presented to and reviewed with the patient, Joe Vincent.  The patient and family have been given the opportunity to ask questions and make suggestions.  Angeline Slim M 09/07/2012, 1:05 AM

## 2012-09-07 NOTE — Progress Notes (Signed)
D: Patient denies HI and A/V hallucinations; patient admits to some thoughts of SI; patient reports sleep is poor; reports appetite is improving ; reports energy level is low ; reports ability to pay attention is improving; rates depression as 8/10; rates hopelessness 8/10;  A: Monitored q 15 minutes; patient encouraged to attend groups; patient educated about medications; patient given medications per physician orders; patient encouraged to express feelings and/or concerns  R: Patient is pleasant but sad; patient is depressed and he talks about his medical issues and how they are worsening; patient's interaction with staff and peers is appropriate; patient was able to set goal to talk with staff 1:1 when having feelings of SI; patient is taking medications as prescribed and tolerating medications; patient is attending all groups and forwarding little information

## 2012-09-07 NOTE — Progress Notes (Signed)
Adult Psychoeducational Group Note  Date:  09/07/2012 Time:  9:36 PM  Group Topic/Focus:  Wrap-Up Group:   The focus of this group is to help patients review their daily goal of treatment and discuss progress on daily workbooks.  Participation Level:  Active  Participation Quality:  Sharing and Supportive  Affect:  Flat  Cognitive:  Alert, Appropriate and Oriented  Insight: Appropriate and Good  Engagement in Group:  Engaged and Supportive  Modes of Intervention:  Support  Additional Comments:  Pt had a lot of insight; stated that he was very happy to be in an 'open environment' to discuss his problems.   Humberto Seals Monique 09/07/2012, 9:36 PM

## 2012-09-08 DIAGNOSIS — E871 Hypo-osmolality and hyponatremia: Secondary | ICD-10-CM

## 2012-09-08 DIAGNOSIS — E875 Hyperkalemia: Secondary | ICD-10-CM

## 2012-09-08 DIAGNOSIS — N186 End stage renal disease: Secondary | ICD-10-CM

## 2012-09-08 DIAGNOSIS — E119 Type 2 diabetes mellitus without complications: Secondary | ICD-10-CM

## 2012-09-08 LAB — GLUCOSE, CAPILLARY
Glucose-Capillary: 260 mg/dL — ABNORMAL HIGH (ref 70–99)
Glucose-Capillary: 161 mg/dL — ABNORMAL HIGH (ref 70–99)

## 2012-09-08 LAB — BASIC METABOLIC PANEL
Chloride: 99 mEq/L (ref 96–112)
Creatinine, Ser: 7.44 mg/dL — ABNORMAL HIGH (ref 0.50–1.35)
GFR calc Af Amer: 9 mL/min — ABNORMAL LOW (ref >90)
Potassium: 5.9 mEq/L — ABNORMAL HIGH (ref 3.5–5.1)

## 2012-09-08 MED ORDER — FLUOXETINE HCL 20 MG PO CAPS
20.0000 mg | ORAL_CAPSULE | Freq: Every day | ORAL | Status: DC
  Administered 2012-09-09: 20 mg via ORAL
  Filled 2012-09-08 (×3): qty 1

## 2012-09-08 MED ORDER — SODIUM POLYSTYRENE SULFONATE 15 GM/60ML PO SUSP: 15.0000 g | Freq: Once | ORAL | Status: DC

## 2012-09-08 MED ORDER — SODIUM BICARBONATE 650 MG PO TABS
650.0000 mg | ORAL_TABLET | Freq: Two times a day (BID) | ORAL | Status: DC
  Administered 2012-09-08 – 2012-09-14 (×11): 650 mg via ORAL
  Filled 2012-09-08 (×16): qty 1

## 2012-09-08 MED ORDER — SODIUM POLYSTYRENE SULFONATE 15 GM/60ML PO SUSP
15.0000 g | Freq: Once | ORAL | Status: DC
  Filled 2012-09-08: qty 60
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
## 2012-09-08 NOTE — Progress Notes (Signed)
[facility] MD Progress Note  09/08/2012 12:52 PM Joe Vincent  MRN:  [id number] Subjective:  Patient has been suffering with a major depressive disorder with a suicidal ideation with intention and plan of overdose on medications. Patient is also suffering with the chronic renal failure and required hemodialysis 3 times a week, Tuesday Thursday and Saturday which will be done by Dr. Allena Katz group at [facility].    Patient has rated depression 6 or 7/10 and same with anxiety. Patient continued to endorse suicidal ideation and states no changes since been admitted. He Has poor sleep last night and has poor appetite because of nausea. We'll contact him internal medicine consult for managing chronic renal failure while in the hospital. Patient need to be closely observed his interactions, socialization and at participation in the unit and documented appropriatly due to patient has a less motivation to be homeless. Patient has been homeless and living in his own car and needed replacement at the time of discharge/disposition.  Diagnosis:  Axis I: Major Depression, Recurrent severe  ADL's:  Impaired  Sleep: Poor  Appetite:  Poor  Suicidal Ideation:  Patient has been suicidal with a plan of overdose on medications and has a recent suicide attempt Homicidal Ideation:  Denied AEB (as evidenced by):  Psychiatric Specialty Exam: ROS  Blood pressure 91/52, pulse 72, temperature 98.7 F (37.1 C), temperature source Oral, resp. rate 16, height 5' 9.25" (1.759 m), weight 62.4 kg (137 lb 9.1 oz), SpO2 100.00%.Body mass index is 20.17 kg/(m^2).  General Appearance: Disheveled and Guarded  Eye Solicitor::  Fair  Speech:  Clear and Coherent  Volume:  Normal  Mood:  Anxious, Depressed, Hopeless and Worthless  Affect:  Constricted and Depressed  Thought Process:  Goal Directed and Intact  Orientation:  Full (Time, Place, and Person)  Thought Content:  Rumination  Suicidal Thoughts:  Yes.  with  intent/plan  Homicidal Thoughts:  No  Memory:  Immediate;   Fair  Judgement:  Impaired  Insight:  Lacking  Psychomotor Activity:  Decreased and Psychomotor Retardation  Concentration:  Fair  Recall:  Fair  Akathisia:  NA  Handed:  Right  AIMS (if indicated):     Assets:  Communication Skills Desire for Improvement Leisure Time Resilience  Sleep:  Number of Hours: 6.25   Current Medications: Current Facility-Administered Medications  Medication Dose Route Frequency Provider Last Rate Last Dose  . 0.9 %  sodium chloride infusion  100 mL Intravenous PRN Jay K. Allena Katz, MD      . 0.9 %  sodium chloride infusion  100 mL Intravenous PRN Vonna Kotyk K. Allena Katz, MD      . acetaminophen (TYLENOL) tablet 650 mg  650 mg Oral Q6H PRN Nelly Rout, MD      . diazepam (VALIUM) tablet 10 mg  10 mg Oral BID Nelly Rout, MD   10 mg at 09/08/12 0865  . doxercalciferol (HECTOROL) injection 1 mcg  1 mcg Intravenous Q T,Th,Sa-HD Jay K. Allena Katz, MD   1 mcg at 09/07/12 1249  . feeding supplement (NEPRO CARB STEADY) liquid 237 mL  237 mL Oral PRN Nelly Rout, MD      . FLUoxetine (PROZAC) capsule 10 mg  10 mg Oral Daily Nehemiah Settle, MD   10 mg at 09/08/12 0826  . heparin injection 1,000 Units  1,000 Units Dialysis PRN Hartley Barefoot. Allena Katz, MD      . heparin injection 2,000 Units  2,000 Units Dialysis PRN Hartley Barefoot. Allena Katz, MD  2,000 Units at 09/07/12 1239  . HYDROmorphone (DILAUDID) tablet 2-4 mg  2-4 mg Oral TID PRN Nelly Rout, MD   4 mg at 09/08/12 0826  . ibuprofen (ADVIL,MOTRIN) tablet 600 mg  600 mg Oral Q8H PRN Nelly Rout, MD      . insulin aspart (novoLOG) injection 0-15 Units  0-15 Units Subcutaneous TID WC Nelly Rout, MD   3 Units at 09/08/12 1207  . insulin glargine (LANTUS) injection 15 Units  15 Units Subcutaneous QHS Nelly Rout, MD   15 Units at 09/07/12 2239  . lidocaine (PF) (XYLOCAINE) 1 % injection 5 mL  5 mL Intradermal PRN Jay K. Allena Katz, MD      . lidocaine-prilocaine (EMLA) cream 1  application  1 application Topical PRN Vonna Kotyk K. Allena Katz, MD      . multivitamin (RENA-VIT) tablet 1 tablet  1 tablet Oral Daily Nelly Rout, MD   1 tablet at 09/08/12 [phone]  . ondansetron (ZOFRAN-ODT) disintegrating tablet 4 mg  4 mg Oral Q6H PRN Nelly Rout, MD      . pantoprazole (PROTONIX) EC tablet 40 mg  40 mg Oral Daily Nelly Rout, MD   40 mg at 09/08/12 9604  . pentafluoroprop-tetrafluoroeth (GEBAUERS) aerosol 1 application  1 application Topical PRN Jay K. Allena Katz, MD      . sodium bicarbonate tablet 650 mg  650 mg Oral BID Marinda Elk, MD      . sodium polystyrene (KAYEXALATE) 15 GM/60ML suspension 15 g  15 g Oral Once Marinda Elk, MD      . sucralfate (CARAFATE) tablet 1 g  1 g Oral TID WC & HS Nelly Rout, MD   1 g at 09/08/12 1204  . zolpidem (AMBIEN) tablet 10 mg  10 mg Oral QHS PRN Nelly Rout, MD   10 mg at 09/07/12 2238    Lab Results:  Results for orders placed during the hospital encounter of 09/06/12 (from the past 48 hour(s))  GLUCOSE, CAPILLARY     Status: Abnormal   Collection Time    09/06/12 11:46 PM      Result Value Range   Glucose-Capillary 105 (*) 70 - 99 mg/dL  GLUCOSE, CAPILLARY     Status: None   Collection Time    09/07/12  6:24 AM      Result Value Range   Glucose-Capillary 79  70 - 99 mg/dL  CBC     Status: Abnormal   Collection Time    09/07/12 12:24 PM      Result Value Range   WBC 6.2  4.0 - 10.5 K/uL   RBC 3.34 (*) 4.22 - 5.81 MIL/uL   Hemoglobin 10.4 (*) 13.0 - 17.0 g/dL   HCT 54.0 (*) 98.1 - 19.1 %   MCV 93.7  78.0 - 100.0 fL   MCH 31.1  26.0 - 34.0 pg   MCHC 33.2  30.0 - 36.0 g/dL   RDW 47.8  29.5 - 62.1 %   Platelets 142 (*) 150 - 400 K/uL  RENAL FUNCTION PANEL     Status: Abnormal   Collection Time    09/07/12  1:00 PM      Result Value Range   Sodium 129 (*) 135 - 145 mEq/L   Potassium 6.0 (*) 3.5 - 5.1 mEq/L   Comment: DELTA CHECK NOTED     NO VISIBLE HEMOLYSIS   Chloride 101  96 - 112 mEq/L   CO2 18 (*) 19  - 32 mEq/L   Glucose, Bld  152 (*) 70 - 99 mg/dL   BUN 35 (*) 6 - 23 mg/dL   Comment: DELTA CHECK NOTED   Creatinine, Ser 8.67 (*) 0.50 - 1.35 mg/dL   Calcium 8.4  8.4 - 65.7 mg/dL   Phosphorus 2.2 (*) 2.3 - 4.6 mg/dL   Albumin 3.2 (*) 3.5 - 5.2 g/dL   GFR calc non Af Amer 6 (*) >90 mL/min   GFR calc Af Amer 7 (*) >90 mL/min   Comment:            The eGFR has been calculated     using the CKD EPI equation.     This calculation has not been     validated in all clinical     situations.     eGFR's persistently     <90 mL/min signify     possible Chronic Kidney Disease.  GLUCOSE, CAPILLARY     Status: Abnormal   Collection Time    09/07/12  4:55 PM      Result Value Range   Glucose-Capillary 122 (*) 70 - 99 mg/dL   Comment 1 Notify RN    GLUCOSE, CAPILLARY     Status: Abnormal   Collection Time    09/07/12  9:10 PM      Result Value Range   Glucose-Capillary 180 (*) 70 - 99 mg/dL  GLUCOSE, CAPILLARY     Status: None   Collection Time    09/08/12  6:03 AM      Result Value Range   Glucose-Capillary 75  70 - 99 mg/dL   Comment 1 Notify RN    GLUCOSE, CAPILLARY     Status: Abnormal   Collection Time    09/08/12 11:55 AM      Result Value Range   Glucose-Capillary 179 (*) 70 - 99 mg/dL   Comment 1 Notify RN      Physical Findings: AIMS:  , ,  ,  ,    CIWA:    COWS:     Treatment Plan Summary: Daily contact with patient to assess and evaluate symptoms and progress in treatment Medication management  Plan: Increase fluoxetine to 20 mg a day for depression and anxiety Request hospitalist consult for chronic renal failure Continue hemodialysis 3 times a week as scheduled 1. Continue for crisis management and stabilization. 2. Medication management to reduce current symptoms to base line and improve the patient's overall level of functioning. 3. Treat health problems as indicated. 4. Develop treatment plan to decrease risk of relapse upon discharge and to reduce the  need for readmission. 5. Psycho-social education regarding relapse prevention and self care. 6. Health care follow up as needed for medical problems. 7. Restart home medications where appropriate. 8. Case discussed with treatment team and case manager will be working with patient regarding placement     Medical Decision Making Problem Points:  Established problem, worsening (2), Review of last therapy session (1) and Review of psycho-social stressors (1) Data Points:  Review or order clinical lab tests (1) Review or order medicine tests (1) Review of medication regiment & side effects (2) Review of new medications or change in dosage (2)  I certify that inpatient services furnished can reasonably be expected to improve the patient's condition.   Nehemiah Settle., M.D. 09/08/2012, 12:52 PM

## 2012-09-08 NOTE — [facility] Group Notes (Signed)
[facility] Group Notes:  (Nursing/MHT/Case Management/Adjunct)  Date:  09/08/2012  Time:  11:00 AM  Type of Therapy:  Nurse Education  Participation Level:  Did Not Attend  Participation Quality:  did not attend  Affect:  did not attend  Cognitive:    Insight:    Engagement in Group:    Modes of Intervention:    Summary of Progress/Problems:  Cresenciano Lick 09/08/2012, 11:00 AM

## 2012-09-08 NOTE — Progress Notes (Signed)
Patient ID: Joe Vincent, male   DOB: February 28, 1960, 52 y.o.   MRN: [id number] PER STATE REGULATIONS 482.30  THIS CHART WAS REVIEWED FOR MEDICAL NECESSITY WITH RESPECT TO THE PATIENT'S ADMISSION/ DURATION OF STAY.  NEXT REVIEW DATE: 09/12/2012  Willa Rough, RN, BSN CASE MANAGER

## 2012-09-08 NOTE — Progress Notes (Signed)
Psychoeducational Group Note  Date:  09/08/2012 Time:  2000  Group Topic/Focus:  Wrap-Up Group:   The focus of this group is to help patients review their daily goal of treatment and discuss progress on daily workbooks.  Participation Level: Did Not Attend  Participation Quality:  Not Applicable  Affect:  Not Applicable  Cognitive:  Not Applicable  Insight:  Not Applicable  Engagement in Group: Not Applicable  Additional Comments:    KIDUS, DELMAN 09/08/2012, 9:21 PM

## 2012-09-08 NOTE — [facility] Group Notes (Signed)
[facility] LCSW Aftercare Discharge Planning Group Note   09/08/2012 8:45 AM  Participation Quality:  Alert and Appropriate   Mood/Affect:  Appropriate, Flat and Depressed  Depression Rating:  8-9  Anxiety Rating:  8-9  Thoughts of Suicide:  Pt endorses SI, denies HI  Will you contract for safety?   Yes  Current AVH: Pt denies  Plan for Discharge/Comments:  Pt attended discharge planning group and actively participated in group.  CSW provided pt with today's workbook.  Pt states that he doesn't know what he will do upon d/c.  Pt states that he was living in his car in [place] and could return to his car.  Pt states that he could follow up at [facility] for medication management and therapy.  CSW will assess for placement options.  No further needs voiced by pt at this time.    Transportation Means: Pt reports access to transportation  Supports: No supports mentioned at this time  Reyes Ivan, LCSWA 09/08/2012 9:36 AM

## 2012-09-08 NOTE — [facility] Group Notes (Signed)
[facility] LCSW Group Therapy  Emotional Regulation 1:15 - 2: 30 PM        09/08/2012   Type of Therapy:  Group Therapy  Participation Level:  Minimal  Participation Quality:  Appropriate  Affect:  Appropriate  Cognitive:  Attentive Appropriate  Insight:  Developing/Improving  Engagement in Therapy: Developing/Improving  Modes of Intervention:  Discussion Exploration Problem-Solving Supportive  Summary of Progress/Problems:  Group topic was emotional regulations.  Patient listened attentive but did not participate in the discussion.  Wynn Banker 09/08/2012

## 2012-09-08 NOTE — Tx Team (Signed)
Interdisciplinary Treatment Plan Update (Adult)  Date: 09/08/2012  Time Reviewed:  9:45 AM  Progress in Treatment: Attending groups: Yes Participating in groups:  Yes Taking medication as prescribed:  Yes Tolerating medication:  Yes Family/Significant othe contact made: CSW assessing  Patient understands diagnosis:  Yes Discussing patient identified problems/goals with staff:  Yes Medical problems stabilized or resolved:  Yes Denies suicidal/homicidal ideation: Yes Issues/concerns per patient self-inventory:  Yes Other:  New problem(s) identified: N/A  Discharge Plan or Barriers: CSW assessing for appropriate referrals.  Pt can follow up at [facility] for medication management and therapy.  Pt is homeless.  CSW will assess for placement.    Reason for Continuation of Hospitalization: Anxiety Depression Medication Stabilization  Comments: N/A  Estimated length of stay: 3-5 days  For review of initial/current patient goals, please see plan of care.  Attendees: Patient:     Family:     Physician:  Dr. Javier Glazier 09/08/2012 10:04 AM   Nursing:   Burnetta Sabin, RN 09/08/2012 10:04 AM   Clinical Social Worker:  Reyes Ivan, LCSWA 09/08/2012 10:04 AM   Other: Verne Spurr, PA 09/08/2012 10:04 AM   Other:  Lowella Grip, RN 09/08/2012 10:04 AM   Other:  Juline Patch, LCSW 09/08/2012 10:04 AM   Other:     Other:    Other:    Other:    Other:    Other:    Other:     Scribe for Treatment Team:   Carmina Miller, 09/08/2012 10:04 AM

## 2012-09-08 NOTE — Progress Notes (Signed)
The nurse notified me that patient had refused to take the Kayexalate which was ordered earlier. So I had ordered a repeat metabolic panel and potassium was 5.9. EKG does not show any new changes. As per the nurse patient was not in respiratory distress or did not complain of any shortness of breath. I did discuss with on-call nephrologist Dr. Briant Cedar and dialysis will be arranged for tomorrow first thing in the morning.  Midge Minium

## 2012-09-08 NOTE — Progress Notes (Signed)
Adult Psychoeducational Group Note  Date:  09/08/2012 Time:  5:19 PM  Group Topic/Focus:  Personal Choices and Values:   The focus of this group is to help patients assess and explore the importance of values in their lives, how their values affect their decisions, how they express their values and what opposes their expression.  Participation Level:  Active  Participation Quality:  Appropriate, Attentive and Sharing  Affect:  Appropriate  Cognitive:  Appropriate  Insight: Appropriate and Good  Engagement in Group:  Engaged  Modes of Intervention:  Discussion, Education and Socialization  Additional Comments:  Kean attended and participated during group. Patient stated negative and positive values and choices that patient have been made throughout life span. Patient completed identifying values and value oriented life worksheets during group shared how to improve values and explained values that are important to patient.  Karleen Hampshire Brittini 09/08/2012, 5:19 PM

## 2012-09-08 NOTE — Clinical Social Work Note (Signed)
CSW met with pt individually at this time to discuss placement.  Pt states that he needs housing but at this time is not sure he is open to giving up his check.  Pt asks how he will be able to save money if he has to give his check up.  Pt is open to thinking about it.  CSW to check on pt's thoughts on this later this week.  Pt does have Medicare and Medicaid so resources are there.  No further needs voiced by pt at this time.    Joe Vincent, LCSWA 09/08/2012  11:12 AM

## 2012-09-08 NOTE — Progress Notes (Signed)
It was reported to Clinical research associate that patient refused his Kayexalate today at noon because patient states it makes him vomit.  Reporting nurse stated she did not notify MD that patient refused.  Writer notified Donell Sievert PA @2015  patient refused his kayexalate today at 12pm.  Writer instructed to call internal medicine.  Writer/RN notified Dr. Toniann Fail  With [facility] new orders received.  Patient had ekg and was transported to [facility] for STAT lab work @2130  and and patient was back by 2155. Dr. Toniann Fail aware of lab results and EKG was faxed to him due to EKG machine not transmitting results in a timely manner. Dr. Toniann Fail notified writer that after consulting with nephrology patient is to go to dialysis first thing in the AM.  Writer called hemodialysis unit at [facility] around 2300 and was notified by [place] that patient is scheduled to come to dialysis but writer will have to call back at 6am for early morning appointment to speak with Mingo Amber.  Will follow up at 6am.

## 2012-09-08 NOTE — Progress Notes (Signed)
Recreation Therapy Notes  Date: 08.06.2014 Time: 3:00pm Location: [address] Dayroom  Group Topic: Communication, Team Building, Problem Solving  Goal Area(s) Addresses:  Patient will effectively work in groups to accomplish shared goal. Patient will verbalize skills used in to make activity successful. Patient will verbalize benefit of using skills in a healthy way. Patient will relate skills used during group session to personal development.   Behavioral Response: Engaged, Tourist information centre manager  Intervention: Team Activity  Activity: Stage manager. Patients were divided into two groups. Patients were given 12 drinking straws and a length of masking tape. Using supplies provided patients were asked to build a freestanding structure to catch a plastic golf ball dropped from approximately 4 feet.   Education: Customer service manager, Discharge Planning, Relapse Prevention  Education Outcome: Acknowledges Understanding.   Clinical Observations/Feedback: Patient attended group session, actively participated in group activity, but made no contributions to opening or wrap up discussion. Patient took a control of the project, building it without getting input from other group members. Patient was reminded of instructions and time limitation various times, however he failed to build a structure that would catch the ball. Patient explained what he wanted to accomplish, but was not able to execute. Patient looked at a book during wrap up discussion, patient did not appear to be actively listening to points raised by group members.   Marykay Lex Lekesha Claw, LRT/CTRS  Jearl Klinefelter 09/08/2012 4:38 PM

## 2012-09-08 NOTE — Consult Note (Signed)
[facility] Medical Consultation  Joe Vincent ZOX:[id number] DOB: 12/14/1960 DOA: 09/06/2012 PCP: [name], MD   Requesting physician: Jennette Kettle Date of consultation: 8.6.2014 Reason for consultation: electrolytes imbalance  Impression/Recommendations: Hyperkalemia - This is most likely secondary to his end-stage renal disease, we'll get a EKG to monitor his intervals any EKG changes. Give one dose of Kayexalate check a basic metabolic panel in the morning. As his potassium 6.0 may continue to increase. He is going for dialysis tomorrow which should help with his potassium. He is not receiving any exogenous potassium and his diet is fluid restricted. He does have a little bit of acidosis. And he is not on bicarbonate tablets we'll start him on oral bicarbonate. - Further management per renal.  ESRD needing dialysis: - Prerenal hemodialysis tomorrow.  Hyponatremia: - This most likely can make his incision renal disease. - No further changes.   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 Was seen by a nephrologist almost dialyzed 2 days prior to consult. We'll consulted on the day of admission for electrolyte imbalance.  Review of Systems:  Constitutional:  No weight loss, night sweats, Fevers, chills, fatigue.  HEENT:  No headaches, Difficulty swallowing,Tooth/dental problems,Sore throat,  No sneezing, itching, ear ache, nasal congestion, post nasal drip,  Cardio-vascular:  No chest pain, Orthopnea, PND, swelling in lower extremities, anasarca, dizziness, palpitations  GI:  No heartburn, indigestion, abdominal pain, nausea, vomiting, diarrhea, change in bowel habits, loss of appetite  Resp:  No shortness of breath with exertion or at rest. No excess mucus, no productive cough, No non-productive cough, No coughing  up of blood.No change in color of mucus.No wheezing.No chest wall deformity  Skin:  no rash or lesions.  GU:  no dysuria, change in color of urine, no urgency or frequency. No flank pain.  Musculoskeletal:  No joint pain or swelling. No decreased range of motion. No back pain.  Psych:  No change in mood or affect. No depression or anxiety. No memory loss.     Past Medical History  Diagnosis Date  . Diabetes mellitus   . Hypertension   . GERD (gastroesophageal reflux disease)   . Retinopathy   . Peripheral neuropathy   . Chronic kidney disease   . Chest pain     a. 05/2011 Cath: nonobs dzs.  . Myocardial infarction 2000  . Depression   . Liver disease   . Cancer     Colon   Past Surgical History  Procedure Laterality Date  . Colostomy  Ileostomy    colon cancer  . Amputation      left pinky toe  . Esophagogastroduodenoscopy  05/23/2011    Procedure: ESOPHAGOGASTRODUODENOSCOPY (EGD);  Surgeon: Theda Belfast, MD;  Location: [facility] ENDOSCOPY;  Service: Endoscopy;  Laterality: N/A;  . Colon surgery  2009  . Cardiac catheterization  2012  . Eye surgery     Social History:  reports that he has been smoking Cigarettes and Cigars.  He has a 7.5 pack-year smoking history. He has never used smokeless tobacco. He reports that he does not drink alcohol or use illicit drugs.  Allergies  Allergen Reactions  . Codeine Swelling and Rash   Family History  Problem Relation Age of Onset  . Cancer Mother   . Heart disease Father   . Cancer Father     Prior to Admission medications  Medication Sig Start Date End Date Taking? Authorizing Provider  amLODipine (NORVASC) 10 MG tablet Take 10 mg by mouth daily.   Yes Historical Provider, MD  citalopram (CELEXA) 20 MG tablet Take 1 tablet (20 mg total) by mouth daily. 09/06/12  Yes Nishant Dhungel, MD  diazepam (VALIUM) 10 MG tablet Take 10 mg by mouth 2 (two) times daily. 08/19/12  Yes Erick Blinks, MD  HYDROmorphone (DILAUDID) 4 MG tablet  Take 1 tablet (4 mg total) by mouth 3 (three) times daily as needed for pain. 09/06/12  Yes Nishant Dhungel, MD  insulin aspart (NOVOLOG) 100 UNIT/ML injection Inject 4-12 Units into the skin 3 (three) times daily as needed for high blood sugar (Uses a sliding scale at home).   Yes Historical Provider, MD  insulin glargine (LANTUS) 100 UNIT/ML injection Inject 15 Units into the skin at bedtime.    Yes Historical Provider, MD  multivitamin (RENA-VIT) TABS tablet Take 1 tablet by mouth daily.   Yes Historical Provider, MD  pantoprazole (PROTONIX) 40 MG tablet Take 40 mg by mouth daily. 08/19/12  Yes Erick Blinks, MD  sucralfate (CARAFATE) 1 G tablet Take 1 g by mouth 4 (four) times daily -  with meals and at bedtime. 08/19/12  Yes Erick Blinks, MD   Physical Exam: Blood pressure 89/53, pulse 92, temperature 98.7 F (37.1 C), temperature source Oral, resp. rate 16, height 5' 9.25" (1.759 m), weight 62.4 kg (137 lb 9.1 oz), SpO2 100.00%. Filed Vitals:   09/08/12 0710  BP: 89/53  Pulse: 92  Temp:   Resp:     BP 89/53  Pulse 92  Temp(Src) 98.7 F (37.1 C) (Oral)  Resp 16  Ht 5' 9.25" (1.759 m)  Wt 62.4 kg (137 lb 9.1 oz)  BMI 20.17 kg/m2  SpO2 100%  General Appearance:    Alert, cooperative, no distress, appears stated age, cachectic appearing   Head:    Normocephalic, without obvious abnormality, atraumatic           Throat:   missing multiple teeth   Neck:   Supple, symmetrical, trachea midline, no adenopathy;       thyroid:  No enlargement/tenderness/nodules; no carotid   bruit or JVD  Back:     Symmetric, no curvature, ROM normal, no CVA tenderness  Lungs:     Clear to auscultation bilaterally, respirations unlabored  Chest wall:    No tenderness or deformity  Heart:    Regular rate and rhythm, S1 and S2 normal, no murmur, rub   or gallop  Abdomen:     Soft, non-tender, bowel sounds active all four quadrants,    no masses, no organomegaly        Extremities:   Extremities  normal, atraumatic, no cyanosis or edema  Pulses:   2+ and symmetric all extremities  Skin:   Skin color, texture, turgor normal, no rashes or lesions  Lymph nodes:   Cervical, supraclavicular, and axillary nodes normal  Neurologic:   CNII-XII intact. Normal strength, sensation and reflexes      throughout     Labs on Admission:  Basic Metabolic Panel:  Recent Labs Lab 09/03/12 0435 09/04/12 0808 09/04/12 1729 09/05/12 0952 09/06/12 0505 09/07/12 1300  NA 136 124*  --  134* 134* 129*  K 5.1 5.5* 3.0* 4.6 4.4 6.0*  CL 97 89*  --  99 100 101  CO2 29 23  --  25 25 18*  GLUCOSE 101* 76  --  203* 197* 152*  BUN 15 25*  --  14 20 35*  CREATININE 6.00* 7.56*  --  5.21* 6.76* 8.67*  CALCIUM 8.7 8.5  --  8.3* 8.2* 8.4  PHOS 3.6 3.0  --  2.9 2.3 2.2*   Liver Function Tests:  Recent Labs Lab 09/03/12 0435 09/04/12 0808 09/05/12 0952 09/06/12 0505 09/07/12 1300  AST  --   --   --  21  --   ALT  --   --   --  34  --   ALKPHOS  --   --   --  142*  --   BILITOT  --   --   --  0.2*  --   PROT  --   --   --  6.1  --   ALBUMIN 3.3* 3.2* 2.9* 2.8* 3.2*   No results found for this basename: LIPASE, AMYLASE,  in the last 168 hours No results found for this basename: AMMONIA,  in the last 168 hours CBC:  Recent Labs Lab 09/02/12 0825 09/07/12 1224  WBC 6.8 6.2  HGB 12.4* 10.4*  HCT 36.0* 31.3*  MCV 92.8 93.7  PLT 184 142*   Cardiac Enzymes: No results found for this basename: CKTOTAL, CKMB, CKMBINDEX, TROPONINI,  in the last 168 hours BNP: No components found with this basename: POCBNP,  CBG:  Recent Labs Lab 09/06/12 2346 09/07/12 0624 09/07/12 2110 09/08/12 0603 09/08/12 1155  GLUCAP 105* 79 180* 75 179*    Radiological Exams on Admission: No results found.  EKG: Independently reviewed. First-degree AV block left anterior fascicular block normal T wave changes. Time spent: 60 minutes  Marinda Elk [facility] Pager [phone]  If 7PM-7AM,  please contact night-coverage [email or web address] Password [facility] 09/08/2012, 12:04 PM

## 2012-09-08 NOTE — Progress Notes (Signed)
D- Patient is put on unit interacting with peers.  He is irritable at times stating frustrations with his general medical care and "every doctor tells me something different".  States "I want to get my mind straight" when ask about goals for treatment.  Confirms SI with no plan and contracts for safety.  Rates depression at 8 and hopelessness at 9. Chronic pain treated with prn medications per pt. Request.  A-Support and encouragement given.  Ongoing dx and medication education offered.  Continue POC and 15' checks for safety.  R- Pt has refused scheduled sodium polystyrene.  Safety maintained.

## 2012-09-08 NOTE — Progress Notes (Signed)
D: Patient in bed on first approach today.  Patient states he has felt down today and he stated he does not feel well.  Patient states he is worried about where he will go from here.  Patient states he is worried that his potassium level has continue to be high despite him having dialysis.  Patient states he has passive SI patient verbally contracts for safety.  Patient denies HI and denies AVH  A: Staff to monitor Q 15 mins for safety.  Encouragement and support offered.  Scheduled medications administered per orders.  Ambien administered prn for sleep. R: Patient remains safe on the unit.  Patient did not attend group tonight.  Patient cooperative and taking administered medications.  Patient visible on the unit and interacting with peers.

## 2012-09-09 LAB — BASIC METABOLIC PANEL
CO2: 23 mEq/L (ref 19–32)
Chloride: 99 mEq/L (ref 96–112)
Sodium: 129 mEq/L — ABNORMAL LOW (ref 135–145)

## 2012-09-09 LAB — CBC
MCH: 31.7 pg (ref 26.0–34.0)
Platelets: 143 10*3/uL — ABNORMAL LOW (ref 150–400)
RBC: 3.44 MIL/uL — ABNORMAL LOW (ref 4.22–5.81)
RDW: 14.9 % (ref 11.5–15.5)
WBC: 6.5 10*3/uL (ref 4.0–10.5)

## 2012-09-09 LAB — GLUCOSE, CAPILLARY
Glucose-Capillary: 202 mg/dL — ABNORMAL HIGH (ref 70–99)
Glucose-Capillary: 137 mg/dL — ABNORMAL HIGH (ref 70–99)
Glucose-Capillary: 183 mg/dL — ABNORMAL HIGH (ref 70–99)

## 2012-09-09 MED ORDER — SUCRALFATE 1 G PO TABS
1.0000 g | ORAL_TABLET | Freq: Three times a day (TID) | ORAL | Status: DC
  Administered 2012-09-09 – 2012-09-14 (×13): 1 g via ORAL
  Filled 2012-09-09 (×20): qty 1

## 2012-09-09 MED ORDER — TRAZODONE HCL 50 MG PO TABS
50.0000 mg | ORAL_TABLET | Freq: Every day | ORAL | Status: DC
  Administered 2012-09-09: 50 mg via ORAL
  Filled 2012-09-09 (×2): qty 1

## 2012-09-09 MED ORDER — HEPARIN SODIUM (PORCINE) 1000 UNIT/ML DIALYSIS
2000.0000 [IU] | INTRAMUSCULAR | Status: DC | PRN
  Filled 2012-09-09: qty 2

## 2012-09-09 MED ORDER — FLUOXETINE HCL 20 MG PO CAPS
40.0000 mg | ORAL_CAPSULE | Freq: Every day | ORAL | Status: DC
  Administered 2012-09-10 – 2012-09-14 (×5): 40 mg via ORAL
  Filled 2012-09-09 (×4): qty 2
  Filled 2012-09-09: qty 6
  Filled 2012-09-09: qty 2
  Filled 2012-09-09: qty 6

## 2012-09-09 NOTE — Progress Notes (Signed)
Report called to [facility] RN Hemodialysis unit at [facility].  Patient to be transported to [facility] around 7am for a 730 appointment.

## 2012-09-09 NOTE — Procedures (Signed)
Patient seen on Hemodialysis. Voices no complaints, concern raised last evening for hyperkalemia- using a 1K bath for 2hr and 2K bath for 2hr- hopefully he will stay the entire 4 hours and not sign off at 3 1/2 hr as he did on Tuesday. BP 109/68, QB 400, UF goal 2.5L Treatment adjusted as needed.  Zetta Bills MD [facility]. Office # [phone] Pager # [phone] 10:09 AM

## 2012-09-09 NOTE — Progress Notes (Signed)
D:  Patient went to dialysis this morning.  Came back around lunch time.  Stated he was too tired to attend group and wanted to take pain medicine and lie down.  He did get up and go into the day room as soon as group was over with.  Complained of pain "all over" and requested hydromorphone.  He was given 4 mg and stated he is on 8 mg at home.  He also stated that if the MD discontinued his pain medicine he would be calling a Clinical research associate.  Discussed with Dr. Shela Commons who states that the patient is on what he is going to get at this time.  Patient is requesting either an increase in his pain medication or as change to oxycodone.  He endorses constant suicidal ideation, but is up in the dayroom and interacting with peers.  He rates depression at 9 and hopelessness at 10.  He also states his pain level is 9, but he is up walking and joking with peers.  A:  Medications given when patient returned from dialysis.  Given hydromorphone 4 mg orally once on return from dialysis.    R:  Interacting well with peers.  Polite with staff, but irritable at times.  Safety is maintained with 15 minute checks.

## 2012-09-09 NOTE — Progress Notes (Signed)
Hyperkalemia : patient had dialysis today. hyperkalemia corrected with  dialysis. . Continue with renal diet. Will sign of. Please call with questions.  Joe Hanover, Md.

## 2012-09-09 NOTE — Progress Notes (Signed)
D   Pt did not attend group this evening    He did request to be able to stay in the dayroom and watch tv   He is pleasant on approach but can be somewhat irritable and depressed and argumentative   He denies suicidal ideation and contracts for safety should he begin to feel like he is suicidal A   Verbal support given   Medications administered and effectiveness monitored   Q 15 min checks R   Pt safe at present

## 2012-09-09 NOTE — Progress Notes (Signed)
Adult Psychoeducational Group Note  Date:  09/09/2012 Time:  11:00AM Group Topic/Focus:  Self Esteem Action Plan:   The focus of this group is to help patients create a plan to continue to build self-esteem after discharge.  Participation Level:  Did Not Attend   Additional Comments: Pt. Didn't attend group.   Bing Plume D 09/09/2012, 11:32 AM

## 2012-09-09 NOTE — Progress Notes (Signed)
Call from lab to report a critical value of 6.4 potassium.  Caller was requested to call the dialysis unit and notify them of the result.  Verne Spurr, PA was also notified by me.

## 2012-09-09 NOTE — Progress Notes (Signed)
Patient did not attend 0900 orientation nurse group because patient was at dialysis.

## 2012-09-09 NOTE — Progress Notes (Signed)
Patient rested this afternoon and did not attend recreational activities.  Patient did go to dining room for dinner tonight.  Patient's 1700 CBG was 137, Novolog 2 units given patient left arm sq per MD orders.  Patient has been laughing and talking to peers/staff.

## 2012-09-09 NOTE — [facility] Suicide Risk Assessment (Signed)
[facility] INPATIENT:  Family/Significant Other Suicide Prevention Education  Suicide Prevention Education:  Contact Attempts: [name], Hassan Buckler [phone] has been identified by the patient as the family member/significant other with whom the patient will be residing, and identified as the person(s) who will aid the patient in the event of a mental health crisis.  With written consent from the patient, two attempts were made to provide suicide prevention education, prior to and/or following the patient's discharge.  We were unsuccessful in providing suicide prevention education.  A suicide education pamphlet was given to the patient to share with family/significant other.  Date and time of first attempt: 09/09/12 at 9:00 AM Date and time of second attempt:  09/10/12 at 11:30 AM  Joe Vincent, Joesph July 09/09/2012, 8:28 AM

## 2012-09-09 NOTE — [facility] Group Notes (Signed)
[facility] LCSW Group Therapy  [facility] 1:15 - 2:30 PM  09/09/2012   Type of Therapy:  Group Therapy  Participation Level:  Patient entered groups as it was ending.  Wynn Banker 09/09/2012

## 2012-09-09 NOTE — Progress Notes (Signed)
Patient ID: Joe Vincent, male   DOB: 1960-04-19, 52 y.o.   MRN: [id number] [facility] MD Progress Note  09/09/2012 1:26 PM Roddy Berns  MRN:  [id number]  Subjective:  Patient has completed his scheduled hemodialysis and return and stated feeling tired. He continues to endorse his depression even though has no depressive affect or behaviors. Patients stated that he has chronic pain and his pain medication is working but not completely. He said he will asks his pain doctor to adjust it. Patient stated that he was told he will be allowed to stay in [facility] for about a week. He has completed three days. He continue to endorse depressive disorder with a suicidal ideation with intention and plan of overdose on medications. Patient has been participating on the unit when he was not tired. His depression 7/10 and ith anxiety 6/10. Patient states no changes in his depression and anxiety since admitted. He has poor sleep last night. Appreciate Internal medicine consult. Patient need to be closely observed his interactions, socialization and participation in the unit and documented appropriatly due to patient has a less motivation to be discharged because homelessness. Patient has been homeless and living in his own car and needed replacement at the time of discharge/disposition.  Diagnosis:  Axis I: Major Depression, Recurrent severe  ADL's:  Impaired  Sleep: Poor  Appetite:  Poor  Suicidal Ideation:  Patient has been suicidal with a plan of overdose on medications and has a recent suicide attempt Homicidal Ideation:  Denied AEB (as evidenced by):  Psychiatric Specialty Exam: ROS  Blood pressure 115/77, pulse 70, temperature 98 F (36.7 C), temperature source Oral, resp. rate 16, height 5' 9.25" (1.759 m), weight 62.4 kg (137 lb 9.1 oz), SpO2 98.00%.Body mass index is 20.17 kg/(m^2).  General Appearance: Disheveled and Guarded  Eye Solicitor::  Fair  Speech:  Clear and Coherent  Volume:  Normal  Mood:  Anxious,  Depressed, Hopeless and Worthless  Affect:  Constricted and Depressed  Thought Process:  Goal Directed and Intact  Orientation:  Full (Time, Place, and Person)  Thought Content:  Rumination  Suicidal Thoughts:  Yes.  with intent/plan  Homicidal Thoughts:  No  Memory:  Immediate;   Fair  Judgement:  Impaired  Insight:  Lacking  Psychomotor Activity:  Decreased and Psychomotor Retardation  Concentration:  Fair  Recall:  Fair  Akathisia:  NA  Handed:  Right  AIMS (if indicated):     Assets:  Communication Skills Desire for Improvement Leisure Time Resilience  Sleep:  Number of Hours: 6   Current Medications: Current Facility-Administered Medications  Medication Dose Route Frequency Provider Last Rate Last Dose  . 0.9 %  sodium chloride infusion  100 mL Intravenous PRN Jay K. Allena Katz, MD      . 0.9 %  sodium chloride infusion  100 mL Intravenous PRN Vonna Kotyk K. Allena Katz, MD      . acetaminophen (TYLENOL) tablet 650 mg  650 mg Oral Q6H PRN Nelly Rout, MD      . diazepam (VALIUM) tablet 10 mg  10 mg Oral BID Nelly Rout, MD   10 mg at 09/08/12 1708  . doxercalciferol (HECTOROL) injection 1 mcg  1 mcg Intravenous Q T,Th,Sa-HD Jay K. Allena Katz, MD   1 mcg at 09/09/12 0957  . feeding supplement (NEPRO CARB STEADY) liquid 237 mL  237 mL Oral PRN Nelly Rout, MD      . FLUoxetine (PROZAC) capsule 20 mg  20 mg Oral Daily Nehemiah Settle, MD      .  heparin injection 1,000 Units  1,000 Units Dialysis PRN Hartley Barefoot. Allena Katz, MD      . heparin injection 2,000 Units  2,000 Units Dialysis PRN Hartley Barefoot. Allena Katz, MD   2,000 Units at 09/07/12 1239  . heparin injection 2,000 Units  2,000 Units Dialysis PRN Hartley Barefoot. Allena Katz, MD      . HYDROmorphone (DILAUDID) tablet 2-4 mg  2-4 mg Oral TID PRN Nelly Rout, MD   4 mg at 09/09/12 0756  . ibuprofen (ADVIL,MOTRIN) tablet 600 mg  600 mg Oral Q8H PRN Nelly Rout, MD      . insulin aspart (novoLOG) injection 0-15 Units  0-15 Units Subcutaneous TID WC Nelly Rout,  MD   8 Units at 09/08/12 1711  . insulin glargine (LANTUS) injection 15 Units  15 Units Subcutaneous QHS Nelly Rout, MD   15 Units at 09/08/12 2153  . lidocaine (PF) (XYLOCAINE) 1 % injection 5 mL  5 mL Intradermal PRN Jay K. Allena Katz, MD      . lidocaine-prilocaine (EMLA) cream 1 application  1 application Topical PRN Vonna Kotyk K. Allena Katz, MD      . multivitamin (RENA-VIT) tablet 1 tablet  1 tablet Oral Daily Nelly Rout, MD   1 tablet at 09/08/12 [phone]  . ondansetron (ZOFRAN-ODT) disintegrating tablet 4 mg  4 mg Oral Q6H PRN Nelly Rout, MD      . pantoprazole (PROTONIX) EC tablet 40 mg  40 mg Oral Daily Nelly Rout, MD   40 mg at 09/08/12 9604  . pentafluoroprop-tetrafluoroeth (GEBAUERS) aerosol 1 application  1 application Topical PRN Jay K. Allena Katz, MD      . sodium bicarbonate tablet 650 mg  650 mg Oral BID Marinda Elk, MD   650 mg at 09/08/12 1708  . sodium polystyrene (KAYEXALATE) 15 GM/60ML suspension 15 g  15 g Oral Once Marinda Elk, MD      . sucralfate (CARAFATE) tablet 1 g  1 g Oral TID WC & HS Nelly Rout, MD   1 g at 09/08/12 2151  . zolpidem (AMBIEN) tablet 10 mg  10 mg Oral QHS PRN Nelly Rout, MD   10 mg at 09/08/12 2152    Lab Results:  Results for orders placed during the hospital encounter of 09/06/12 (from the past 48 hour(s))  GLUCOSE, CAPILLARY     Status: Abnormal   Collection Time    09/07/12  4:55 PM      Result Value Range   Glucose-Capillary 122 (*) 70 - 99 mg/dL   Comment 1 Notify RN    GLUCOSE, CAPILLARY     Status: Abnormal   Collection Time    09/07/12  9:10 PM      Result Value Range   Glucose-Capillary 180 (*) 70 - 99 mg/dL  GLUCOSE, CAPILLARY     Status: None   Collection Time    09/08/12  6:03 AM      Result Value Range   Glucose-Capillary 75  70 - 99 mg/dL   Comment 1 Notify RN    GLUCOSE, CAPILLARY     Status: Abnormal   Collection Time    09/08/12 11:55 AM      Result Value Range   Glucose-Capillary 179 (*) 70 - 99 mg/dL    Comment 1 Notify RN    GLUCOSE, CAPILLARY     Status: Abnormal   Collection Time    09/08/12  5:06 PM      Result Value Range   Glucose-Capillary 260 (*) 70 -  99 mg/dL   Comment 1 Notify RN    GLUCOSE, CAPILLARY     Status: Abnormal   Collection Time    09/08/12  9:09 PM      Result Value Range   Glucose-Capillary 161 (*) 70 - 99 mg/dL  BASIC METABOLIC PANEL     Status: Abnormal   Collection Time    09/08/12  9:24 PM      Result Value Range   Sodium 129 (*) 135 - 145 mEq/L   Potassium 5.9 (*) 3.5 - 5.1 mEq/L   Chloride 99  96 - 112 mEq/L   CO2 23  19 - 32 mEq/L   Glucose, Bld 128 (*) 70 - 99 mg/dL   BUN 31 (*) 6 - 23 mg/dL   Creatinine, Ser 6.21 (*) 0.50 - 1.35 mg/dL   Calcium 8.3 (*) 8.4 - 10.5 mg/dL   GFR calc non Af Amer 7 (*) >90 mL/min   GFR calc Af Amer 9 (*) >90 mL/min   Comment:            The eGFR has been calculated     using the CKD EPI equation.     This calculation has not been     validated in all clinical     situations.     eGFR's persistently     <90 mL/min signify     possible Chronic Kidney Disease.     Performed at [facility]  GLUCOSE, CAPILLARY     Status: Abnormal   Collection Time    09/09/12  6:22 AM      Result Value Range   Glucose-Capillary 147 (*) 70 - 99 mg/dL  BASIC METABOLIC PANEL     Status: Abnormal   Collection Time    09/09/12  6:35 AM      Result Value Range   Sodium 129 (*) 135 - 145 mEq/L   Potassium 6.4 (*) 3.5 - 5.1 mEq/L   Comment: NO VISIBLE HEMOLYSIS     CRITICAL RESULT CALLED TO, READ BACK BY AND VERIFIED WITH:     J. BOND RN AT 0804 ON 08.07.14 BY SHUEA.   Chloride 99  96 - 112 mEq/L   CO2 23  19 - 32 mEq/L   Glucose, Bld 140 (*) 70 - 99 mg/dL   BUN 38 (*) 6 - 23 mg/dL   Creatinine, Ser 3.08 (*) 0.50 - 1.35 mg/dL   Calcium 8.7  8.4 - 65.7 mg/dL   GFR calc non Af Amer 7 (*) >90 mL/min   GFR calc Af Amer 8 (*) >90 mL/min   Comment:            The eGFR has been calculated     using the CKD EPI  equation.     This calculation has not been     validated in all clinical     situations.     eGFR's persistently     <90 mL/min signify     possible Chronic Kidney Disease.     Performed at [facility]  CBC     Status: Abnormal   Collection Time    09/09/12 11:16 AM      Result Value Range   WBC 6.5  4.0 - 10.5 K/uL   RBC 3.44 (*) 4.22 - 5.81 MIL/uL   Hemoglobin 10.9 (*) 13.0 - 17.0 g/dL   HCT 84.6 (*) 96.2 - 95.2 %   MCV 95.1  78.0 - 100.0 fL  MCH 31.7  26.0 - 34.0 pg   MCHC 33.3  30.0 - 36.0 g/dL   RDW 16.1  09.6 - 04.5 %   Platelets 143 (*) 150 - 400 K/uL   Comment: Performed at [facility]  GLUCOSE, CAPILLARY     Status: Abnormal   Collection Time    09/09/12  1:11 PM      Result Value Range   Glucose-Capillary 202 (*) 70 - 99 mg/dL   Comment 1 Notify RN      Physical Findings: AIMS:  , ,  ,  ,    CIWA:    COWS:     Treatment Plan Summary: Daily contact with patient to assess and evaluate symptoms and progress in treatment Medication management  Plan: Cotniue fluoxetine to 20 mg a day for depression and anxiety Appreciate hospitalist consult for chronic renal failure Continue hemodialysis 3 times a week as scheduled Start Trazodone 50 mg PO Qhs/sleep and discontinue Ambien not helpful 1. Continue for crisis management and stabilization. 2. Medication management to reduce current symptoms to base line and improve the patient's overall level of functioning. 3. Treat health problems as indicated. 4. Develop treatment plan to decrease risk of relapse upon discharge and to reduce the need for readmission. 5. Psycho-social education regarding relapse prevention and self care. 6. Health care follow up as needed for medical problems. 7. Restart home medications where appropriate. 8. Case discussed with treatment team and case manager will be working with patient regarding placement     Medical Decision Making Problem Points:   Established problem, worsening (2), Review of last therapy session (1) and Review of psycho-social stressors (1) Data Points:  Review or order clinical lab tests (1) Review or order medicine tests (1) Review of medication regiment & side effects (2) Review of new medications or change in dosage (2)  I certify that inpatient services furnished can reasonably be expected to improve the patient's condition.   Nehemiah Settle., M.D. 09/09/2012, 1:26 PM

## 2012-09-09 NOTE — Clinical Social Work Note (Signed)
Writer met with patient regarding discharge plans.  Patient advised of plans to continue living in his car until he is able to afford an apartment.  He shared he will return to [facility] to be close to his brother who is very supportive.  He advised brother is unable to take him into the home but makes himself available to patient as needed.  Patient continues to endorse SI and states he will do it right when discharged from the hospital.

## 2012-09-10 DIAGNOSIS — F332 Major depressive disorder, recurrent severe without psychotic features: Secondary | ICD-10-CM | POA: Diagnosis present

## 2012-09-10 DIAGNOSIS — X838XXA Intentional self-harm by other specified means, initial encounter: Secondary | ICD-10-CM

## 2012-09-10 LAB — GLUCOSE, CAPILLARY
Glucose-Capillary: 162 mg/dL — ABNORMAL HIGH (ref 70–99)
Glucose-Capillary: 216 mg/dL — ABNORMAL HIGH (ref 70–99)

## 2012-09-10 MED ORDER — TRAZODONE HCL 100 MG PO TABS
100.0000 mg | ORAL_TABLET | Freq: Every day | ORAL | Status: DC
  Administered 2012-09-10 – 2012-09-13 (×4): 100 mg via ORAL
  Filled 2012-09-10 (×2): qty 1
  Filled 2012-09-10: qty 3
  Filled 2012-09-10 (×2): qty 1
  Filled 2012-09-10: qty 3

## 2012-09-10 NOTE — Progress Notes (Signed)
[facility] Group Notes:  (Nursing/MHT/Case Management/Adjunct)  Date:  09/10/2012  Time:  10:30 AM  Type of Therapy:  Therapuetic Activity  Participation Level:  Did Not Attend   Joe Vincent 09/10/2012, 10:30 AM
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
## 2012-09-10 NOTE — Progress Notes (Signed)
Joe Vincent is seen [facility] on the [facility] hall today. He is sad, depressed and has a flat affect. HE is cooperative and pleasant to deal with , but keeps mostly to himslef. HE says he handles his fluid restriction himself, ie" I know how to keep up with how much I drink. I can tell".   A He attends his groups as scheduled and he takes his meds as they are ordered.Hre requested and was given 4 mg PO Dilaudid for " generalized" body discomfort and he stated this help " mostly".    R Safety is in place and POC includes continuing to foster therapeutic relationship.

## 2012-09-10 NOTE — Tx Team (Signed)
Interdisciplinary Treatment Plan Update   Date Reviewed:  09/10/2012  Time Reviewed:  9:38 AM  Progress in Treatment:   Attending groups: Yes Participating in groups: Yes Taking medication as prescribed: Yes  Tolerating medication: Yes Family/Significant other contact made: No, attempts have been made but brother has not called back. Patient understands diagnosis: Yes  Discussing patient identified problems/goals with staff: Yes Medical problems stabilized or resolved: Yes Denies suicidal/homicidal ideation: Yes Patient has not harmed self or others: Yes  For review of initial/current patient goals, please see plan of care.  Estimated Length of Stay:  -24 days  Reasons for Continued Hospitalization:  Anxiety Depression Medication stabilization Suicidal ideation  New Problems/Goals identified:    Discharge Plan or Barriers:   Home with outpatient follow up  Additional Comments: N/A  Attendees:  Patient:  09/10/2012 9:38 AM   Signature: Mervyn Gay, MD 09/10/2012 9:38 AM  Signature:  Verne Spurr, PA 09/10/2012 9:38 AM  Signature: Harold Barban, RN 09/10/2012 9:38 AM  Signature:Beverly Terrilee Croak, RN 09/10/2012 9:38 AM  Signature:  Neill Loft RN 09/10/2012 9:38 AM  Signature:  Juline Patch, LCSW 09/10/2012 9:38 AM  Signature:  Reyes Ivan, LCSW 09/10/2012 9:38 AM  Signature:  Maseta Dorley,Care Coordinator 09/10/2012 9:38 AM  Signature: 09/10/2012 9:38 AM  Signature:    Signature:    Signature:      Scribe for Treatment Team:   Juline Patch,  09/10/2012 9:38 AM

## 2012-09-10 NOTE — [facility] Group Notes (Addendum)
[facility] LCSW Group Therapy  Feelings Around Relapse 1:15 -2:30        09/10/2012  3:14 PM  Type of Therapy:  Group Therapy  Participation Level:  Minimal  Participation Quality:  Minimal  Affect:  Appropriate  Cognitive:  Attentive Appropriate  Insight:  Limited  Engagement in Therapy: Limited  Modes of Intervention:  Discussion Exploration Problem-Solving Supportive  Summary of Progress/Problems:  Patient left group early without participating in discussion.  Wynn Banker 09/10/2012 3:14 PM

## 2012-09-10 NOTE — [facility] Group Notes (Signed)
[facility] LCSW Aftercare Discharge Planning Group Note   09/10/2012 9:39 AM  Participation Quality:  Did not attend  Joe Vincent, Joe Vincent

## 2012-09-10 NOTE — Progress Notes (Signed)
Patient ID: Joe Vincent, male   DOB: 12/10/1960, 52 y.o.   MRN: [id number] [facility] MD Progress Note  09/10/2012 12:11 PM Joe Vincent  MRN:  [id number]  Subjective:  Patient was found in his room sleeping after breakfast and stated that he could not sleep last night due to his sleep apnea. He has been participating on the unit activities. He does not have objective finding of depression but continue to report subjective report of depression and says no change. Patient seems like having secondary gains for being in hospital. Patient need to be closely observed his interactions, socialization and participation in the unit and documented appropriatly due to patient has a less motivation to be discharged because homelessness. Patient has been homeless and living in his own car. Case manager stated that he does not want to be placed because of concerns of giving his disability money and he wants to return to [employer] and find an apartment after discharge. He feels his brother in law is going to his support system.   Diagnosis:  Axis I: Major Depression, Recurrent severe  ADL's:  Impaired  Sleep: Poor  Appetite:  Poor  Suicidal Ideation:  Patient has been suicidal with a plan of overdose on medications and has a recent suicide attempt Homicidal Ideation:  Denied AEB (as evidenced by):  Psychiatric Specialty Exam: ROS  Blood pressure 94/52, pulse 61, temperature 98 F (36.7 C), temperature source Oral, resp. rate 16, height 5' 9.25" (1.759 m), weight 62.4 kg (137 lb 9.1 oz), SpO2 98.00%.Body mass index is 20.17 kg/(m^2).  General Appearance: Disheveled and Guarded  Eye Solicitor::  Fair  Speech:  Clear and Coherent  Volume:  Normal  Mood:  Anxious, Depressed, Hopeless and Worthless  Affect:  Constricted and Depressed  Thought Process:  Goal Directed and Intact  Orientation:  Full (Time, Place, and Person)  Thought Content:  Rumination  Suicidal Thoughts:  Yes.  with intent/plan  Homicidal Thoughts:  No   Memory:  Immediate;   Fair  Judgement:  Impaired  Insight:  Lacking  Psychomotor Activity:  Decreased and Psychomotor Retardation  Concentration:  Fair  Recall:  Fair  Akathisia:  NA  Handed:  Right  AIMS (if indicated):     Assets:  Communication Skills Desire for Improvement Leisure Time Resilience  Sleep:  Number of Hours: 3.5   Current Medications: Current Facility-Administered Medications  Medication Dose Route Frequency Provider Last Rate Last Dose  . 0.9 %  sodium chloride infusion  100 mL Intravenous PRN Jay K. Allena Katz, MD      . 0.9 %  sodium chloride infusion  100 mL Intravenous PRN Vonna Kotyk K. Allena Katz, MD      . acetaminophen (TYLENOL) tablet 650 mg  650 mg Oral Q6H PRN Nelly Rout, MD      . diazepam (VALIUM) tablet 10 mg  10 mg Oral BID Nelly Rout, MD   10 mg at 09/10/12 0816  . doxercalciferol (HECTOROL) injection 1 mcg  1 mcg Intravenous Q T,Th,Sa-HD Jay K. Allena Katz, MD   1 mcg at 09/09/12 0957  . feeding supplement (NEPRO CARB STEADY) liquid 237 mL  237 mL Oral PRN Nelly Rout, MD      . FLUoxetine (PROZAC) capsule 40 mg  40 mg Oral Daily Nehemiah Settle, MD   40 mg at 09/10/12 0816  . heparin injection 1,000 Units  1,000 Units Dialysis PRN Hartley Barefoot. Allena Katz, MD      . HYDROmorphone (DILAUDID) tablet 2-4 mg  2-4 mg  Oral TID PRN Nelly Rout, MD   4 mg at 09/10/12 0835  . ibuprofen (ADVIL,MOTRIN) tablet 600 mg  600 mg Oral Q8H PRN Nelly Rout, MD      . insulin aspart (novoLOG) injection 0-15 Units  0-15 Units Subcutaneous TID WC Nelly Rout, MD   3 Units at 09/10/12 [phone]  . insulin glargine (LANTUS) injection 15 Units  15 Units Subcutaneous QHS Nelly Rout, MD   15 Units at 09/09/12 2146  . lidocaine (PF) (XYLOCAINE) 1 % injection 5 mL  5 mL Intradermal PRN Jay K. Allena Katz, MD      . lidocaine-prilocaine (EMLA) cream 1 application  1 application Topical PRN Vonna Kotyk K. Allena Katz, MD      . multivitamin (RENA-VIT) tablet 1 tablet  1 tablet Oral Daily Nelly Rout, MD   1  tablet at 09/09/12 1321  . ondansetron (ZOFRAN-ODT) disintegrating tablet 4 mg  4 mg Oral Q6H PRN Nelly Rout, MD      . pantoprazole (PROTONIX) EC tablet 40 mg  40 mg Oral Daily Nelly Rout, MD   40 mg at 09/10/12 0816  . pentafluoroprop-tetrafluoroeth (GEBAUERS) aerosol 1 application  1 application Topical PRN Jay K. Allena Katz, MD      . sodium bicarbonate tablet 650 mg  650 mg Oral BID Marinda Elk, MD   650 mg at 09/10/12 0816  . sodium polystyrene (KAYEXALATE) 15 GM/60ML suspension 15 g  15 g Oral Once Marinda Elk, MD      . sucralfate (CARAFATE) tablet 1 g  1 g Oral TID AC Neil Mashburn, PA-C   1 g at 09/10/12 0700  . traZODone (DESYREL) tablet 50 mg  50 mg Oral QHS Nehemiah Settle, MD   50 mg at 09/09/12 2145    Lab Results:  Results for orders placed during the hospital encounter of 09/06/12 (from the past 48 hour(s))  GLUCOSE, CAPILLARY     Status: Abnormal   Collection Time    09/08/12  5:06 PM      Result Value Range   Glucose-Capillary 260 (*) 70 - 99 mg/dL   Comment 1 Notify RN    GLUCOSE, CAPILLARY     Status: Abnormal   Collection Time    09/08/12  9:09 PM      Result Value Range   Glucose-Capillary 161 (*) 70 - 99 mg/dL  BASIC METABOLIC PANEL     Status: Abnormal   Collection Time    09/08/12  9:24 PM      Result Value Range   Sodium 129 (*) 135 - 145 mEq/L   Potassium 5.9 (*) 3.5 - 5.1 mEq/L   Chloride 99  96 - 112 mEq/L   CO2 23  19 - 32 mEq/L   Glucose, Bld 128 (*) 70 - 99 mg/dL   BUN 31 (*) 6 - 23 mg/dL   Creatinine, Ser 9.56 (*) 0.50 - 1.35 mg/dL   Calcium 8.3 (*) 8.4 - 10.5 mg/dL   GFR calc non Af Amer 7 (*) >90 mL/min   GFR calc Af Amer 9 (*) >90 mL/min   Comment:            The eGFR has been calculated     using the CKD EPI equation.     This calculation has not been     validated in all clinical     situations.     eGFR's persistently     <90 mL/min signify     possible Chronic Kidney Disease.  Performed at [facility]  GLUCOSE, CAPILLARY     Status: Abnormal   Collection Time    09/09/12  6:22 AM      Result Value Range   Glucose-Capillary 147 (*) 70 - 99 mg/dL  BASIC METABOLIC PANEL     Status: Abnormal   Collection Time    09/09/12  6:35 AM      Result Value Range   Sodium 129 (*) 135 - 145 mEq/L   Potassium 6.4 (*) 3.5 - 5.1 mEq/L   Comment: NO VISIBLE HEMOLYSIS     CRITICAL RESULT CALLED TO, READ BACK BY AND VERIFIED WITH:     J. BOND RN AT 0804 ON 08.07.14 BY SHUEA.   Chloride 99  96 - 112 mEq/L   CO2 23  19 - 32 mEq/L   Glucose, Bld 140 (*) 70 - 99 mg/dL   BUN 38 (*) 6 - 23 mg/dL   Creatinine, Ser 4.54 (*) 0.50 - 1.35 mg/dL   Calcium 8.7  8.4 - 09.8 mg/dL   GFR calc non Af Amer 7 (*) >90 mL/min   GFR calc Af Amer 8 (*) >90 mL/min   Comment:            The eGFR has been calculated     using the CKD EPI equation.     This calculation has not been     validated in all clinical     situations.     eGFR's persistently     <90 mL/min signify     possible Chronic Kidney Disease.     Performed at [facility]  CBC     Status: Abnormal   Collection Time    09/09/12 11:16 AM      Result Value Range   WBC 6.5  4.0 - 10.5 K/uL   RBC 3.44 (*) 4.22 - 5.81 MIL/uL   Hemoglobin 10.9 (*) 13.0 - 17.0 g/dL   HCT 11.9 (*) 14.7 - 82.9 %   MCV 95.1  78.0 - 100.0 fL   MCH 31.7  26.0 - 34.0 pg   MCHC 33.3  30.0 - 36.0 g/dL   RDW 56.2  13.0 - 86.5 %   Platelets 143 (*) 150 - 400 K/uL   Comment: Performed at [facility]  GLUCOSE, CAPILLARY     Status: Abnormal   Collection Time    09/09/12  1:11 PM      Result Value Range   Glucose-Capillary 202 (*) 70 - 99 mg/dL   Comment 1 Notify RN    GLUCOSE, CAPILLARY     Status: Abnormal   Collection Time    09/09/12  4:57 PM      Result Value Range   Glucose-Capillary 137 (*) 70 - 99 mg/dL  GLUCOSE, CAPILLARY     Status: Abnormal   Collection Time    09/09/12  9:37 PM      Result Value Range    Glucose-Capillary 183 (*) 70 - 99 mg/dL   Comment 1 Notify RN     Comment 2 Documented in Chart    GLUCOSE, CAPILLARY     Status: Abnormal   Collection Time    09/10/12  6:10 AM      Result Value Range   Glucose-Capillary 162 (*) 70 - 99 mg/dL  GLUCOSE, CAPILLARY     Status: Abnormal   Collection Time    09/10/12 11:47 AM      Result Value Range  Glucose-Capillary 216 (*) 70 - 99 mg/dL    Physical Findings: AIMS: Facial and Oral Movements Muscles of Facial Expression: None, normal Lips and Perioral Area: None, normal Jaw: None, normal Tongue: None, normal,Extremity Movements Upper (arms, wrists, hands, fingers): None, normal Lower (legs, knees, ankles, toes): None, normal, Trunk Movements Neck, shoulders, hips: None, normal, Overall Severity Severity of abnormal movements (highest score from questions above): None, normal Incapacitation due to abnormal movements: None, normal Patient's awareness of abnormal movements (rate only patient's report): No Awareness, Dental Status Current problems with teeth and/or dentures?: No Does patient usually wear dentures?: No  CIWA:    COWS:     Treatment Plan Summary: Daily contact with patient to assess and evaluate symptoms and progress in treatment Medication management  Plan: Increase fluoxetine 40 mg a day for depression and anxiety Appreciate hospitalist consult for chronic renal failure Continue hemodialysis 3 times a week as scheduled Increase Trazodone 100 mg PO Qhs/sleep and discontinue Ambien not helpful 1. Continue for crisis management and stabilization. 2. Medication management to reduce current symptoms to base line and improve the patient's overall level of functioning. 3. Treat health problems as indicated. 4. Develop treatment plan to decrease risk of relapse upon discharge and to reduce the need for readmission. 5. Psycho-social education regarding relapse prevention and self care. 6. Health care follow up as  needed for medical problems. 7. Restart home medications where appropriate. 8. Case discussed with treatment team and case manager will be working with patient regarding disposition plans   Medical Decision Making Problem Points:  Established problem, worsening (2), Review of last therapy session (1) and Review of psycho-social stressors (1) Data Points:  Review or order clinical lab tests (1) Review or order medicine tests (1) Review of medication regiment & side effects (2) Review of new medications or change in dosage (2)  I certify that inpatient services furnished can reasonably be expected to improve the patient's condition.   Nehemiah Settle., M.D. 09/10/2012, 12:11 PM

## 2012-09-10 NOTE — Progress Notes (Signed)
Patient ID: Joe Vincent, male   DOB: February 20, 1960, 52 y.o.   MRN: [id number]  D:  Pt states he has been having Short term memory loss for the past 6 months.  Pt endorses SI, but contracts for safety . " I still will commit suicide when I leave here, I want long term care". Pt +ve AVH, "I see shadows and just hear things".  Pt denies HI. Pt is pleasant and cooperative.   A: Pt was offered support and encouragement. Pt was given scheduled medications. Pt was encourage to attend groups. Q 15 minute checks were done for safety.   R:Pt attends groups and interacts well with peers and staff. Pt is taking medication.Pt receptive to treatment and safety maintained on unit.

## 2012-09-10 NOTE — Progress Notes (Signed)
[facility] Group Notes:  (Nursing/MHT/Case Management/Adjunct)  Date:  09/10/2012  Time:  2000  Type of Therapy:  Psychoeducational Skills  Participation Level:  Active  Participation Quality:  Attentive and Monopolizing  Affect:  Appropriate  Cognitive:  Appropriate  Insight:  Good  Engagement in Group:  Distracting  Modes of Intervention:  Education  Summary of Progress/Problems: The patient stated in group that he had a "happier" day. Despite the fact that he shared that he had a good day, the patient mentioned that he didn't sleep well at night. He explained that he normally uses a CPAP machine at home. His goal for tomorrow is to try and make it through the day.   Joe Vincent 09/10/2012, 9:35 PM

## 2012-09-11 ENCOUNTER — Non-Acute Institutional Stay (HOSPITAL_COMMUNITY)
Admission: AD | Admit: 2012-09-11 | Discharge: 2012-09-11 | Disposition: A | Discharge status: Home / Self Care | Payer: Medicare Other | Source: Ambulatory Visit | Attending: Nephrology | Admitting: Nephrology

## 2012-09-11 ENCOUNTER — Encounter (HOSPITAL_COMMUNITY): Payer: Self-pay | Admitting: Registered Nurse

## 2012-09-11 LAB — POCT I-STAT, CHEM 8
Glucose, Bld: 99 mg/dL (ref 70–99)
HCT: 33 % — ABNORMAL LOW (ref 39.0–52.0)
Hemoglobin: 11.2 g/dL — ABNORMAL LOW (ref 13.0–17.0)
Potassium: 3.8 mEq/L (ref 3.5–5.1)
Sodium: 139 mEq/L (ref 135–145)
TCO2: 28 mmol/L (ref 0–100)

## 2012-09-11 LAB — CBC
HCT: 33.7 % — ABNORMAL LOW (ref 39.0–52.0)
Hemoglobin: 11.3 g/dL — ABNORMAL LOW (ref 13.0–17.0)
MCH: 31.6 pg (ref 26.0–34.0)
MCHC: 33.5 g/dL (ref 30.0–36.0)
RDW: 14.4 % (ref 11.5–15.5)

## 2012-09-11 LAB — RENAL FUNCTION PANEL
BUN: 50 mg/dL — ABNORMAL HIGH (ref 6–23)
Calcium: 9 mg/dL (ref 8.4–10.5)
Creatinine, Ser: 8.4 mg/dL — ABNORMAL HIGH (ref 0.50–1.35)
Glucose, Bld: 107 mg/dL — ABNORMAL HIGH (ref 70–99)
Phosphorus: 0.7 mg/dL — CL (ref 2.3–4.6)

## 2012-09-11 MED ORDER — HEPARIN SODIUM (PORCINE) 1000 UNIT/ML DIALYSIS
3000.0000 [IU] | INTRAMUSCULAR | Status: DC | PRN
  Administered 2012-09-11: 3000 [IU] via INTRAVENOUS_CENTRAL
  Filled 2012-09-11: qty 3

## 2012-09-11 MED ORDER — HYDROMORPHONE HCL PF 1 MG/ML IJ SOLN
2.0000 mg | Freq: Once | INTRAMUSCULAR | Status: AC
  Administered 2012-09-11: 2 mg via INTRAVENOUS

## 2012-09-11 NOTE — Progress Notes (Signed)
[facility] Group Notes:  (Nursing/MHT/Case Management/Adjunct)  Date:  09/11/2012  Time:  2000  Type of Therapy:  Psychoeducational Skills  Participation Level:  Active  Participation Quality:  Attentive  Affect:  Depressed  Cognitive:  Appropriate  Insight:  Improving  Engagement in Group:  Improving  Modes of Intervention:  Education  Summary of Progress/Problems: The patient shared with the group that he had a bad day due to his complications from his diabetes. Upon return from dialysis, the patient reported that he began to feel better. The "good company" and support of his peers helped him to recover. His goal for tomorrow is to make it a better day.   Larone Kliethermes S 09/11/2012, 10:08 PM

## 2012-09-11 NOTE — Progress Notes (Signed)
Received a critical value from lab of K=>7.5 from pre dialysis labs that were sent at 1240pm stat. I got the call from lab at 1640, when the pt was just finishing his 4 hour tx.  Dr Marisue Humble notified, stat ISTAT done and K=3.8.  No new orders given.

## 2012-09-11 NOTE — Progress Notes (Signed)
Psychoeducational Group Note  Date: 09/11/2012 Time:  1015  Group Topic/Focus:  Identifying Needs:   The focus of this group is to help patients identify their personal needs that have been historically problematic and identify healthy behaviors to address their needs.  Participation Level:  Did not attend  Ladon Heney A 

## 2012-09-11 NOTE — Progress Notes (Signed)
CRITICAL VALUE ALERT  Critical value received:  K=>7.5  Date of notification:  09/11/12  Time of notification:  1640  Critical value read back:yes  Nurse who received alert:  Ollen Barges  MD notified (1st page):  Sanford,Ryan  Time of first page:  1642  MD notified (2nd page):Sanford,Ryan  Time of second page:1645  Responding MD:  Sabra Heck  Time MD responded:  [phone]

## 2012-09-11 NOTE — Procedures (Signed)
Patient seen on Hemodialysis. Agreed to have dialysis today after initial refusal to come to treatment- I appreciate the nurses at [facility] who convinced him to get his treatment (as elective labs were not possible) QB 400, UF goal 3.5L Treatment adjusted as needed.  Zetta Bills MD [facility]. Office # [phone] Pager # [phone] 1:27 PM

## 2012-09-11 NOTE — Progress Notes (Signed)
D Per did not get up this AM, for his morning meds. When this nurse approached him to see what was wrong, he stated " I feel awfull. I been throwing up all morning and I feel awful. Just please leave me alone."    A He refused to go to his previously scheduled hemodialysis appt whne thye called for him to come ( 0900) and this was communicated to hemo charge nurse Willaim Sheng). He agreed to go to dialysis after he had slept  And at 1200 he got up, hemo dept notified and they instructed [facility] to send him there.   R Per POC, pt was taken to hemodialysis dept at [facility] - with MHT _ via security and currently pt undergoing regular hemodialysis.

## 2012-09-11 NOTE — [facility] Group Notes (Signed)
[facility] Group Notes: (Clinical Social Work)   09/11/2012      Type of Therapy:  Group Therapy   Participation Level:  Did Not Attend    Ambrose Mantle, LCSW 09/11/2012, 4:19 PM

## 2012-09-11 NOTE — Progress Notes (Addendum)
Observed pt coming down hall ( in w/c) after dinner served. I asked him when he returned to the unit and he said " Just a little while ago".  He said " I feel so much better now". MHT checked cbg and it is 176 ( no SSI indicated per post hemodialysis orders).   A QD meds administered and he is cooperative, not anxious, not agitated and stated " I'm ready for a good night sleep".  Positive bruit and thrill RUA fistula .   R Safety is in place and POC cont.

## 2012-09-11 NOTE — Progress Notes (Signed)
Patient ID: Joe Vincent, male   DOB: 1960/10/10, 52 y.o.   MRN: [id number]  D:  Pt endorses passive SI/ AVH, but pt contracts for safety. Pt denies HI. Pt is pleasant and cooperative. Pt concerned about K+ level.   A: Pt was offered support and encouragement. Pt was given scheduled medications. Pt was encourage to attend groups. Q 15 minute checks were done for safety.   R:Pt attends groups and interacts well with peers and staff. Pt is taking medication. Pt receptive to treatment and safety maintained on unit.

## 2012-09-11 NOTE — Progress Notes (Signed)
Patient ID: Joe Vincent, male   DOB: 12/16/1960, 52 y.o.   MRN: [id number] [facility] MD Progress Note  09/11/2012 9:41 AM Joe Vincent  MRN:  [id number]  Subjective: Patient in bed would not get up for interview stating that he was not feeling well and that he was having pains in his legs related to neuropathy.  Patient states that he continues to be depressed and that "I will just finish the job when I get out of here."  When asked what he ment about finishing the job he states "to kill my self."  Patient states that he has not came up with a plan as of yet on how he was going to kill himself.   Continue to monitor patient need to be closely observed his interactions, socialization and participation in the unit and documented appropriately due to patient has a less motivation to be discharged because homelessness.   Diagnosis:  Axis I: Major Depression, Recurrent severe  ADL's:  Impaired  Sleep: Poor  Appetite:  Poor  Suicidal Ideation:  Patient has been suicidal with a plan of overdose on medications and has a recent suicide attempt Homicidal Ideation:  Denied AEB (as evidenced by): Patient unable to participate in group this morning stating because he was not feeling well.  Patient tolerating medications without adverse effects.    Psychiatric Specialty Exam: Review of Systems  Gastrointestinal: Positive for nausea and vomiting (Patient states that he has vomit 3 times this morning and has spoken to the nurse.).  Psychiatric/Behavioral: Positive for depression and suicidal ideas. Negative for hallucinations (Denies). The patient is nervous/anxious.     Blood pressure 100/60, pulse 79, temperature 98 F (36.7 C), temperature source Oral, resp. rate 16, height 5' 9.25" (1.759 m), weight 62.4 kg (137 lb 9.1 oz), SpO2 98.00%.Body mass index is 20.17 kg/(m^2).  General Appearance: Disheveled and Guarded  Eye Solicitor::  Fair  Speech:  Clear and Coherent  Volume:  Normal  Mood:  Anxious, Depressed,  Hopeless and Worthless  Affect:  Constricted and Depressed  Thought Process:  Goal Directed and Intact  Orientation:  Full (Time, Place, and Person)  Thought Content:  Rumination  Suicidal Thoughts:  Yes.  with intent/plan  Homicidal Thoughts:  No  Memory:  Immediate;   Fair  Judgement:  Impaired  Insight:  Lacking  Psychomotor Activity:  Decreased and Psychomotor Retardation  Concentration:  Fair  Recall:  Fair  Akathisia:  NA  Handed:  Right  AIMS (if indicated):     Assets:  Communication Skills Desire for Improvement Leisure Time Resilience  Sleep:  Number of Hours: 6.75   Current Medications: Current Facility-Administered Medications  Medication Dose Route Frequency Provider Last Rate Last Dose  . 0.9 %  sodium chloride infusion  100 mL Intravenous PRN Jay K. Allena Katz, MD      . 0.9 %  sodium chloride infusion  100 mL Intravenous PRN Vonna Kotyk K. Allena Katz, MD      . acetaminophen (TYLENOL) tablet 650 mg  650 mg Oral Q6H PRN Nelly Rout, MD      . diazepam (VALIUM) tablet 10 mg  10 mg Oral BID Nelly Rout, MD   10 mg at 09/10/12 1708  . doxercalciferol (HECTOROL) injection 1 mcg  1 mcg Intravenous Q T,Th,Sa-HD Jay K. Allena Katz, MD   1 mcg at 09/09/12 0957  . feeding supplement (NEPRO CARB STEADY) liquid 237 mL  237 mL Oral PRN Nelly Rout, MD      . FLUoxetine (PROZAC) capsule  40 mg  40 mg Oral Daily Nehemiah Settle, MD   40 mg at 09/10/12 0816  . heparin injection 1,000 Units  1,000 Units Dialysis PRN Hartley Barefoot. Allena Katz, MD      . heparin injection 3,000 Units  3,000 Units Dialysis PRN Hartley Barefoot. Allena Katz, MD      . HYDROmorphone (DILAUDID) tablet 2-4 mg  2-4 mg Oral TID PRN Nelly Rout, MD   2 mg at 09/10/12 2155  . ibuprofen (ADVIL,MOTRIN) tablet 600 mg  600 mg Oral Q8H PRN Nelly Rout, MD      . insulin aspart (novoLOG) injection 0-15 Units  0-15 Units Subcutaneous TID WC Nelly Rout, MD   3 Units at 09/10/12 1720  . insulin glargine (LANTUS) injection 15 Units  15 Units  Subcutaneous QHS Nelly Rout, MD   15 Units at 09/10/12 2131  . lidocaine (PF) (XYLOCAINE) 1 % injection 5 mL  5 mL Intradermal PRN Jay K. Allena Katz, MD      . lidocaine-prilocaine (EMLA) cream 1 application  1 application Topical PRN Vonna Kotyk K. Allena Katz, MD      . multivitamin (RENA-VIT) tablet 1 tablet  1 tablet Oral Daily Nelly Rout, MD   1 tablet at 09/10/12 0800  . ondansetron (ZOFRAN-ODT) disintegrating tablet 4 mg  4 mg Oral Q6H PRN Nelly Rout, MD      . pantoprazole (PROTONIX) EC tablet 40 mg  40 mg Oral Daily Nelly Rout, MD   40 mg at 09/10/12 0816  . pentafluoroprop-tetrafluoroeth (GEBAUERS) aerosol 1 application  1 application Topical PRN Jay K. Allena Katz, MD      . sodium bicarbonate tablet 650 mg  650 mg Oral BID Marinda Elk, MD   650 mg at 09/10/12 1708  . sodium polystyrene (KAYEXALATE) 15 GM/60ML suspension 15 g  15 g Oral Once Marinda Elk, MD      . sucralfate (CARAFATE) tablet 1 g  1 g Oral TID [facility] Verne Spurr, PA-C   1 g at 09/10/12 1708  . traZODone (DESYREL) tablet 100 mg  100 mg Oral QHS Nehemiah Settle, MD   100 mg at 09/10/12 2155    Lab Results:  Results for orders placed during the hospital encounter of 09/06/12 (from the past 48 hour(s))  CBC     Status: Abnormal   Collection Time    09/09/12 11:16 AM      Result Value Range   WBC 6.5  4.0 - 10.5 K/uL   RBC 3.44 (*) 4.22 - 5.81 MIL/uL   Hemoglobin 10.9 (*) 13.0 - 17.0 g/dL   HCT 16.1 (*) 09.6 - 04.5 %   MCV 95.1  78.0 - 100.0 fL   MCH 31.7  26.0 - 34.0 pg   MCHC 33.3  30.0 - 36.0 g/dL   RDW 40.9  81.1 - 91.4 %   Platelets 143 (*) 150 - 400 K/uL   Comment: Performed at [facility]  GLUCOSE, CAPILLARY     Status: Abnormal   Collection Time    09/09/12  1:11 PM      Result Value Range   Glucose-Capillary 202 (*) 70 - 99 mg/dL   Comment 1 Notify RN    GLUCOSE, CAPILLARY     Status: Abnormal   Collection Time    09/09/12  4:57 PM      Result Value Range   Glucose-Capillary  137 (*) 70 - 99 mg/dL  GLUCOSE, CAPILLARY     Status: Abnormal   Collection Time  09/09/12  9:37 PM      Result Value Range   Glucose-Capillary 183 (*) 70 - 99 mg/dL   Comment 1 Notify RN     Comment 2 Documented in Chart    GLUCOSE, CAPILLARY     Status: Abnormal   Collection Time    09/10/12  6:10 AM      Result Value Range   Glucose-Capillary 162 (*) 70 - 99 mg/dL  GLUCOSE, CAPILLARY     Status: Abnormal   Collection Time    09/10/12 11:47 AM      Result Value Range   Glucose-Capillary 216 (*) 70 - 99 mg/dL  GLUCOSE, CAPILLARY     Status: Abnormal   Collection Time    09/10/12  4:50 PM      Result Value Range   Glucose-Capillary 176 (*) 70 - 99 mg/dL  GLUCOSE, CAPILLARY     Status: Abnormal   Collection Time    09/10/12  9:12 PM      Result Value Range   Glucose-Capillary 188 (*) 70 - 99 mg/dL  GLUCOSE, CAPILLARY     Status: None   Collection Time    09/11/12  6:31 AM      Result Value Range   Glucose-Capillary 98  70 - 99 mg/dL    Physical Findings: AIMS: Facial and Oral Movements Muscles of Facial Expression: None, normal Lips and Perioral Area: None, normal Jaw: None, normal Tongue: None, normal,Extremity Movements Upper (arms, wrists, hands, fingers): None, normal Lower (legs, knees, ankles, toes): None, normal, Trunk Movements Neck, shoulders, hips: None, normal, Overall Severity Severity of abnormal movements (highest score from questions above): None, normal Incapacitation due to abnormal movements: None, normal Patient's awareness of abnormal movements (rate only patient's report): No Awareness, Dental Status Current problems with teeth and/or dentures?: No Does patient usually wear dentures?: No  CIWA:    COWS:     Treatment Plan Summary: Daily contact with patient to assess and evaluate symptoms and progress in treatment Medication management  Plan:   Will continue current plan and treatment.  Increase fluoxetine 40 mg a day for depression and  anxiety Appreciate hospitalist consult for chronic renal failure Continue hemodialysis 3 times a week as scheduled Increase Trazodone 100 mg PO Qhs/sleep and discontinue Ambien not helpful 1. Continue for crisis management and stabilization. 2. Medication management to reduce current symptoms to base line and improve the patient's overall level of functioning. 3. Treat health problems as indicated. 4. Develop treatment plan to decrease risk of relapse upon discharge and to reduce the need for readmission. 5. Psycho-social education regarding relapse prevention and self care. 6. Health care follow up as needed for medical problems. 7. Restart home medications where appropriate. 8. Case discussed with treatment team and case manager will be working with patient regarding disposition plans   Medical Decision Making Problem Points:  Established problem, worsening (2), Review of last therapy session (1) and Review of psycho-social stressors (1) Data Points:  Review or order clinical lab tests (1) Review of medication regiment & side effects (2) Review of new medications or change in dosage (2)  I certify that inpatient services furnished can reasonably be expected to improve the patient's condition.   Rankin, Shuvon, FNP-BC     09/11/2012, 9:41 AM

## 2012-09-12 DIAGNOSIS — F332 Major depressive disorder, recurrent severe without psychotic features: Principal | ICD-10-CM

## 2012-09-12 MED ORDER — NICOTINE 14 MG/24HR TD PT24
MEDICATED_PATCH | TRANSDERMAL | Status: AC
Start: 2012-09-12 — End: 2012-09-12
  Administered 2012-09-12: 13:00:00
  Filled 2012-09-12: qty 1

## 2012-09-12 MED ORDER — ARIPIPRAZOLE 5 MG PO TABS
5.0000 mg | ORAL_TABLET | Freq: Every day | ORAL | Status: DC
  Administered 2012-09-13 – 2012-09-14 (×2): 5 mg via ORAL
  Filled 2012-09-12 (×2): qty 1
  Filled 2012-09-12: qty 3
  Filled 2012-09-12: qty 1
  Filled 2012-09-12: qty 3

## 2012-09-12 NOTE — Progress Notes (Signed)
Adult Psychoeducational Group Note  Date:  09/12/2012 Time:  1:30PM  Group Topic/Focus:  Making Healthy Choices:   The focus of this group is to help patients identify negative/unhealthy choices they were using prior to admission and identify positive/healthier coping strategies to replace them upon discharge.  Participation Level:  Did Not Attend  Additional Comments:  Pt did not attend group. Pt was in his room, in the bed asleep  Alastair Hennes K 09/12/2012, 2:54 PM

## 2012-09-12 NOTE — [facility] Group Notes (Signed)
[facility] Group Notes:  (Clinical Social Work)  09/12/2012   3:00-4:00PM  Summary of Progress/Problems:   The main focus of today's process group was to   identify the patient's current support system and decide on other supports that can be put in place.  The picture on workbook was used to discuss why additional supports are needed, and a hand-out was distributed with four definitions/levels of support, then used to talk about how patients have given and received all different kinds of support.  An emphasis was placed on using counselor, doctor, therapy groups, 12-step groups, and problem-specific support groups to expand supports.  The patient stated he has no supports, then talked at length about the problems he has encountered in getting Medicaid and other assistance, as well as paying child support on a 19yo not in school.  CSW attempted to redirect him multiple times without success, as other patients were joining in his complaints.  He did admit that his sister is supportive emotionally and spiritually and that he receives disability which is a financial support.  Type of Therapy:  Process Group  Participation Level:  Active  Participation Quality:  Attentive, Intrusive, Monopolizing and Sharing  Affect:  Blunted  Cognitive:  Alert  Insight:  Monopolizing  Engagement in Therapy:  Monopolizing  Modes of Intervention:  Education,  Support and [employer], LCSW 09/12/2012, 4:24 PM

## 2012-09-12 NOTE — Progress Notes (Signed)
Psychoeducational Group Note  Date:  09/12/2012 Time:  1015  Group Topic/Focus:  Making Healthy Choices:   The focus of this group is to help patients identify negative/unhealthy choices they were using prior to admission and identify positive/healthier coping strategies to replace them upon discharge.  Participation Level:  Did Not Attend  Nonie Lochner A 09/12/2012  

## 2012-09-12 NOTE — Progress Notes (Signed)
[facility] Group Notes:  (Nursing/MHT/Case Management/Adjunct)  Date:  09/12/2012  Time:  2000  Type of Therapy:  Psychoeducational Skills  Participation Level:  Active  Participation Quality:  Attentive  Affect:  Blunted  Cognitive:  Appropriate  Insight:  Lacking  Engagement in Group:  Developing/Improving  Modes of Intervention:  Education  Summary of Progress/Problems: The patient stated in group that he an okay day. He stated that it helped to be around his peers. He also shared that he felt the same as he did yesterday. His goal for tomorrow is to get some rest.   Che Rachal S 09/12/2012, 11:36 PM

## 2012-09-12 NOTE — Progress Notes (Signed)
PPatient ID: Joe Vincent, male   DOB: 06-06-1960, 52 y.o.   MRN: [id number] [facility] MD Progress Note  09/12/2012 3:15 PM Joe Vincent  MRN:  [id number]  Subjective: Patient seen in his room in bed, stating he is in too much pain from his neuropathy to attend group. He states he hasn't slept more than 2 hours at any time since he arrived here. He sates his depression is a 9/10 and that his anxiety is a 9/10. He wants long term care "like [facility] or [facility] where I can go until I am healthier."  Patient also refused to attend his dialysis at the scheduled time due to "needing to sleep."  Objective: Patient's voice is strong, he is resting comfortably in bed and does not appear to be in any acute distress or discomfort. His speech is clear and goal directed. He does not change position or shift to get comfortable. His affect is pleasant. His reports of severe pain appear to be incongruent with the patient's behaviors. He presents an affect of entitlement of long term care and seems system savy enough to maximize his symptoms of depression and suicidal ideation to ensure housing in an in patient setting.   Diagnosis:  Axis I: Major Depression, Recurrent severe  ADL's:  Impaired  Sleep: Poor  Appetite:  Poor  Suicidal Ideation:  Patient has been suicidal with a plan of overdose on medications and has a recent suicide attempt Homicidal Ideation:  Denied AEB (as evidenced by): Patient unable to participate in group this morning stating because he was not feeling well.  Patient tolerating medications without adverse effects.    Psychiatric Specialty Exam: Review of Systems  Gastrointestinal: Positive for nausea and vomiting (Patient states that he has vomit 3 times this morning and has spoken to the nurse.).  Psychiatric/Behavioral: Positive for depression and suicidal ideas. Negative for hallucinations (Denies). The patient is nervous/anxious.     Blood pressure 76/47, pulse 85, temperature 97.5 F (36.4  C), temperature source Oral, resp. rate 18, height 5' 9.25" (1.759 m), weight 61.4 kg (135 lb 5.8 oz), SpO2 98.00%.Body mass index is 19.84 kg/(m^2).  General Appearance: Disheveled and Guarded  Eye Solicitor::  Fair  Speech:  Clear and Coherent  Volume:  Normal  Mood:  Anxious, Depressed, Hopeless and Worthless  Affect:  Constricted and Depressed  Thought Process:  Goal Directed and Intact  Orientation:  Full (Time, Place, and Person)  Thought Content:  Rumination  Suicidal Thoughts:  Yes.  with intent/plan  Homicidal Thoughts:  No  Memory:  Immediate;   Fair  Judgement:  Impaired  Insight:  Lacking  Psychomotor Activity:  Decreased and Psychomotor Retardation  Concentration:  Fair  Recall:  Fair  Akathisia:  NA  Handed:  Right  AIMS (if indicated):     Assets:  Communication Skills Desire for Improvement Leisure Time Resilience  Sleep:  Number of Hours: 4.25   Current Medications: Current Facility-Administered Medications  Medication Dose Route Frequency Provider Last Rate Last Dose  . 0.9 %  sodium chloride infusion  100 mL Intravenous PRN Jay K. Allena Katz, MD      . 0.9 %  sodium chloride infusion  100 mL Intravenous PRN Vonna Kotyk K. Allena Katz, MD      . acetaminophen (TYLENOL) tablet 650 mg  650 mg Oral Q6H PRN Nelly Rout, MD      . diazepam (VALIUM) tablet 10 mg  10 mg Oral BID Nelly Rout, MD   10 mg at 09/12/12 0759  .  doxercalciferol (HECTOROL) injection 1 mcg  1 mcg Intravenous Q T,Th,Sa-HD Jay K. Allena Katz, MD   1 mcg at 09/09/12 0957  . feeding supplement (NEPRO CARB STEADY) liquid 237 mL  237 mL Oral PRN Nelly Rout, MD      . FLUoxetine (PROZAC) capsule 40 mg  40 mg Oral Daily Nehemiah Settle, MD   40 mg at 09/12/12 0757  . heparin injection 1,000 Units  1,000 Units Dialysis PRN Hartley Barefoot. Allena Katz, MD      . heparin injection 3,000 Units  3,000 Units Dialysis PRN Hartley Barefoot. Allena Katz, MD   3,000 Units at 09/11/12 1215  . HYDROmorphone (DILAUDID) tablet 2-4 mg  2-4 mg Oral TID PRN  Nelly Rout, MD   4 mg at 09/12/12 1435  . ibuprofen (ADVIL,MOTRIN) tablet 600 mg  600 mg Oral Q8H PRN Nelly Rout, MD      . insulin aspart (novoLOG) injection 0-15 Units  0-15 Units Subcutaneous TID WC Nelly Rout, MD   3 Units at 09/12/12 1212  . insulin glargine (LANTUS) injection 15 Units  15 Units Subcutaneous QHS Nelly Rout, MD   15 Units at 09/11/12 2106  . lidocaine (PF) (XYLOCAINE) 1 % injection 5 mL  5 mL Intradermal PRN Jay K. Allena Katz, MD      . lidocaine-prilocaine (EMLA) cream 1 application  1 application Topical PRN Vonna Kotyk K. Allena Katz, MD      . multivitamin (RENA-VIT) tablet 1 tablet  1 tablet Oral Daily Nelly Rout, MD   1 tablet at 09/12/12 0758  . nicotine (NICODERM CQ - dosed in mg/24 hours) 14 mg/24hr patch           . ondansetron (ZOFRAN-ODT) disintegrating tablet 4 mg  4 mg Oral Q6H PRN Nelly Rout, MD      . pantoprazole (PROTONIX) EC tablet 40 mg  40 mg Oral Daily Nelly Rout, MD   40 mg at 09/12/12 0758  . pentafluoroprop-tetrafluoroeth (GEBAUERS) aerosol 1 application  1 application Topical PRN Jay K. Allena Katz, MD      . sodium bicarbonate tablet 650 mg  650 mg Oral BID Marinda Elk, MD   650 mg at 09/12/12 0800  . sodium polystyrene (KAYEXALATE) 15 GM/60ML suspension 15 g  15 g Oral Once Marinda Elk, MD      . sucralfate (CARAFATE) tablet 1 g  1 g Oral TID [facility] Verne Spurr, PA-C   1 g at 09/12/12 1207  . traZODone (DESYREL) tablet 100 mg  100 mg Oral QHS Nehemiah Settle, MD   100 mg at 09/11/12 2106    Lab Results:  Results for orders placed during the hospital encounter of 09/06/12 (from the past 48 hour(s))  GLUCOSE, CAPILLARY     Status: Abnormal   Collection Time    09/10/12  4:50 PM      Result Value Range   Glucose-Capillary 176 (*) 70 - 99 mg/dL  GLUCOSE, CAPILLARY     Status: Abnormal   Collection Time    09/10/12  9:12 PM      Result Value Range   Glucose-Capillary 188 (*) 70 - 99 mg/dL  GLUCOSE, CAPILLARY     Status: None    Collection Time    09/11/12  6:31 AM      Result Value Range   Glucose-Capillary 98  70 - 99 mg/dL  CBC     Status: Abnormal   Collection Time    09/11/12  2:12 PM      Result  Value Range   WBC 12.1 (*) 4.0 - 10.5 K/uL   RBC 3.58 (*) 4.22 - 5.81 MIL/uL   Hemoglobin 11.3 (*) 13.0 - 17.0 g/dL   HCT 40.9 (*) 81.1 - 91.4 %   MCV 94.1  78.0 - 100.0 fL   MCH 31.6  26.0 - 34.0 pg   MCHC 33.5  30.0 - 36.0 g/dL   RDW 78.2  95.6 - 21.3 %   Platelets 171  150 - 400 K/uL   Comment: Performed at [facility]  RENAL FUNCTION PANEL     Status: Abnormal   Collection Time    09/11/12  2:12 PM      Result Value Range   Sodium 129 (*) 135 - 145 mEq/L   Potassium >7.5 (*) 3.5 - 5.1 mEq/L   Comment: NO VISIBLE HEMOLYSIS     REPEATED TO VERIFY     CRITICAL RESULT CALLED TO, READ BACK BY AND VERIFIED WITH:     TRANSU,B RN 09/11/12 1633 WOOTEN,K   Chloride 99  96 - 112 mEq/L   CO2 21  19 - 32 mEq/L   Glucose, Bld 107 (*) 70 - 99 mg/dL   BUN 50 (*) 6 - 23 mg/dL   Creatinine, Ser 0.86 (*) 0.50 - 1.35 mg/dL   Calcium 9.0  8.4 - 57.8 mg/dL   Phosphorus 0.7 (*) 2.3 - 4.6 mg/dL   Comment: CRITICAL RESULT CALLED TO, READ BACK BY AND VERIFIED WITH:     TRANSU,B RN 09/11/12 1633 WOOTEN,K   Albumin 3.2 (*) 3.5 - 5.2 g/dL   GFR calc non Af Amer 6 (*) >90 mL/min   GFR calc Af Amer 7 (*) >90 mL/min   Comment:            The eGFR has been calculated     using the CKD EPI equation.     This calculation has not been     validated in all clinical     situations.     eGFR's persistently     <90 mL/min signify     possible Chronic Kidney Disease.     Performed at [facility]    Physical Findings: AIMS: Facial and Oral Movements Muscles of Facial Expression: None, normal Lips and Perioral Area: None, normal Jaw: None, normal Tongue: None, normal,Extremity Movements Upper (arms, wrists, hands, fingers): None, normal Lower (legs, knees, ankles, toes): None, normal, Trunk Movements Neck,  shoulders, hips: None, normal, Overall Severity Severity of abnormal movements (highest score from questions above): None, normal Incapacitation due to abnormal movements: None, normal Patient's awareness of abnormal movements (rate only patient's report): No Awareness, Dental Status Current problems with teeth and/or dentures?: No Does patient usually wear dentures?: No  CIWA:    COWS:     Treatment Plan Summary: Daily contact with patient to assess and evaluate symptoms and progress in treatment Medication management  Plan:   Will continue current plan and treatment. Increase fluoxetine 40 mg a day for depression and anxiety Appreciate hospitalist consult for chronic renal failure Continue hemodialysis 3 times a week as scheduled Increase  Trazodone 100 mg PO Qhs/sleep and discontinue Ambien not helpful 1. Continue for crisis management and stabilization. 2. Medication management to reduce current symptoms to base line and improve the patient's overall level of functioning. 3. Due to his reports of continued depression, poor sleep, and suicidal ideation will add Abilify low dose to his current prozac for adjunctive effect. 3. Treat health problems as indicated. 4.  Develop treatment plan to decrease risk of relapse upon discharge and to reduce the need for readmission. 5. Psycho-social education regarding relapse prevention and self care. 6. Health care follow up as needed for medical problems. 7. Restart home medications where appropriate. 8. Patient informed of the long wait time for placement at [facility] and he is encouraged to participate in D/C planning groups in AM to discuss options with CM to develop a plan B.  Medical Decision Making Problem Points:  Established problem, worsening (2), Review of last therapy session (1) and Review of psycho-social stressors (1) Data Points:  Review or order clinical lab tests (1) Review of medication regiment & side effects (2) Review of new  medications or change in dosage (2)  I certify that inpatient services furnished can reasonably be expected to improve the patient's condition.  Rona Ravens. Diallo Ponder RPAC 3:28 PM 09/12/2012

## 2012-09-12 NOTE — Progress Notes (Signed)
Psychoeducational Group Note  Date: 09/12/2012 Time:  09/12/2012  Group Topic/Focus:  Gratefulness:  The focus of this group is to help patients identify what two things they are most grateful for in their lives. What helps ground them and to center them on their work to their recovery.  Participation Level:  Did Not Attend  Briar Sword A   

## 2012-09-12 NOTE — Progress Notes (Addendum)
CBG @ 2100=124 with no coverage

## 2012-09-12 NOTE — Progress Notes (Signed)
D  Pt states " I don't feel as bad as I did yesterday". He is UAL on the [address] hall, going to breakfast using his walker.  He takes his medications as ordered. HE is calm, pleasant and cooperative and he interacts with staff and peers appropriately.    A He does not attend his AM groups this morning and he is asleep in his bed af ter lunch saying " I need to rest". RUA fistula with palpable thrill and audible bruit.     R Safety is in place and POC maintained.

## 2012-09-12 NOTE — Progress Notes (Signed)
CBG at 1200 =156 and CBG at 1730=152.

## 2012-09-12 NOTE — Progress Notes (Signed)
Pt attended group 

## 2012-09-13 LAB — GLUCOSE, CAPILLARY
Glucose-Capillary: 172 mg/dL — ABNORMAL HIGH (ref 70–99)
Glucose-Capillary: 97 mg/dL (ref 70–99)
Glucose-Capillary: 124 mg/dL — ABNORMAL HIGH (ref 70–99)

## 2012-09-13 MED ORDER — ARIPIPRAZOLE 5 MG PO TABS: 5.0000 mg | ORAL_TABLET | Freq: Every day | ORAL | Status: DC

## 2012-09-13 MED ORDER — TRAZODONE HCL 100 MG PO TABS: 100.0000 mg | ORAL_TABLET | Freq: Every day | ORAL | Status: DC

## 2012-09-13 MED ORDER — DIAZEPAM 10 MG PO TABS: 10.0000 mg | ORAL_TABLET | Freq: Two times a day (BID) | ORAL | Status: DC

## 2012-09-13 MED ORDER — FLUOXETINE HCL 40 MG PO CAPS: 40.0000 mg | ORAL_CAPSULE | Freq: Every day | ORAL | Status: DC

## 2012-09-13 MED FILL — Doxercalciferol Inj 4 MCG/2ML (2 MCG/ML): INTRAVENOUS | Qty: 2 | Status: AC

## 2012-09-13 NOTE — Progress Notes (Signed)
Recreation Therapy Notes   Date: 08.11.2014 Time: 3:00pm Location: [address] Dayroom  Group Topic: Stress Management  Goal Area(s) Addresses:  Patient will verbalize importance of using healthy stress management.  Patient will identify stress management technique of choice.  Behavioral Response: Did not attend  Harol Shabazz L Rodderick Holtzer, LRT/CTRS  Britainy Kozub L 09/13/2012 4:18 PM 

## 2012-09-13 NOTE — Progress Notes (Signed)
D:  Per pt self inventory pt reports sleeping poorly, appetite improving, energy level low, ability to pay attention improving, rates depression at a 9 out of 10 and hopelessness at a 9 out of 10, endorses Passive SI, contracts for safety, denies HI/AVH, c/o pain     A:  Pain meds given prn per orders, Emotional support provided, Encouraged pt to continue with treatment plan and attend all group activities, q15 min checks maintained for safety.  R:  Pt is calm and cooperative, has been attending groups today, pt was going to be discharged today but before he was to be discharged pt states that he wants to try and find an assisted living facility to live in because PTA pt was homeless living in his car.  Made MD/NP and SW aware, discharge held for today.

## 2012-09-13 NOTE — Progress Notes (Signed)
Patient ID: Joe Vincent, male   DOB: 12-17-1960, 52 y.o.   MRN: [id number] D: Client is anxious with pressured speech, but is pleasant and cooperative with staff. Appearance is disheveled. Blood pressure increased this evening, systolic >180. Client complained of pain 8/10 "all over and all the time."   A: Client offered support and encouragement and encouraged to attend wrap up group. Client was given scheduled medications. Rechecked blood pressure, 120/59, 72, 18.   R: Client became anxious during recheck of vitals, "why are you doing this? I know there was something wrong." Informed client that his vitals are stable and the increase may have been attribute to anxiety or pain. Continues to endorse passive SI but contracts for safety with verbal agreement with staff.

## 2012-09-13 NOTE — [facility] Suicide Risk Assessment (Signed)
Suicide Risk Assessment  Discharge Assessment     Demographic Factors:  Male, Adolescent or young adult, Caucasian, Low socioeconomic status, Unemployed and homeless  Mental Status Per Nursing Assessment::   On Admission:  Suicidal ideation indicated by patient;Self-harm thoughts  Current Mental Status by Physician: Patient has no suicidal behaviors and gestures. he has participated on unit activites and hemodialysis and has no apparant distress.   Loss Factors: patient is awake, alert, oreiented x time, place, persons and situateion. Patient has normal psychomotor activity and participated on the unit activites and hemodialysis as scheduled three times a week. patient has no objective symptoms of depression and anxiety and subjectively rating high two weeks in a row, which indicated he has been malingering and receing secondary gains. he has no suicidal behaviors or gestures and does not appear in distress. he has multiple psychossocial stressess about his finances and living arrangements. he does not want to be placed in ALF even after repeating discussion. he has no suicidal or homicidal ideationa  or psychosis.   Historical Factors: Prior suicide attempts  Risk Reduction Factors:   NA and homeless and receives disability check and chosen to live in his car in [place].  Continued Clinical Symptoms:  Depression:   Recent sense of peace/wellbeing Alcohol/Substance Abuse/Dependencies Unstable or Poor Therapeutic Relationship Previous Psychiatric Diagnoses and Treatments Medical Diagnoses and Treatments/Surgeries  Cognitive Features That Contribute To Risk:  Closed-mindedness Polarized thinking    Suicide Risk:  Mild:  Suicidal ideation of limited frequency, intensity, duration, and specificity.  There are no identifiable plans, no associated intent, mild dysphoria and related symptoms, good self-control (both objective and subjective assessment), few other risk factors, and  identifiable protective factors, including available and accessible social support.  Discharge Diagnoses:   AXIS I:  Alcohol Abuse and Major Depression, Recurrent severe AXIS II:  Deferred AXIS III:   Past Medical History  Diagnosis Date  . Diabetes mellitus   . Hypertension   . GERD (gastroesophageal reflux disease)   . Retinopathy   . Peripheral neuropathy   . Chronic kidney disease   . Chest pain     a. 05/2011 Cath: nonobs dzs.  . Myocardial infarction 2000  . Depression   . Liver disease   . Cancer     Colon   AXIS IV:  housing problems, other psychosocial or environmental problems, problems related to social environment and problems with primary support group AXIS V:  61-70 mild symptoms  Plan Of Care/Follow-up recommendations:  Activity:  as tolerated Diet:  Regular  Is patient on multiple antipsychotic therapies at discharge:  No   Has Patient had three or more failed trials of antipsychotic monotherapy by history:  No  Recommended Plan for Multiple Antipsychotic Therapies: Not applicable  Nehemiah Settle., MD 09/13/2012, 11:03 AM

## 2012-09-13 NOTE — Progress Notes (Signed)
Adult Psychoeducational Group Note  Date:  09/13/2012 Time:  6:41 PM  Group Topic/Focus:  Self Care:   The focus of this group is to help patients understand the importance of self-care in order to improve or restore emotional, physical, spiritual, interpersonal, and financial health.  Participation Level:  Active  Participation Quality:  Appropriate, Attentive and Sharing  Affect:  Appropriate  Cognitive:  Appropriate  Insight: Appropriate and Good  Engagement in Group:  Developing/Improving and Engaged  Modes of Intervention:  Activity, Discussion, Education, Limit-setting and Socialization  Additional Comments: Joe Vincent attended group and participated and shared during group. Patient completed self assessment on the areas of wellness. Patient rated how well self care is in the areas of physical, spiritual, relationship, emotional and psychological care. Afterwards patient shared what were some weaknesses and strengths in the areas. Patient also was asked to set a goal on how improvement can be made in relation to self care. Patient was very attentive in group and shared many things with pain in his life and how is trying to manage it with his self care and staying positive.   Joe Vincent 09/13/2012, 6:41 PM

## 2012-09-13 NOTE — [facility] Group Notes (Signed)
[facility] LCSW Group Therapy  09/13/2012  1:15 PM   Type of Therapy:  Group Therapy  Participation Level:  Did Not Attend  Virgin Zellers Horton, LCSWA 09/13/2012 2:20 PM   

## 2012-09-13 NOTE — [facility] Group Notes (Signed)
[facility] LCSW Aftercare Discharge Planning Group Note   09/13/2012 8:45 AM  Participation Quality:  Did Not Attend   Radwan Cowley Horton, LCSWA 09/13/2012 9:46 AM    

## 2012-09-13 NOTE — [facility] Suicide Risk Assessment (Signed)
[facility] INPATIENT:  Family/Significant Other Suicide Prevention Education  Suicide Prevention Education:  Contact Attempts [name] - brother in law [phone]), (name of family member/significant other) has been identified by the patient as the family member/significant other with whom the patient will be residing, and identified as the person(s) who will aid the patient in the event of a mental health crisis.  With written consent from the patient, two attempts were made to provide suicide prevention education, prior to and/or following the patient's discharge.  We were unsuccessful in providing suicide prevention education.  A suicide education pamphlet was given to the patient to share with family/significant other.  Date and time of first attempt: 09/08/12 @ 11:13 am Date and time of second attempt: 09/13/12 @ 11:28 am  Horton, [name] 09/13/2012, 11:35 AM

## 2012-09-13 NOTE — Tx Team (Signed)
Interdisciplinary Treatment Plan Update (Adult)  Date: 09/13/2012  Time Reviewed:  9:45 AM  Progress in Treatment: Attending groups: Yes Participating in groups:  Yes Taking medication as prescribed:  Yes Tolerating medication:  Yes Family/Significant othe contact made: Yes Patient understands diagnosis:  Yes Discussing patient identified problems/goals with staff:  Yes Medical problems stabilized or resolved:  Yes Denies suicidal/homicidal ideation: Yes Issues/concerns per patient self-inventory:  Yes Other:  New problem(s) identified: N/A  Discharge Plan or Barriers: CSW assessing for appropriate referrals.  Pt initially declined referral for assisted living due to not wanting to give up his check but today when discussing discharge in treatment team he decided he now wants to go.  CSW will attempt to start this process today in anticipation of discharge tomorrow.    Reason for Continuation of Hospitalization: Anxiety Depression Suicidal Ideation Medication Stabilization  Comments: N/A  Estimated length of stay: 1 day, d/c tomorrow  For review of initial/current patient goals, please see plan of care.  Attendees: Patient:  Joe Vincent  09/13/2012 12:22 PM   Family:     Physician:  Dr. Javier Glazier 09/13/2012 12:20 PM   Nursing:   Dellia Cloud, RN 09/13/2012 12:20 PM   Clinical Social Worker:  Reyes Ivan, LCSWA 09/13/2012 12:20 PM   Other: Verne Spurr, PA 09/13/2012 12:20 PM   Other:  Frankey Shown, MA care coordination 09/13/2012 12:20 PM   Other:  Juline Patch, LCSW 09/13/2012 12:20 PM   Other:  Neill Loft, RN 09/13/2012 12:21 PM   Other: Quintella Reichert, RN 09/13/2012 12:22 PM   Other:    Other:    Other:    Other:    Other:     Scribe for Treatment Team:   Carmina Miller, 09/13/2012 12:20 PM

## 2012-09-13 NOTE — Discharge Summary (Signed)
Physician Discharge Summary Note  Patient:  Joe Vincent is an 52 y.o., male MRN:  [id number] DOB:  Dec 11, 1960 Patient phone:  [phone] (home)  Patient address:   [address] [place] [address],   Date of Admission:  09/06/2012 Date of Discharge:09/14/2012  Reason for Admission:  Suicidal ideation  Discharge Diagnoses: Principal Problem:   MDD (major depressive disorder), recurrent severe, without psychosis Active Problems:   ESRD needing dialysis   Hyperkalemia   Hyponatremia   Suicide  Review of Systems  Constitutional: Negative.  Negative for fever, chills, weight loss, malaise/fatigue and diaphoresis.  HENT: Negative for congestion and sore throat.   Eyes: Negative for blurred vision, double vision and photophobia.  Respiratory: Negative for cough, shortness of breath and wheezing.   Cardiovascular: Negative for chest pain, palpitations and PND.  Gastrointestinal: Negative for heartburn, nausea, vomiting, abdominal pain, diarrhea and constipation.  Musculoskeletal: Negative for myalgias, joint pain and falls.  Neurological: Negative for dizziness, tingling, tremors, sensory change, speech change, focal weakness, seizures, loss of consciousness, weakness and headaches.  Endo/Heme/Allergies: Negative for polydipsia. Does not bruise/bleed easily.  Psychiatric/Behavioral: Negative for depression, suicidal ideas, hallucinations, memory loss and substance abuse. The patient is not nervous/anxious and does not have insomnia.   Discharge Diagnoses:  AXIS I: Alcohol Abuse and Major Depression, Recurrent severe  AXIS II: Deferred  AXIS III:  Past Medical History   Diagnosis  Date   .  Diabetes mellitus    .  Hypertension    .  GERD (gastroesophageal reflux disease)    .  Retinopathy    .  Peripheral neuropathy    .  Chronic kidney disease    .  Chest pain      a. 05/2011 Cath: nonobs dzs.   .  Myocardial infarction  2000   .  Depression    .  Liver disease    .  Cancer       Colon    AXIS IV: housing problems, other psychosocial or environmental problems, problems related to social environment and problems with primary support group  AXIS V: 61-70 mild symptoms     Level of Care:  OP  Hospital Course:  This was a voluntary admission for Tory who is a homeless 52 year old male on dialysis. He had been living in his car. He presented to [facility] for suicidal ideation with a plan of overdose. He had presented to [facility] in [place] and was referred to [facility] ED to wait for placement at the [facility] facility for mental health without success. He was transferred to [facility] for Dialysis where he reported suicidal ideation.  He was evaluated and felt to be in need of acute psychiatric hospitalization and stabilization.      Upon arrival at the unit he was evaluated and his symptoms were identified as reported.  He stated that he suffered from depression because he is homeless. He declined assisted living stating that he did not want to give up his disability funds.  He could not stay with friends due to being around alcohol and drugs, his brother recently got married and he was unable to stay with him.       He reported a positive list of symptoms including anhedonia, increasingly depressed mood, insomnia, fatigue, feelings of guilt and worthlessness, difficulty concentrating, impaired memory, thoughts of death, suicidal ideation and previous suicide attempt.     The patient was oriented to the unit and encouraged to participate in unit programming. Medical  problems were identified and treated appropriately. Home medication was restarted as needed. Psychiatric medication management was initiated. He was able to continue his dialysis as planned.        Malikye  the was evaluated each day by a clinical provider to ascertain the patient's response to treatment.  Improvement was noted by the patient's report of decreasing symptoms, improved sleep and appetite, affect, medication  tolerance, behavior, and participation in unit programming.  He was asked each day to complete a self inventory noting mood, mental status, pain, new symptoms, anxiety and concerns. Each day he reported the maximum numbers on depression and anxiety, which were incongruent with his behavior, affect, appetite, and mood as observed by providers.        He often asked for medication for sleep, stated that he couldn't sleep, but was documented to be sleeping well on average of 5-6 hours per night. He refused to attend dialysis one day stating that he needed to sleep, but did go that same afternoon. He stated he wanted to be admitted to [facility] for long term care.  When informed of the long wait list for [facility], he stated that he would leave and go directly to the hospital where he could get the care he needed.       Fay was felt to be malingering in order to obtain housing. He then planned to discuss going into a group home with his roommate who was evaluated for placement in a facility while here.       By the day of discharge the patient was in much improved condition than upon admission.  Symptoms were reported as significantly decreased or resolved completely.  The patient denied SI/HI and voiced no AVH. He reported that he was motivated to continue taking medication with a goal of continued improvement in mental health.  Dermot was discharged with a plan to follow up with the group home when a bed was available.  Consults:  Internal medicine  Significant Diagnostic Studies:  None  Discharge Vitals:   Blood pressure 90/49, pulse 75, temperature 98 F (36.7 C), temperature source Oral, resp. rate 18, height 5' 9.25" (1.759 m), weight 61.4 kg (135 lb 5.8 oz), SpO2 98.00%. Body mass index is 19.84 kg/(m^2). Lab Results:   Results for orders placed during the hospital encounter of 09/06/12 (from the past 72 hour(s))  GLUCOSE, CAPILLARY     Status: Abnormal   Collection Time    09/10/12 11:47 AM      Result  Value Range   Glucose-Capillary 216 (*) 70 - 99 mg/dL  GLUCOSE, CAPILLARY     Status: Abnormal   Collection Time    09/10/12  4:50 PM      Result Value Range   Glucose-Capillary 176 (*) 70 - 99 mg/dL  GLUCOSE, CAPILLARY     Status: Abnormal   Collection Time    09/10/12  9:12 PM      Result Value Range   Glucose-Capillary 188 (*) 70 - 99 mg/dL  GLUCOSE, CAPILLARY     Status: None   Collection Time    09/11/12  6:31 AM      Result Value Range   Glucose-Capillary 98  70 - 99 mg/dL  CBC     Status: Abnormal   Collection Time    09/11/12  2:12 PM      Result Value Range   WBC 12.1 (*) 4.0 - 10.5 K/uL   RBC 3.58 (*) 4.22 - 5.81 MIL/uL  Hemoglobin 11.3 (*) 13.0 - 17.0 g/dL   HCT 16.1 (*) 09.6 - 04.5 %   MCV 94.1  78.0 - 100.0 fL   MCH 31.6  26.0 - 34.0 pg   MCHC 33.5  30.0 - 36.0 g/dL   RDW 40.9  81.1 - 91.4 %   Platelets 171  150 - 400 K/uL   Comment: Performed at [facility]  RENAL FUNCTION PANEL     Status: Abnormal   Collection Time    09/11/12  2:12 PM      Result Value Range   Sodium 129 (*) 135 - 145 mEq/L   Potassium >7.5 (*) 3.5 - 5.1 mEq/L   Comment: NO VISIBLE HEMOLYSIS     REPEATED TO VERIFY     CRITICAL RESULT CALLED TO, READ BACK BY AND VERIFIED WITH:     TRANSU,B RN 09/11/12 1633 WOOTEN,K   Chloride 99  96 - 112 mEq/L   CO2 21  19 - 32 mEq/L   Glucose, Bld 107 (*) 70 - 99 mg/dL   BUN 50 (*) 6 - 23 mg/dL   Creatinine, Ser 7.82 (*) 0.50 - 1.35 mg/dL   Calcium 9.0  8.4 - 95.6 mg/dL   Phosphorus 0.7 (*) 2.3 - 4.6 mg/dL   Comment: CRITICAL RESULT CALLED TO, READ BACK BY AND VERIFIED WITH:     TRANSU,B RN 09/11/12 1633 WOOTEN,K   Albumin 3.2 (*) 3.5 - 5.2 g/dL   GFR calc non Af Amer 6 (*) >90 mL/min   GFR calc Af Amer 7 (*) >90 mL/min   Comment:            The eGFR has been calculated     using the CKD EPI equation.     This calculation has not been     validated in all clinical     situations.     eGFR's persistently     <90 mL/min signify      possible Chronic Kidney Disease.     Performed at [facility]  GLUCOSE, CAPILLARY     Status: Abnormal   Collection Time    09/11/12  6:53 PM      Result Value Range   Glucose-Capillary 172 (*) 70 - 99 mg/dL  GLUCOSE, CAPILLARY     Status: Abnormal   Collection Time    09/11/12  9:15 PM      Result Value Range   Glucose-Capillary 163 (*) 70 - 99 mg/dL   Comment 1 Notify RN    GLUCOSE, CAPILLARY     Status: None   Collection Time    09/12/12  6:10 AM      Result Value Range   Glucose-Capillary 97  70 - 99 mg/dL   Comment 1 Notify RN    GLUCOSE, CAPILLARY     Status: Abnormal   Collection Time    09/12/12  9:31 PM      Result Value Range   Glucose-Capillary 124 (*) 70 - 99 mg/dL  GLUCOSE, CAPILLARY     Status: Abnormal   Collection Time    09/13/12  6:07 AM      Result Value Range   Glucose-Capillary 149 (*) 70 - 99 mg/dL    Physical Findings: AIMS: Facial and Oral Movements Muscles of Facial Expression: None, normal Lips and Perioral Area: None, normal Jaw: None, normal Tongue: None, normal,Extremity Movements Upper (arms, wrists, hands, fingers): None, normal Lower (legs, knees, ankles, toes): None, normal, Trunk Movements Neck, shoulders, hips: None,  normal, Overall Severity Severity of abnormal movements (highest score from questions above): None, normal Incapacitation due to abnormal movements: None, normal Patient's awareness of abnormal movements (rate only patient's report): No Awareness, Dental Status Current problems with teeth and/or dentures?: No Does patient usually wear dentures?: No  CIWA:    COWS:     Psychiatric Specialty Exam: See Psychiatric Specialty Exam and Suicide Risk Assessment completed by Attending Physician prior to discharge.  Discharge destination:  Home  Is patient on multiple antipsychotic therapies at discharge:  No   Has Patient had three or more failed trials of antipsychotic monotherapy by history:  No  Recommended Plan  for Multiple Antipsychotic Therapies: NA   Discharge Orders   Future Orders Complete By Expires     Diet -renal diet As directed     Discharge instructions  As directed     Comments:      Take all of your medications as directed. Be sure to keep all of your follow up appointments.  If you are unable to keep your follow up appointment, call your Doctor's office to let them know, and reschedule.  Make sure that you have enough medication to last until your appointment. Be sure to get plenty of rest. Going to bed at the same time each night will help. Try to avoid sleeping during the day.  Increase your activity as tolerated. Regular exercise will help you to sleep better and improve your mental health. Eating a heart healthy diet is recommended. Try to avoid salty or fried foods. Be sure to avoid all alcohol and illegal drugs.    Increase activity slowly  As directed         Medication List    STOP taking these medications       amLODipine 10 MG tablet  Commonly known as:  NORVASC     citalopram 20 MG tablet  Commonly known as:  CELEXA      TAKE these medications     Indication   ARIPiprazole 5 MG tablet  Commonly known as:  ABILIFY  Take 1 tablet (5 mg total) by mouth daily.   Indication:  Major Depressive Disorder     diazepam 10 MG tablet  Commonly known as:  VALIUM  Take 1 tablet (10 mg total) by mouth 2 (two) times daily.   Indication:  Feeling Anxious     FLUoxetine 40 MG capsule  Commonly known as:  PROZAC  Take 1 capsule (40 mg total) by mouth daily.   Indication:  Depression     HYDROmorphone 4 MG tablet  Commonly known as:  DILAUDID  Take 1 tablet (4 mg total) by mouth 3 (three) times daily as needed for pain.    pain   insulin aspart 100 UNIT/ML injection  Commonly known as:  novoLOG  Inject 4-12 Units into the skin 3 (three) times daily as needed for high blood sugar (Uses a sliding scale at home).    Type 2 DM   insulin glargine 100 UNIT/ML injection   Commonly known as:  LANTUS  Inject 15 Units into the skin at bedtime.    Type two diabetes   multivitamin Tabs tablet  Take 1 tablet by mouth daily.    nutritional supplement   pantoprazole 40 MG tablet  Commonly known as:  PROTONIX  Take 40 mg by mouth daily.    GERD   sucralfate 1 G tablet  Commonly known as:  CARAFATE  Take 1 g by mouth 4 (four)  times daily -  with meals and at bedtime.    esophagitis   traZODone 100 MG tablet  Commonly known as:  DESYREL  Take 1 tablet (100 mg total) by mouth at bedtime.   Indication:  insomnia           Follow-up Information   Follow up with Arna Medici.   Contact information:   Physical Address: [address] [place], [place] [address]  Mailing Address:  [address] [place], [place] [address] Phone: [phone] Fax: [phone]      Follow-up recommendations:   Activities: Resume activity as tolerated. Diet: Heart healthy low sodium diet Tests: Follow up testing will be determined by your out patient provider. Comments:   Total Discharge Time:  Greater than 30 minutes.  Signed: Rona Ravens. M[place]burn RPAC 10:29 AM 09/14/2012  Patient was evaluated, developed treatment and case discussed with treatment team. Reviewed the information documented and agree with the treatment plan.   Rebekkah Powless,JANARDHAHA R. 09/17/2012 8:55 PM

## 2012-09-13 NOTE — Progress Notes (Signed)
D.  Pt. Reports passive SI.  Pt. Reports depressed and that he is homeless and lives in his car. Denies HI and A/V hallucinations.  Pt. Interacting appropriately with peers and staff. A.  Pt. Encouraged to work on his coping skills and attend groups. R.  Pt. Remains safe.

## 2012-09-14 ENCOUNTER — Ambulatory Visit (HOSPITAL_COMMUNITY): Admission: RE | Admit: 2012-09-14 | Payer: Medicare Other | Source: Ambulatory Visit | Admitting: Nephrology

## 2012-09-14 ENCOUNTER — Encounter (HOSPITAL_COMMUNITY): Payer: Self-pay | Admitting: Nephrology

## 2012-09-14 DIAGNOSIS — F1010 Alcohol abuse, uncomplicated: Secondary | ICD-10-CM

## 2012-09-14 LAB — GLUCOSE, CAPILLARY: Glucose-Capillary: 154 mg/dL — ABNORMAL HIGH (ref 70–99)

## 2012-09-14 NOTE — Progress Notes (Signed)
[facility] Adult Case Management Discharge Plan :  Will you be returning to the same living situation after discharge: Yes,  pt was homeless prior to admission.  Pt voices d/c plan to remain homeless and wait until a bed is available at an unlicensed group home he is interested in living in At discharge, do you have transportation home?:Yes,  provided cab voucher to get home Do you have the ability to pay for your medications:Yes,  access to meds  Release of information consent forms completed and in the chart;  Patient's signature needed at discharge.  Patient to Follow up at: Follow-up Information   Follow up with Arna Medici On 09/15/2012. (Walk in on this date for hospital discharge appointment, for medication management and therapy.  Walk in Monday - Friday 8 am - 11:30 am)    Contact information:   Physical Address: [address] [place], [place] [address]  Mailing Address:  [address] [place], [place] [address] Phone: [phone] Fax: [phone]      Patient denies SI/HI:   Yes,  denies SI/HI    Safety Planning and Suicide Prevention discussed:  Yes,  discussed with pt. Attempts made to contact pt's brother in law with no luck.  See suicide prevention education note.   Pt was offered assisted living referral early during pt's hospitalization and pt initially said he wasn't willing to give up his disability check or his car.  Pt was then told he would d/c yesterday and pt than changed his mind about referral for assisted living yesterday.  Pt was interviewed by an unlicensed group home yesterday and plans to live there once a bed opens up.  Pt not open to any other referrals.  Pt verbalizes a plan to remain homeless until he gets into this group home.  No further needs voiced by pt at this time.    Carmina Miller 09/14/2012, 9:37 AM

## 2012-09-14 NOTE — Clinical Social Work Note (Signed)
Pt did decide to go into a group home yesterday, when he was told he would be d/c.  CSW secured placement at [facility] group home in [place].  FL2 was signed by the doctor and faxed to Jenene Slicker [phone]).  See chart for fax confirmation and FL2.  Pt states that he will collect his belongings and car from [facility] today and go to the group home after.  No further needs voiced by pt at this time.    Reyes Ivan, LCSWA 09/14/2012  1:11 PM

## 2012-09-14 NOTE — Progress Notes (Signed)
Agree with assessment and plan Amamda Curbow A. Shafiq Larch, M.D. 

## 2012-09-14 NOTE — Progress Notes (Signed)
Patient ID: Joe Vincent, male   DOB: February 23, 1960, 52 y.o.   MRN: [id number] D:  Patient discharged to home.  All belongings retrieved from locker and from the search room.  He was living in his car prior to admission here at [facility].  Prior to coming here he was inpatient at [facility].  He was given a cab voucher to go back to [facility] to collect his vehicle.   A:  Reviewed all discharge instructions, medications, and follow up care.  Patient was given a three day supply of his psych medications from the hospital pharmacy.  He was also given prescriptions for renewals of all psych medicines.  Escorted patient off the unit.  [employer] company was called to transport him to [facility].  He was provided with a cab voucher as well.   R:  Patient verbalized understanding of all discharge instructions.  He denies suicidal ideation at this time.  He was escorted to the search room and then to the lobby where the cab was waiting to pick him up.

## 2012-09-14 NOTE — Clinical Social Work Note (Signed)
CSW received a call from the taxi driver at this time.  Taxi driver states that when they went to pick pt up he said he was to be taken to [facility], not [facility].  CSW could hear pt in the background shouting that he has an appointment at [facility] he needs to get to.  CSW explained that pt told CSW his car was parked at [facility] and that's what the voucher was good for only.  Pt then told the taxi driver that his brother in law recently had his car moved to [place] so it is no longer at [employer].  CSW authorized the cab company to change the estimate from $49 (to [facility]) to now $70 (to [place]).   Reyes Ivan, LCSWA 09/14/2012  3:01 PM

## 2012-09-14 NOTE — Progress Notes (Signed)
The focus of this group is to educate the patient on the purpose and policies of crisis stabilization and provide a format to answer questions about their admission.  The group details unit policies and expectations of patients while admitted. Patient did not attend. 

## 2012-09-14 NOTE — Progress Notes (Addendum)
Patient is scheduled for dialysis today.  He states he does not feel well and does not want to attend.  Dr. Shela Commons is aware and Genevie Cheshire at dialysis unit has been notified as well.  Also notified social worker, Carolan Shiver, at [facility] that patient is being discharged today and is not having a treatment prior to discharge.

## 2012-09-14 NOTE — Progress Notes (Signed)
Agree with assessment and plan Fairley Copher A. Fleeta Kunde, M.D. 

## 2012-09-14 NOTE — Progress Notes (Signed)
D: Patient resting in bed with eyes closed.  Respirations even and unlabored.  Patient appears to be in no apparent distress. A: Staff to monitor Q 15 mins for safety.   R:Patient remains safe on the unit.  

## 2012-09-14 NOTE — [facility] Suicide Risk Assessment (Signed)
Suicide Risk Assessment   Discharge Assessment  Demographic Factors:  Male, Adolescent or young adult, Caucasian, Low socioeconomic status, Unemployed and homeless  Mental Status Per Nursing Assessment::  On Admission: Suicidal ideation indicated by patient;Self-harm thoughts  Current Mental Status by Physician:  Patient has no suicidal behaviors and gestures. he has participated on unit activites and hemodialysis and has no apparant distress.  Loss Factors:  patient is awake, alert, oreiented x time, place, persons and situateion. Patient has normal psychomotor activity and participated on the unit activites and hemodialysis as scheduled three times a week. patient has no objective symptoms of depression and anxiety and subjectively rating high two weeks in a row, which indicated he has been malingering and receing secondary gains. he has no suicidal behaviors or gestures and does not appear in distress. he has multiple psychossocial stressess about his finances and living arrangements. he does not want to be placed in ALF even after repeating discussion. he has no suicidal or homicidal ideationa or psychosis.  Historical Factors:  Prior suicide attempts  Risk Reduction Factors:  NA and homeless and receives disability check and chosen to live in his car in [place].  Continued Clinical Symptoms:  Depression: Recent sense of peace/wellbeing  Alcohol/Substance Abuse/Dependencies  Unstable or Poor Therapeutic Relationship  Previous Psychiatric Diagnoses and Treatments  Medical Diagnoses and Treatments/Surgeries   Cognitive Features That Contribute To Risk:  Closed-mindedness  Polarized thinking   Suicide Risk:  Mild: Suicidal ideation of limited frequency, intensity, duration, and specificity. There are no identifiable plans, no associated intent, mild dysphoria and related symptoms, good self-control (both objective and subjective assessment), few other risk factors, and identifiable  protective factors, including available and accessible social support.   Discharge Diagnoses:  AXIS I: Alcohol Abuse and Major Depression, Recurrent severe  AXIS II: Deferred  AXIS III:  Past Medical History   Diagnosis  Date   .  Diabetes mellitus    .  Hypertension    .  GERD (gastroesophageal reflux disease)    .  Retinopathy    .  Peripheral neuropathy    .  Chronic kidney disease    .  Chest pain      a. 05/2011 Cath: nonobs dzs.   .  Myocardial infarction  2000   .  Depression    .  Liver disease    .  Cancer      Colon    AXIS IV: housing problems, other psychosocial or environmental problems, problems related to social environment and problems with primary support group  AXIS V: 61-70 mild symptoms   Plan Of Care/Follow-up recommendations:  Activity: as tolerated  Diet: Regular   Is patient on multiple antipsychotic therapies at discharge: No  Has Patient had three or more failed trials of antipsychotic monotherapy by history: No  Recommended Plan for Multiple Antipsychotic Therapies:  Not applicable  Damieon Armendariz,JANARDHAHA R. 09/14/2012, 12:17 PM

## 2012-09-15 DIAGNOSIS — M6281 Muscle weakness (generalized): Secondary | ICD-10-CM

## 2012-09-15 NOTE — Progress Notes (Signed)
Patient Discharge Instructions:  After Visit Summary (AVS):   Faxed to:  09/15/12 Discharge Summary Note:   Faxed to:  09/15/12 Psychiatric Admission Assessment Note:   Faxed to:  09/15/12 Suicide Risk Assessment - Discharge Assessment:   Faxed to:  09/15/12 Faxed/Sent to the Next Level Care provider:  09/15/12 Faxed to [facility] @ [phone]  Jerelene Redden, 09/15/2012, 4:22 PM

## 2012-09-26 DIAGNOSIS — R5381 Other malaise: Secondary | ICD-10-CM

## 2012-09-26 DIAGNOSIS — R5383 Other fatigue: Secondary | ICD-10-CM

## 2012-09-26 DIAGNOSIS — R1110 Vomiting, unspecified: Secondary | ICD-10-CM

## 2012-09-27 DIAGNOSIS — R011 Cardiac murmur, unspecified: Secondary | ICD-10-CM

## 2013-01-31 ENCOUNTER — Other Ambulatory Visit (HOSPITAL_COMMUNITY): Payer: Self-pay | Admitting: Nephrology

## 2013-01-31 DIAGNOSIS — T82868A Thrombosis of vascular prosthetic devices, implants and grafts, initial encounter: Secondary | ICD-10-CM

## 2013-01-31 DIAGNOSIS — N186 End stage renal disease: Secondary | ICD-10-CM

## 2013-02-01 ENCOUNTER — Other Ambulatory Visit: Payer: Self-pay | Admitting: Radiology

## 2013-02-02 ENCOUNTER — Encounter (HOSPITAL_COMMUNITY): Payer: Self-pay

## 2013-02-02 ENCOUNTER — Ambulatory Visit (HOSPITAL_COMMUNITY)
Admission: RE | Admit: 2013-02-02 | Discharge: 2013-02-02 | Disposition: A | Discharge status: Home / Self Care | Payer: Medicare Other | Source: Ambulatory Visit | Attending: Nephrology | Admitting: Nephrology

## 2013-02-02 ENCOUNTER — Other Ambulatory Visit (HOSPITAL_COMMUNITY): Payer: Self-pay | Admitting: Nephrology

## 2013-02-02 VITALS — BP 120/73 | HR 91 | Resp 16

## 2013-02-02 DIAGNOSIS — I2510 Atherosclerotic heart disease of native coronary artery without angina pectoris: Secondary | ICD-10-CM | POA: Insufficient documentation

## 2013-02-02 DIAGNOSIS — Z992 Dependence on renal dialysis: Secondary | ICD-10-CM | POA: Insufficient documentation

## 2013-02-02 DIAGNOSIS — N186 End stage renal disease: Secondary | ICD-10-CM | POA: Insufficient documentation

## 2013-02-02 DIAGNOSIS — Y832 Surgical operation with anastomosis, bypass or graft as the cause of abnormal reaction of the patient, or of later complication, without mention of misadventure at the time of the procedure: Secondary | ICD-10-CM | POA: Insufficient documentation

## 2013-02-02 DIAGNOSIS — I871 Compression of vein: Secondary | ICD-10-CM | POA: Insufficient documentation

## 2013-02-02 DIAGNOSIS — T82868A Thrombosis of vascular prosthetic devices, implants and grafts, initial encounter: Secondary | ICD-10-CM

## 2013-02-02 DIAGNOSIS — H3500 Unspecified background retinopathy: Secondary | ICD-10-CM | POA: Insufficient documentation

## 2013-02-02 DIAGNOSIS — I82290 Acute embolism and thrombosis of other thoracic veins: Secondary | ICD-10-CM | POA: Insufficient documentation

## 2013-02-02 DIAGNOSIS — K219 Gastro-esophageal reflux disease without esophagitis: Secondary | ICD-10-CM | POA: Insufficient documentation

## 2013-02-02 DIAGNOSIS — Z5309 Procedure and treatment not carried out because of other contraindication: Secondary | ICD-10-CM | POA: Insufficient documentation

## 2013-02-02 DIAGNOSIS — T82898A Other specified complication of vascular prosthetic devices, implants and grafts, initial encounter: Secondary | ICD-10-CM | POA: Insufficient documentation

## 2013-02-02 DIAGNOSIS — F17200 Nicotine dependence, unspecified, uncomplicated: Secondary | ICD-10-CM | POA: Insufficient documentation

## 2013-02-02 DIAGNOSIS — I252 Old myocardial infarction: Secondary | ICD-10-CM | POA: Insufficient documentation

## 2013-02-02 DIAGNOSIS — G609 Hereditary and idiopathic neuropathy, unspecified: Secondary | ICD-10-CM | POA: Insufficient documentation

## 2013-02-02 DIAGNOSIS — E119 Type 2 diabetes mellitus without complications: Secondary | ICD-10-CM | POA: Insufficient documentation

## 2013-02-02 DIAGNOSIS — Z85038 Personal history of other malignant neoplasm of large intestine: Secondary | ICD-10-CM | POA: Insufficient documentation

## 2013-02-02 MED ORDER — ALTEPLASE 100 MG IV SOLR
4.0000 mg | Freq: Once | INTRAVENOUS | Status: DC
  Filled 2013-02-02: qty 4

## 2013-02-02 MED ORDER — ALTEPLASE 100 MG IV SOLR
2.0000 mg | Freq: Once | INTRAVENOUS | Status: DC
  Filled 2013-02-02: qty 2

## 2013-02-02 MED ORDER — SODIUM CHLORIDE 0.9 % IV SOLN
INTRAVENOUS | Status: DC | PRN
  Administered 2013-02-02: 30 mL/h via INTRAVENOUS

## 2013-02-02 MED ORDER — FENTANYL CITRATE 0.05 MG/ML IJ SOLN
INTRAMUSCULAR | Status: DC | PRN
  Administered 2013-02-02: 50 ug via INTRAVENOUS

## 2013-02-02 MED ORDER — HEPARIN SODIUM (PORCINE) 1000 UNIT/ML IJ SOLN
INTRAMUSCULAR | Status: AC
  Filled 2013-02-02: qty 1

## 2013-02-02 MED ORDER — ALTEPLASE 100 MG IV SOLR
4.0000 mg | Freq: Once | INTRAVENOUS | Status: AC
  Administered 2013-02-02: 4 mg
  Filled 2013-02-02: qty 4

## 2013-02-02 MED ORDER — FENTANYL CITRATE 0.05 MG/ML IJ SOLN
INTRAMUSCULAR | Status: AC
  Filled 2013-02-02: qty 4

## 2013-02-02 MED ORDER — MIDAZOLAM HCL 2 MG/2ML IJ SOLN
INTRAMUSCULAR | Status: AC
  Filled 2013-02-02: qty 4

## 2013-02-02 MED ORDER — MIDAZOLAM HCL 2 MG/2ML IJ SOLN
INTRAMUSCULAR | Status: DC | PRN
  Administered 2013-02-02: 1 mg via INTRAVENOUS

## 2013-02-02 MED ORDER — IOHEXOL 300 MG/ML  SOLN
150.0000 mL | Freq: Once | INTRAMUSCULAR | Status: AC | PRN
  Administered 2013-02-02: 20 mL via INTRAVENOUS

## 2013-02-02 NOTE — H&P (Signed)
Very low probability of success given fistula thrombosed x 3 weeks.  [place] evaluation suggests that there is at least some small chance of salvage.  Will attempt declot.   Signed,  Sterling Big, MD [facility]iology

## 2013-02-02 NOTE — ED Notes (Signed)
MD at bedside.  Dr Archer Asa explaining reason for declot failure.  Pt verbalizes understanding.

## 2013-02-02 NOTE — H&P (Signed)
Joe Vincent is an 52 y.o. male.   Chief Complaint: scheduled for possible Rt arm dialysis fistula thrombolysis and angioplasty/stent placement. Possible removal L IJ perm cath  Pt not sure exactly when fistula was placed.  He thinks" just a month ago" Not sure of last use. He thinks "a week ago" RN at Dialysis center confirms last use of fistula 3 weeks ago Her notes say fistula was placed 12/03/11 --?what facility Has been using Perm Cath x 3 weeks- PC was placed while in [facility] for hyperkalemia and pneumonia then  HPI: DM; HTN; ESRD; retinopathy; CAD/MI; colon ca  Past Medical History  Diagnosis Date  . Diabetes mellitus   . Hypertension   . ESRD on hemodialysis     On HD in [place] at [facility] unit.  Nephrologist is Dr. Fausto Skillern. Gets HD on TTS schedule.   [name] Retinopathy   . Peripheral neuropathy   . Chest pain     a. 05/2011 Cath: nonobs dzs.  . Myocardial infarction 2000  . Depression   . Liver disease   . Cancer     Colon  . GERD (gastroesophageal reflux disease)     Past Surgical History  Procedure Laterality Date  . Colostomy  Ileostomy    colon cancer  . Amputation      left pinky toe  . Esophagogastroduodenoscopy  05/23/2011    Procedure: ESOPHAGOGASTRODUODENOSCOPY (EGD);  Surgeon: Theda Belfast, MD;  Location: [facility] ENDOSCOPY;  Service: Endoscopy;  Laterality: N/A;  . Colon surgery  2009  . Cardiac catheterization  2012  . Eye surgery      Family History  Problem Relation Age of Onset  . Cancer Mother   . Heart disease Father   . Cancer Father    Social History:  reports that he has been smoking Cigarettes and Cigars.  He has a 7.5 pack-year smoking history. He has never used smokeless tobacco. He reports that he does not drink alcohol or use illicit drugs.  Allergies:  Allergies  Allergen Reactions  . Codeine Swelling and Rash     (Not in a hospital admission)  No results found for this or any previous visit (from the past 48 hour(s)). No  results found.  Review of Systems  Constitutional: Negative for fever.  Respiratory: Negative for cough and shortness of breath.   Cardiovascular: Negative for chest pain.  Gastrointestinal: Negative for nausea and vomiting.  Neurological: Negative for weakness.  Psychiatric/Behavioral: Positive for depression, suicidal ideas and substance abuse.    Blood pressure 103/66, resp. rate 21, SpO2 100.00%. Physical Exam  Constitutional: He is oriented to person, place, and time. He appears well-developed.  Cardiovascular: Normal rate, regular rhythm and normal heart sounds.   No murmur heard. Respiratory: Effort normal and breath sounds normal. He has no wheezes.  GI: Soft. Bowel sounds are normal. There is no tenderness.  Musculoskeletal: Normal range of motion.  Rt upper arm dialysis fistula- no pulse or thrill  Neurological: He is alert and oriented to person, place, and time.  Skin: Skin is warm and dry.  Psychiatric: He has a normal mood and affect. His behavior is normal. Judgment and thought content normal.     Assessment/Plan Clotted Rt upper arm dialysis fistula Last use 3 weeks ago per RN at dialysis center in [facility] in place per [facility] 3 weeks ago Pt scheduled now for possible thrombolysis and poss pta/stent. Possible PC removal Pt aware of procedure benefits and risks and agreeable to  proceed Consent signed and in chart  Kadisha Goodine A 02/02/2013, 8:24 AM

## 2013-02-02 NOTE — ED Notes (Signed)
O2 2l/[place] applied 

## 2013-02-02 NOTE — Procedures (Signed)
Interventional Radiology Procedure Note  Procedure: 1.) Central and right upper extremity venogram 2.) Aborted declot of chronically occluded RUE fistula Complications: None immediate Recommendations: - Continue dialysis through existing LEFT hemodialysis catheter - Refer to vascular surgery to evaluate for new dialysis access creation  Signed,  Sterling Big, MD [facility] Radiology

## 2013-02-04 ENCOUNTER — Other Ambulatory Visit: Payer: Self-pay | Admitting: *Deleted

## 2013-02-04 DIAGNOSIS — Z01810 Encounter for preprocedural cardiovascular examination: Secondary | ICD-10-CM

## 2013-02-04 DIAGNOSIS — N186 End stage renal disease: Secondary | ICD-10-CM

## 2013-02-25 ENCOUNTER — Encounter: Payer: Self-pay | Admitting: Surgery

## 2013-02-28 ENCOUNTER — Other Ambulatory Visit: Payer: Self-pay

## 2013-02-28 ENCOUNTER — Ambulatory Visit (INDEPENDENT_AMBULATORY_CARE_PROVIDER_SITE_OTHER): Payer: Medicare Other | Admitting: Surgery

## 2013-02-28 ENCOUNTER — Encounter: Payer: Self-pay | Admitting: Surgery

## 2013-02-28 ENCOUNTER — Ambulatory Visit (HOSPITAL_COMMUNITY)
Admission: RE | Admit: 2013-02-28 | Discharge: 2013-02-28 | Disposition: A | Discharge status: Home / Self Care | Payer: Medicare Other | Source: Ambulatory Visit | Attending: Surgery | Admitting: Surgery

## 2013-02-28 ENCOUNTER — Ambulatory Visit (INDEPENDENT_AMBULATORY_CARE_PROVIDER_SITE_OTHER)
Admission: RE | Admit: 2013-02-28 | Discharge: 2013-02-28 | Disposition: A | Discharge status: Home / Self Care | Payer: Medicare Other | Source: Ambulatory Visit | Attending: Surgery | Admitting: Surgery

## 2013-02-28 VITALS — BP 127/90 | HR 58 | Ht 69.25 in | Wt 131.2 lb

## 2013-02-28 DIAGNOSIS — Z01810 Encounter for preprocedural cardiovascular examination: Secondary | ICD-10-CM

## 2013-02-28 DIAGNOSIS — I82609 Acute embolism and thrombosis of unspecified veins of unspecified upper extremity: Secondary | ICD-10-CM | POA: Insufficient documentation

## 2013-02-28 DIAGNOSIS — N186 End stage renal disease: Secondary | ICD-10-CM

## 2013-02-28 NOTE — Progress Notes (Signed)
Patient name: Joe Vincent MRN: [id number] DOB: 03/26/1960 Sex: male   Referred by: Dr. Hinda Lenis  Reason for referral:  Chief Complaint  Patient presents with  . Re-evaluation    eval for new access - R AVF clotted    HISTORY OF PRESENT ILLNESS: This is a 53 year old gentleman referred for dialysis access.  He is left-handed.  His renal failure secondary to diabetes and hypertension.  He states that his blood sugars run in the 130 to 200 range.  he is on dialysis Tuesday Thursday Saturday.  He is a current smoker. Past Medical History  Diagnosis Date  . Diabetes mellitus   . Hypertension   . ESRD on hemodialysis     On HD in [place] at [facility] unit.  Nephrologist is Dr. Hinda Lenis. Gets HD on TTS schedule.   [name] Retinopathy   . Peripheral neuropathy   . Chest pain     a. 05/2011 Cath: nonobs dzs.  . Myocardial infarction 2000  . Depression   . Liver disease   . Cancer     Colon  . GERD (gastroesophageal reflux disease)   . CAD (coronary artery disease)     Past Surgical History  Procedure Laterality Date  . Colostomy  Ileostomy    colon cancer  . Amputation      left pinky toe  . Esophagogastroduodenoscopy  05/23/2011    Procedure: ESOPHAGOGASTRODUODENOSCOPY (EGD);  Surgeon: Beryle Beams, MD;  Location: [facility] ENDOSCOPY;  Service: Endoscopy;  Laterality: N/A;  . Colon surgery  2009  . Cardiac catheterization  2012  . Eye surgery      History   Social History  . Marital Status: Divorced    Spouse Name: N/A    Number of Children: N/A  . Years of Education: N/A   Occupational History  . Not on file.   Social History Main Topics  . Smoking status: Current Every Day Smoker -- 0.25 packs/day for 30 years    Types: Cigarettes, Cigars  . Smokeless tobacco: Never Used  . Alcohol Use: No  . Drug Use: No  . Sexual Activity: No   Other Topics Concern  . Not on file   Social History Narrative   All relatives are deceased except a brother-in-law. Mostly cancer  but his father had CAD also.     Family History  Problem Relation Age of Onset  . Cancer Mother   . Heart disease Father   . Cancer Father     Allergies as of 02/28/2013 - Review Complete 02/28/2013  Allergen Reaction Noted  . Codeine Swelling and Rash 04/09/2011    Current Outpatient Prescriptions on File Prior to Visit  Medication Sig Dispense Refill  . ARIPiprazole (ABILIFY) 5 MG tablet Take 1 tablet (5 mg total) by mouth daily.  30 tablet  0  . diazepam (VALIUM) 10 MG tablet Take 1 tablet (10 mg total) by mouth 2 (two) times daily.  60 tablet  0  . FLUoxetine (PROZAC) 40 MG capsule Take 1 capsule (40 mg total) by mouth daily.  30 capsule  0  . HYDROmorphone (DILAUDID) 4 MG tablet Take 1 tablet (4 mg total) by mouth 3 (three) times daily as needed for pain.  30 tablet  0  . insulin aspart (NOVOLOG) 100 UNIT/ML injection Inject 4-12 Units into the skin 3 (three) times daily as needed for high blood sugar (Uses a sliding scale at home).      . insulin glargine (LANTUS) 100 UNIT/ML injection  Inject 15 Units into the skin at bedtime.       . multivitamin (RENA-VIT) TABS tablet Take 1 tablet by mouth daily.      . pantoprazole (PROTONIX) 40 MG tablet Take 40 mg by mouth daily.      . sucralfate (CARAFATE) 1 G tablet Take 1 g by mouth 4 (four) times daily -  with meals and at bedtime.      . traZODone (DESYREL) 100 MG tablet Take 1 tablet (100 mg total) by mouth at bedtime.  30 tablet  0   No current facility-administered medications on file prior to visit.     REVIEW OF SYSTEMS: Cardiovascular: positive for shortness of breath with exertion, pain in legs with walking and when lying flat Pulmonary: No productive cough, asthma or wheezing. Neurologic: positive for weakness in his arms and legs as well as numbness.   Positive fordizziness. Hematologic: No bleeding problems or clotting disorders. Musculoskeletal: No joint pain or joint swelling. Gastrointestinal: No blood in stool or  hematemesis Genitourinary: No dysuria or hematuria. Psychiatric:: No history of major depression. Integumentary: No rashes or ulcers. Constitutional: No fever or chills.  PHYSICAL EXAMINATION: General: The patient appears their stated age.  Vital signs are BP 127/90  Pulse 58  Ht 5' 9.25" (1.759 m)  Wt 131 lb 3.2 oz (59.512 kg)  BMI 19.23 kg/m2  SpO2 100% HEENT:  No gross abnormalities Pulmonary: Respirations are non-labored Musculoskeletal: There are no major deformities.   Neurologic: No focal weakness or paresthesias are detected, Skin: There are no ulcer or rashes noted. Psychiatric: The patient has normal affect. Cardiovascular: There is a regular rate and rhythm without significant murmur appreciated. palpable right brachial pulse.  Thrombosed right brachiocephalic fistula  Diagnostic Studies:  Vein mapping of the left arm was reviewed.  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  He also has very calcified radial arteries bilaterally.   Assessment:  End-stage renal disease Plan:  I discussed with the patient that I feel is best option is a right upper arm vs. a forearm dialysis graft.  He would like to get this done as soon as possible.  I will check the schedule of my partners to see if he can be available first.  I discussed the risk of steal, the risk of infection.     V. [place] Masashi Snowdon IV, M.D. [facility] [place] Office: [phone] Pager:  [phone]    

## 2013-03-01 ENCOUNTER — Encounter (HOSPITAL_COMMUNITY): Payer: Self-pay | Admitting: *Deleted

## 2013-03-01 MED ORDER — DEXTROSE 5 % IV SOLN
1.5000 g | INTRAVENOUS | Status: AC
  Administered 2013-03-02: 1.5 g via INTRAVENOUS
  Filled 2013-03-01: qty 1.5

## 2013-03-02 ENCOUNTER — Ambulatory Visit (HOSPITAL_COMMUNITY): Payer: Medicare Other | Admitting: Anesthesiology

## 2013-03-02 ENCOUNTER — Encounter (HOSPITAL_COMMUNITY)
Admission: RE | Disposition: A | Discharge status: Home / Self Care | Payer: Self-pay | Source: Ambulatory Visit | Attending: Vascular Surgery

## 2013-03-02 ENCOUNTER — Encounter (HOSPITAL_COMMUNITY): Payer: Medicare Other | Admitting: Anesthesiology

## 2013-03-02 ENCOUNTER — Ambulatory Visit (HOSPITAL_COMMUNITY)
Admission: RE | Admit: 2013-03-02 | Discharge: 2013-03-02 | Disposition: A | Discharge status: Home / Self Care | Payer: Medicare Other | Source: Ambulatory Visit | Attending: Vascular Surgery | Admitting: Vascular Surgery

## 2013-03-02 ENCOUNTER — Other Ambulatory Visit: Payer: Self-pay | Admitting: *Deleted

## 2013-03-02 ENCOUNTER — Encounter (HOSPITAL_COMMUNITY): Payer: Self-pay | Admitting: Surgery

## 2013-03-02 DIAGNOSIS — K219 Gastro-esophageal reflux disease without esophagitis: Secondary | ICD-10-CM | POA: Insufficient documentation

## 2013-03-02 DIAGNOSIS — H3500 Unspecified background retinopathy: Secondary | ICD-10-CM | POA: Insufficient documentation

## 2013-03-02 DIAGNOSIS — N186 End stage renal disease: Secondary | ICD-10-CM

## 2013-03-02 DIAGNOSIS — I120 Hypertensive chronic kidney disease with stage 5 chronic kidney disease or end stage renal disease: Secondary | ICD-10-CM | POA: Insufficient documentation

## 2013-03-02 DIAGNOSIS — G609 Hereditary and idiopathic neuropathy, unspecified: Secondary | ICD-10-CM | POA: Insufficient documentation

## 2013-03-02 DIAGNOSIS — I252 Old myocardial infarction: Secondary | ICD-10-CM | POA: Insufficient documentation

## 2013-03-02 DIAGNOSIS — I2510 Atherosclerotic heart disease of native coronary artery without angina pectoris: Secondary | ICD-10-CM | POA: Insufficient documentation

## 2013-03-02 DIAGNOSIS — Z992 Dependence on renal dialysis: Secondary | ICD-10-CM | POA: Insufficient documentation

## 2013-03-02 DIAGNOSIS — N185 Chronic kidney disease, stage 5: Secondary | ICD-10-CM

## 2013-03-02 DIAGNOSIS — Z794 Long term (current) use of insulin: Secondary | ICD-10-CM | POA: Insufficient documentation

## 2013-03-02 DIAGNOSIS — F17200 Nicotine dependence, unspecified, uncomplicated: Secondary | ICD-10-CM | POA: Insufficient documentation

## 2013-03-02 DIAGNOSIS — Z4931 Encounter for adequacy testing for hemodialysis: Secondary | ICD-10-CM

## 2013-03-02 DIAGNOSIS — E1129 Type 2 diabetes mellitus with other diabetic kidney complication: Secondary | ICD-10-CM | POA: Insufficient documentation

## 2013-03-02 HISTORY — PX: AV FISTULA PLACEMENT: SHX1204

## 2013-03-02 HISTORY — DX: Unspecified osteoarthritis, unspecified site: M19.90

## 2013-03-02 HISTORY — DX: Anxiety disorder, unspecified: F41.9

## 2013-03-02 LAB — GLUCOSE, CAPILLARY
GLUCOSE-CAPILLARY: 124 mg/dL — AB (ref 70–99)
GLUCOSE-CAPILLARY: 173 mg/dL — AB (ref 70–99)
Glucose-Capillary: 185 mg/dL — ABNORMAL HIGH (ref 70–99)
Glucose-Capillary: 141 mg/dL — ABNORMAL HIGH (ref 70–99)

## 2013-03-02 LAB — POCT I-STAT 4, (NA,K, GLUC, HGB,HCT)
GLUCOSE: 116 mg/dL — AB (ref 70–99)
HCT: 46 % (ref 39.0–52.0)
Hemoglobin: 15.6 g/dL (ref 13.0–17.0)
Potassium: 5.1 mEq/L (ref 3.7–5.3)
Sodium: 133 mEq/L — ABNORMAL LOW (ref 137–147)

## 2013-03-02 SURGERY — INSERTION OF ARTERIOVENOUS (AV) GORE-TEX GRAFT ARM
Anesthesia: General | Site: Arm Upper | Laterality: Right

## 2013-03-02 MED ORDER — MIDAZOLAM HCL 2 MG/2ML IJ SOLN
INTRAMUSCULAR | Status: AC
  Filled 2013-03-02: qty 2

## 2013-03-02 MED ORDER — OXYCODONE HCL 5 MG PO TABS
ORAL_TABLET | ORAL | Status: AC
  Filled 2013-03-02: qty 6

## 2013-03-02 MED ORDER — PHENYLEPHRINE HCL 10 MG/ML IJ SOLN
INTRAMUSCULAR | Status: DC | PRN
  Administered 2013-03-02: 80 ug via INTRAVENOUS
  Administered 2013-03-02: 120 ug via INTRAVENOUS
  Administered 2013-03-02: 80 ug via INTRAVENOUS

## 2013-03-02 MED ORDER — OXYCODONE HCL 5 MG PO TABS
30.0000 mg | ORAL_TABLET | Freq: Once | ORAL | Status: AC
  Administered 2013-03-02: 30 mg via ORAL

## 2013-03-02 MED ORDER — OXYCODONE HCL 5 MG/5ML PO SOLN: 5.0000 mg | Freq: Once | ORAL | Status: DC | PRN

## 2013-03-02 MED ORDER — PROPOFOL 10 MG/ML IV BOLUS
INTRAVENOUS | Status: AC
  Filled 2013-03-02: qty 20

## 2013-03-02 MED ORDER — EPHEDRINE SULFATE 50 MG/ML IJ SOLN
INTRAMUSCULAR | Status: DC | PRN
  Administered 2013-03-02 (×2): 10 mg via INTRAVENOUS
  Administered 2013-03-02: 5 mg via INTRAVENOUS

## 2013-03-02 MED ORDER — ONDANSETRON HCL 4 MG/2ML IJ SOLN: 4.0000 mg | Freq: Once | INTRAMUSCULAR | Status: DC | PRN

## 2013-03-02 MED ORDER — LIDOCAINE HCL (CARDIAC) 20 MG/ML IV SOLN
INTRAVENOUS | Status: AC
  Filled 2013-03-02: qty 5

## 2013-03-02 MED ORDER — HYDROMORPHONE HCL PF 1 MG/ML IJ SOLN
INTRAMUSCULAR | Status: AC
  Administered 2013-03-02: 0.5 mg via INTRAVENOUS
  Filled 2013-03-02: qty 2

## 2013-03-02 MED ORDER — THROMBIN 20000 UNITS EX SOLR
CUTANEOUS | Status: AC
  Filled 2013-03-02: qty 20000

## 2013-03-02 MED ORDER — SODIUM CHLORIDE 0.9 % IV SOLN
INTRAVENOUS | Status: DC
  Administered 2013-03-02 (×2): via INTRAVENOUS

## 2013-03-02 MED ORDER — FENTANYL CITRATE 0.05 MG/ML IJ SOLN
INTRAMUSCULAR | Status: AC
  Filled 2013-03-02: qty 5

## 2013-03-02 MED ORDER — OXYCODONE HCL 5 MG PO TABS: 5.0000 mg | ORAL_TABLET | Freq: Once | ORAL | Status: DC | PRN

## 2013-03-02 MED ORDER — MIDAZOLAM HCL 5 MG/5ML IJ SOLN
INTRAMUSCULAR | Status: DC | PRN
  Administered 2013-03-02: 2 mg via INTRAVENOUS

## 2013-03-02 MED ORDER — FENTANYL CITRATE 0.05 MG/ML IJ SOLN
INTRAMUSCULAR | Status: DC | PRN
  Administered 2013-03-02: 100 ug via INTRAVENOUS
  Administered 2013-03-02 (×2): 25 ug via INTRAVENOUS

## 2013-03-02 MED ORDER — OXYCODONE HCL 30 MG PO TABS: 30.0000 mg | ORAL_TABLET | Freq: Three times a day (TID) | ORAL | Status: DC

## 2013-03-02 MED ORDER — LIDOCAINE HCL (CARDIAC) 20 MG/ML IV SOLN
INTRAVENOUS | Status: DC | PRN
  Administered 2013-03-02: 80 mg via INTRAVENOUS

## 2013-03-02 MED ORDER — ROCURONIUM BROMIDE 50 MG/5ML IV SOLN
INTRAVENOUS | Status: AC
  Filled 2013-03-02: qty 1

## 2013-03-02 MED ORDER — SODIUM CHLORIDE 0.9 % IR SOLN
Status: DC | PRN
  Administered 2013-03-02: 12:00:00

## 2013-03-02 MED ORDER — PROPOFOL 10 MG/ML IV BOLUS
INTRAVENOUS | Status: AC
Start: 2013-03-02 — End: 2013-03-02
  Filled 2013-03-02: qty 20

## 2013-03-02 MED ORDER — HYDROMORPHONE HCL PF 1 MG/ML IJ SOLN
0.2500 mg | INTRAMUSCULAR | Status: DC | PRN
  Administered 2013-03-02 (×4): 0.5 mg via INTRAVENOUS

## 2013-03-02 MED ORDER — 0.9 % SODIUM CHLORIDE (POUR BTL) OPTIME
TOPICAL | Status: DC | PRN
  Administered 2013-03-02: 1000 mL

## 2013-03-02 MED ORDER — PROPOFOL 10 MG/ML IV BOLUS
INTRAVENOUS | Status: DC | PRN
  Administered 2013-03-02: 110 mg via INTRAVENOUS

## 2013-03-02 SURGICAL SUPPLY — 36 items
ARMBAND PINK RESTRICT EXTREMIT (MISCELLANEOUS) ×3 IMPLANT
BLADE SURG ROTATE 9660 (MISCELLANEOUS) ×3 IMPLANT
CANISTER SUCTION 2500CC (MISCELLANEOUS) ×3 IMPLANT
CLIP TI MEDIUM 6 (CLIP) ×3 IMPLANT
CLIP TI WIDE RED SMALL 6 (CLIP) ×9 IMPLANT
COVER PROBE W GEL 5X96 (DRAPES) ×3 IMPLANT
COVER SURGICAL LIGHT HANDLE (MISCELLANEOUS) ×3 IMPLANT
DECANTER SPIKE VIAL GLASS SM (MISCELLANEOUS) ×3 IMPLANT
DERMABOND ADVANCED (GAUZE/BANDAGES/DRESSINGS) ×4
DERMABOND ADVANCED .7 DNX12 (GAUZE/BANDAGES/DRESSINGS) ×2 IMPLANT
DRAIN PENROSE 1/2X12 LTX STRL (WOUND CARE) ×3 IMPLANT
ELECT REM PT RETURN 9FT ADLT (ELECTROSURGICAL) ×3
ELECTRODE REM PT RTRN 9FT ADLT (ELECTROSURGICAL) ×1 IMPLANT
GEL ULTRASOUND 20GR AQUASONIC (MISCELLANEOUS) IMPLANT
GLOVE ECLIPSE 7.5 STRL STRAW (GLOVE) ×3 IMPLANT
GLOVE SS BIOGEL STRL SZ 7 (GLOVE) ×1 IMPLANT
GLOVE SUPERSENSE BIOGEL SZ 7 (GLOVE) ×2
GOWN STRL REUS W/ TWL LRG LVL3 (GOWN DISPOSABLE) ×3 IMPLANT
GOWN STRL REUS W/TWL LRG LVL3 (GOWN DISPOSABLE) ×6
KIT BASIN OR (CUSTOM PROCEDURE TRAY) ×3 IMPLANT
KIT ROOM TURNOVER OR (KITS) ×3 IMPLANT
NEEDLE HYPO 25GX1X1/2 BEV (NEEDLE) ×3 IMPLANT
NS IRRIG 1000ML POUR BTL (IV SOLUTION) ×3 IMPLANT
PACK CV ACCESS (CUSTOM PROCEDURE TRAY) ×3 IMPLANT
PAD ARMBOARD 7.5X6 YLW CONV (MISCELLANEOUS) ×6 IMPLANT
SUT PROLENE 6 0 BV (SUTURE) ×6 IMPLANT
SUT PROLENE 6 0 CC (SUTURE) ×3 IMPLANT
SUT SILK 2 0 SH (SUTURE) ×3 IMPLANT
SUT SILK 3 0 (SUTURE) ×2
SUT SILK 3-0 18XBRD TIE 12 (SUTURE) ×1 IMPLANT
SUT VIC AB 3-0 SH 27 (SUTURE) ×8
SUT VIC AB 3-0 SH 27X BRD (SUTURE) ×4 IMPLANT
TOWEL OR 17X24 6PK STRL BLUE (TOWEL DISPOSABLE) ×3 IMPLANT
TOWEL OR 17X26 10 PK STRL BLUE (TOWEL DISPOSABLE) ×3 IMPLANT
UNDERPAD 30X30 INCONTINENT (UNDERPADS AND DIAPERS) ×3 IMPLANT
WATER STERILE IRR 1000ML POUR (IV SOLUTION) ×3 IMPLANT

## 2013-03-02 NOTE — Progress Notes (Signed)
Nurse asked patient about planned procedure today and patient informed Nurse that he visited Dr. Trula Slade in his office on 01-28-14 and Dr. Kevin Fenton stated that they could no longer use his right arm (which currently has a non working fistula) and that a arteriovenous graft would be placed in his left arm. Patient consent form stated arteriovenous graft would be placed in the right arm. Nurse looked up office note from Dr. Trula Slade and his note stated the arteriovenous graft would be placed in the right arm. Nurse then called Dr. Kellie Simmering for clarification and he stated he would talk with Dr. Trula Slade and let me know when upon arrival to Short Stay.

## 2013-03-02 NOTE — Interval H&P Note (Signed)
History and Physical Interval Note:  03/02/2013 8:20 AM  Joe Vincent  has presented today for surgery, with the diagnosis of End Stage Renal Disease  The various methods of treatment have been discussed with the patient and family. After consideration of risks, benefits and other options for treatment, the patient has consented to  Procedure(s): INSERTION OF ARTERIOVENOUS (AV) GORE-TEX GRAFT ARM - RIGHT FOREARM VERSUS UPPER ARM (Right) as a surgical intervention .  The patient's history has been reviewed, patient examined, no change in status, stable for surgery.  I have reviewed the patient's chart and labs.  Questions were answered to the patient's satisfaction.     [name]

## 2013-03-02 NOTE — Discharge Instructions (Signed)
° ° °  03/02/2013 Joe Vincent [id number] [birth year]  Surgeon(s): Mal Misty, MD  Procedure(s): INSERTION OF ARTERIOVENOUS (AV) GORE-TEX GRAFT ARM - RIGHT FOREARM VERSUS UPPER ARM  x Do not stick graft for 12 weeks

## 2013-03-02 NOTE — Preoperative (Signed)
Beta Blockers   Reason not to administer Beta Blockers:Not Applicable 

## 2013-03-02 NOTE — Progress Notes (Signed)
Dr. Kellie Simmering is at stretcher side and he stated that he read Dr. Stephens Shire note and patient is having surgery on the right arm and that the IV can be placed in the left arm.

## 2013-03-02 NOTE — Transfer of Care (Signed)
Immediate Anesthesia Transfer of Care Note  Patient: Joe Vincent  Procedure(s) Performed: Procedure(s): INSERTION OF ARTERIOVENOUS (AV) GORE-TEX GRAFT ARM - RIGHT FOREARM VERSUS UPPER ARM (Right)  Patient Location: PACU  Anesthesia Type:General  Level of Consciousness: awake, alert  and oriented  Airway & Oxygen Therapy: Patient Spontanous Breathing and Patient connected to nasal cannula oxygen  Post-op Assessment: Report given to PACU RN and Post -op Vital signs reviewed and stable  Post vital signs: Reviewed and stable  Complications: No apparent anesthesia complications

## 2013-03-02 NOTE — H&P (View-Only) (Signed)
Patient name: Joe Vincent MRN: [id number] DOB: 03/26/1960 Sex: male   Referred by: Dr. Hinda Lenis  Reason for referral:  Chief Complaint  Patient presents with  . Re-evaluation    eval for new access - R AVF clotted    HISTORY OF PRESENT ILLNESS: This is a 53 year old gentleman referred for dialysis access.  He is left-handed.  His renal failure secondary to diabetes and hypertension.  He states that his blood sugars run in the 130 to 200 range.  he is on dialysis Tuesday Thursday Saturday.  He is a current smoker. Past Medical History  Diagnosis Date  . Diabetes mellitus   . Hypertension   . ESRD on hemodialysis     On HD in [place] at [facility].  Nephrologist is Dr. Hinda Lenis. Gets HD on TTS schedule.   [name] Retinopathy   . Peripheral neuropathy   . Chest pain     a. 05/2011 Cath: nonobs dzs.  . Myocardial infarction 2000  . Depression   . Liver disease   . Cancer     Colon  . GERD (gastroesophageal reflux disease)   . CAD (coronary artery disease)     Past Surgical History  Procedure Laterality Date  . Colostomy  Ileostomy    colon cancer  . Amputation      left pinky toe  . Esophagogastroduodenoscopy  05/23/2011    Procedure: ESOPHAGOGASTRODUODENOSCOPY (EGD);  Surgeon: Beryle Beams, MD;  Location: [facility] ENDOSCOPY;  Service: Endoscopy;  Laterality: N/A;  . Colon surgery  2009  . Cardiac catheterization  2012  . Eye surgery      History   Social History  . Marital Status: Divorced    Spouse Name: N/A    Number of Children: N/A  . Years of Education: N/A   Occupational History  . Not on file.   Social History Main Topics  . Smoking status: Current Every Day Smoker -- 0.25 packs/day for 30 years    Types: Cigarettes, Cigars  . Smokeless tobacco: Never Used  . Alcohol Use: No  . Drug Use: No  . Sexual Activity: No   Other Topics Concern  . Not on file   Social History Narrative   All relatives are deceased except a brother-in-law. Mostly cancer  but his father had CAD also.     Family History  Problem Relation Age of Onset  . Cancer Mother   . Heart disease Father   . Cancer Father     Allergies as of 02/28/2013 - Review Complete 02/28/2013  Allergen Reaction Noted  . Codeine Swelling and Rash 04/09/2011    Current Outpatient Prescriptions on File Prior to Visit  Medication Sig Dispense Refill  . ARIPiprazole (ABILIFY) 5 MG tablet Take 1 tablet (5 mg total) by mouth daily.  30 tablet  0  . diazepam (VALIUM) 10 MG tablet Take 1 tablet (10 mg total) by mouth 2 (two) times daily.  60 tablet  0  . FLUoxetine (PROZAC) 40 MG capsule Take 1 capsule (40 mg total) by mouth daily.  30 capsule  0  . HYDROmorphone (DILAUDID) 4 MG tablet Take 1 tablet (4 mg total) by mouth 3 (three) times daily as needed for pain.  30 tablet  0  . insulin aspart (NOVOLOG) 100 UNIT/ML injection Inject 4-12 Units into the skin 3 (three) times daily as needed for high blood sugar (Uses a sliding scale at home).      . insulin glargine (LANTUS) 100 UNIT/ML injection  Inject 15 Units into the skin at bedtime.       . multivitamin (RENA-VIT) TABS tablet Take 1 tablet by mouth daily.      . pantoprazole (PROTONIX) 40 MG tablet Take 40 mg by mouth daily.      . sucralfate (CARAFATE) 1 G tablet Take 1 g by mouth 4 (four) times daily -  with meals and at bedtime.      . traZODone (DESYREL) 100 MG tablet Take 1 tablet (100 mg total) by mouth at bedtime.  30 tablet  0   No current facility-administered medications on file prior to visit.     REVIEW OF SYSTEMS: Cardiovascular: positive for shortness of breath with exertion, pain in legs with walking and when lying flat Pulmonary: No productive cough, asthma or wheezing. Neurologic: positive for weakness in his arms and legs as well as numbness.   Positive fordizziness. Hematologic: No bleeding problems or clotting disorders. Musculoskeletal: No joint pain or joint swelling. Gastrointestinal: No blood in stool or  hematemesis Genitourinary: No dysuria or hematuria. Psychiatric:: No history of major depression. Integumentary: No rashes or ulcers. Constitutional: No fever or chills.  PHYSICAL EXAMINATION: General: The patient appears their stated age.  Vital signs are BP 127/90  Pulse 58  Ht 5' 9.25" (1.759 m)  Wt 131 lb 3.2 oz (59.512 kg)  BMI 19.23 kg/m2  SpO2 100% HEENT:  No gross abnormalities Pulmonary: Respirations are non-labored Musculoskeletal: There are no major deformities.   Neurologic: No focal weakness or paresthesias are detected, Skin: There are no ulcer or rashes noted. Psychiatric: The patient has normal affect. Cardiovascular: There is a regular rate and rhythm without significant murmur appreciated. palpable right brachial pulse.  Thrombosed right brachiocephalic fistula  Diagnostic Studies:  Vein mapping of the left arm was reviewed.  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  He also has very calcified radial arteries bilaterally.   Assessment:  End-stage renal disease Plan:  I discussed with the patient that I feel is best option is a right upper arm vs. a forearm dialysis graft.  He would like to get this done as soon as possible.  I will check the schedule of my partners to see if he can be available first.  I discussed the risk of steal, the risk of infection.     Eldridge Abrahams, M.D. [facility] Office: [phone] Pager:  [phone]

## 2013-03-02 NOTE — Op Note (Signed)
OPERATIVE REPORT  Date of Surgery: 03/02/2013  Surgeon: [name], MD  Assistant: Dionicio Stall  Pre-op Diagnosis: End Stage Renal Disease  Post-op Diagnosis: End Stage Renal Disease  Procedure: Procedure(s): Right basilic vein transposition  Anesthesia: LMA  EBL: Minimal  Complications: None  Procedure Details: Patient taken to the operating room placed in supine position at which time satisfactory Gen.-LMA anesthesia was administered. The veins in the right upper extremity were then imaged using the SonoSite ultrasound. The patient had a chronically occluded brachial-cephalic AV fistula. Basilic vein was of good caliber down into the proximal forearm extending up to the axilla plan was then made to create a basilic vein transposition. Right upper extremity was prepped Betadine scrub and solution draped in routine sterile manner. Basilic vein was exposed in the proximal forearm and through 4 separate incisions on the medial aspect of the right arm basilic vein was dissected free its branches ligated with 3 and 4-0 silk ties and divided it was ligated distally and transected gently dilated with heparinized saline was an excellent vein being at least 3 mm in size in its smallest area and dilated up to 4-1/2 mm near the axilla. Brachial artery was exposed in the distal upper arm and one of the vein harvesting incisions. Rectal artery was an excellent vessel about 3 mm in size with a good pulse. The vein was then carefully delivered through a tunnel which was created on the anterior aspect of    Jame being careful to preserve its orientation. Brachial artery was occluded proximally and distally with Vesseloops a 15 blade extended with Potts scissors. Silicon was slightly spatulated and anastomosed end to side brachial artery with 6-0 Prolene. Clamps were released there was excellent pulse and thrill in the fistula. There was a palpable radial pulse with fistula opening was slightly  improved with compression of the fistula. Heparin and protamine were not given. Adequate hemostasis was achieved the wounds were closed in layers with Vicryl in subcuticular fashion Dermabond patient to recovery in stable condition  Kellie Simmering, MD 03/02/2013 2:51 PM

## 2013-03-02 NOTE — Anesthesia Preprocedure Evaluation (Signed)
Anesthesia Evaluation  Patient identified by MRN, date of birth, ID band Patient awake    Reviewed: Allergy & Precautions, H&P , NPO status , Patient's Chart, lab work & pertinent test results  Airway Mallampati: I TM Distance: >3 FB Neck ROM: Full    Dental  (+) Missing, Poor Dentition and Dental Advisory Given   Pulmonary Current Smoker,  breath sounds clear to auscultation        Cardiovascular hypertension, + CAD and + Past MI Rhythm:Regular Rate:Normal     Neuro/Psych    GI/Hepatic   Endo/Other  diabetes, Well Controlled, Type 2, Insulin Dependent  Renal/GU CRF and DialysisRenal disease     Musculoskeletal   Abdominal   Peds  Hematology   Anesthesia Other Findings   Reproductive/Obstetrics                           Anesthesia Physical Anesthesia Plan  ASA: III  Anesthesia Plan: General   Post-op Pain Management:    Induction: Intravenous  Airway Management Planned: LMA  Additional Equipment:   Intra-op Plan:   Post-operative Plan: Extubation in OR  Informed Consent: I have reviewed the patients History and Physical, chart, labs and discussed the procedure including the risks, benefits and alternatives for the proposed anesthesia with the patient or authorized representative who has indicated his/her understanding and acceptance.   Dental advisory given  Plan Discussed with: CRNA, Anesthesiologist and Surgeon  Anesthesia Plan Comments:         Anesthesia Quick Evaluation

## 2013-03-03 ENCOUNTER — Encounter (HOSPITAL_COMMUNITY): Payer: Self-pay | Admitting: Vascular Surgery

## 2013-03-03 ENCOUNTER — Telehealth: Payer: Self-pay | Admitting: Vascular Surgery

## 2013-03-03 NOTE — Telephone Encounter (Addendum)
Message copied by Doristine Section on Thu Mar 03, 2013 10:30 AM ------      Message from: [place], [place] K      Created: Wed Mar 02, 2013  2:52 PM      Regarding: Schedule                   ----- Message -----         From: Gabriel Earing, PA-C         Sent: 03/02/2013   2:44 PM           To: [facility] Charge Pool            S/p right BVT 03/02/13.  F/u with Dr. Kellie Simmering in 6 weeks.            Thanks,      Aldona Bar ------  notified patient of post op appt. with dr. Kellie Simmering on 04-12-13 8:45

## 2013-03-03 NOTE — Anesthesia Postprocedure Evaluation (Signed)
  Anesthesia Post-op Note  Patient: Joe Vincent  Procedure(s) Performed: Procedure(s): INSERTION OF ARTERIOVENOUS (AV) GORE-TEX GRAFT ARM - RIGHT FOREARM VERSUS UPPER ARM (Right)  Patient Location: PACU  Anesthesia Type:MAC  Level of Consciousness: awake, alert  and oriented  Airway and Oxygen Therapy: Patient Spontanous Breathing and Patient connected to nasal cannula oxygen  Post-op Pain: mild  Post-op Assessment: Post-op Vital signs reviewed  Post-op Vital Signs: Reviewed  Complications: No apparent anesthesia complications

## 2013-03-07 ENCOUNTER — Telehealth: Payer: Self-pay

## 2013-03-07 NOTE — Telephone Encounter (Signed)
Rec'd call from pt. C/o swelling in right hand the size of a softball.   Reports bruising around the surgical site.  Reports the surgical site is "a little firm."   Denies any fever/chills/drainage.  Questioned if he had been elevating his right hand/ arm?  Reports he did not receive that instruction.  Advised to elevate several times/ day above level of heart for approx. 30 minute intervals, and to gently move his fingers to maintain mobility/ assist in decreasing the swelling.  Encouraged to monitor site for worsening symptoms; ie: increased inflammation/ tenderness/ firmness/ drainage at the surg. site, or fever / chills.  Verb. Understanding.

## 2013-03-15 ENCOUNTER — Telehealth: Payer: Self-pay | Admitting: Vascular Surgery

## 2013-03-15 NOTE — Telephone Encounter (Signed)
Message copied by Doristine Section on Tue Mar 15, 2013  3:21 PM ------      Message from: Denman George      Created: Tue Mar 15, 2013 11:40 AM      Regarding: needs appt 2/13       Stanton Kidney from [facility] in [place] requested appt. to check right arm swelling; reports that the right arm is 3 x size of the left arm; s/p right BVT of 1/28 per [facility].  Gave appt. for 2/13 @ 11:00 AM with [facility]; pt. Instructed to be here at 10:45 AM. ------

## 2013-03-17 ENCOUNTER — Encounter: Payer: Self-pay | Admitting: Vascular Surgery

## 2013-03-18 ENCOUNTER — Ambulatory Visit (INDEPENDENT_AMBULATORY_CARE_PROVIDER_SITE_OTHER): Payer: Medicare Other | Admitting: Vascular Surgery

## 2013-03-18 ENCOUNTER — Encounter: Payer: Self-pay | Admitting: Vascular Surgery

## 2013-03-18 VITALS — BP 158/74 | HR 78 | Temp 98.5°F | Ht 72.0 in | Wt 148.1 lb

## 2013-03-18 DIAGNOSIS — N186 End stage renal disease: Secondary | ICD-10-CM

## 2013-03-18 DIAGNOSIS — T82898A Other specified complication of vascular prosthetic devices, implants and grafts, initial encounter: Secondary | ICD-10-CM

## 2013-03-18 DIAGNOSIS — Z992 Dependence on renal dialysis: Secondary | ICD-10-CM

## 2013-03-18 NOTE — Progress Notes (Signed)
Postoperative Access Visit   History of Present Illness  Joe Vincent is a 53 y.o. year old male who presents for postoperative follow-up for: R single stage BVT by Dr. Kellie Simmering (Date: 03/02/13).  The patient's wounds are healing.  The patient notes no steal symptoms.  He has total right arm swelling which painful and limiting his use of his dominant R arm.  The patient notes he had swelling since the procedure and it has been getting worsen  Past Medical History  Diagnosis Date  . Diabetes mellitus   . Hypertension   . ESRD on hemodialysis     On HD in [place] at [facility].  Nephrologist is Dr. Hinda Lenis. Gets HD on TTS schedule.   [name] Retinopathy   . Peripheral neuropathy   . Chest pain     a. 05/2011 Cath: nonobs dzs.  . Myocardial infarction 2000  . Depression   . Liver disease   . Cancer     Colon  . GERD (gastroesophageal reflux disease)   . CAD (coronary artery disease)   . OA (osteoarthritis)     Hx: of  . Anxiety     Past Surgical History  Procedure Laterality Date  . Colostomy  Ileostomy    colon cancer  . Amputation      left pinky toe  . Esophagogastroduodenoscopy  05/23/2011    Procedure: ESOPHAGOGASTRODUODENOSCOPY (EGD);  Surgeon: Beryle Beams, MD;  Location: [facility] ENDOSCOPY;  Service: Endoscopy;  Laterality: N/A;  . Colon surgery  2009  . Cardiac catheterization  2012  . Eye surgery    . Av fistula placement Right 03/02/2013    Procedure: INSERTION OF ARTERIOVENOUS (AV) GORE-TEX GRAFT ARM - RIGHT FOREARM VERSUS UPPER ARM;  Surgeon: Mal Misty, MD;  Location: [place];  Service: Vascular;  Laterality: Right;    History   Social History  . Marital Status: Divorced    Spouse Name: N/A    Number of Children: N/A  . Years of Education: N/A   Occupational History  . Not on file.   Social History Main Topics  . Smoking status: Current Every Day Smoker -- 0.25 packs/day for 30 years    Types: Cigarettes  . Smokeless tobacco: Former Systems developer  . Alcohol  Use: No  . Drug Use: No  . Sexual Activity: No   Other Topics Concern  . Not on file   Social History Narrative   All relatives are deceased except a brother-in-law. Mostly cancer but his father had CAD also.     Family History  Problem Relation Age of Onset  . Cancer Mother   . Heart disease Father   . Cancer Father     Current Outpatient Prescriptions on File Prior to Visit  Medication Sig Dispense Refill  . insulin aspart (NOVOLOG) 100 UNIT/ML injection Inject 4-12 Units into the skin 3 (three) times daily as needed for high blood sugar (Uses a sliding scale at home).      . insulin glargine (LANTUS) 100 UNIT/ML injection Inject 15 Units into the skin at bedtime.       . multivitamin (RENA-VIT) TABS tablet Take 1 tablet by mouth daily.      [name] oxycodone (ROXICODONE) 30 MG immediate release tablet Take 1 tablet (30 mg total) by mouth 3 (three) times daily.  20 tablet  0  . pantoprazole (PROTONIX) 40 MG tablet Take 40 mg by mouth daily.      . sucralfate (CARAFATE) 1 G tablet Take  1 g by mouth 4 (four) times daily -  with meals and at bedtime.      . traZODone (DESYREL) 100 MG tablet Take 1 tablet (100 mg total) by mouth at bedtime.  30 tablet  0   No current facility-administered medications on file prior to visit.    Allergies  Allergen Reactions  . Codeine Swelling and Rash    REVIEW OF SYSTEMS:  (Positives checked otherwise negative)  CARDIOVASCULAR:  []  chest pain, []  chest pressure, []  palpitations, []  shortness of breath when laying flat, []  shortness of breath with exertion,  []  pain in feet when walking, []  pain in feet when laying flat, []  history of blood clot in veins (DVT), []  history of phlebitis, [x]  swelling in R arm, []  varicose veins  PULMONARY:  []  productive cough, []  asthma, []  wheezing  NEUROLOGIC:  []  weakness in arms or legs, []  numbness in arms or legs, []  difficulty speaking or slurred speech, []  temporary loss of vision in one eye, []   dizziness  HEMATOLOGIC:  []  bleeding problems, []  problems with blood clotting too easily  MUSCULOSKEL:  []  joint pain, []  joint swelling  GASTROINTEST:  []  vomiting blood, []  blood in stool     GENITOURINARY:  []  burning with urination, []  blood in urine, [x]  ESRD-HD: T-R-S  PSYCHIATRIC:  []  history of major depression  INTEGUMENTARY:  []  rashes, []  ulcers  For VQI Use Only  PRE-ADM LIVING: Home  AMB STATUS: Ambulatory  Physical Examination Filed Vitals:   03/18/13 1108  BP: 158/74  Pulse: 78  Temp: 98.5 F (36.9 C)   Pulmonary: Sym exp, good air movt, CTAB, no rales, rhonchi, & wheezing  Cardiac: RRR, Nl S1, S2, no Murmurs, rubs or gallops  RUE: Incisions are nearly healed, skin feels warm, hand grip is 3-4/5, sensation in digits is intact, palpable thrill, bruit can be auscultated , entire right arm is swollen  Medical Decision Making  Joe Vincent is a 53 y.o. year old male who presents s/p R single stage BVT with venous outflow complication.  I recommend R arm fistulogram, possible intervention.  I discussed with the patient the nature of angiographic procedures, especially the limited patencies of any endovascular intervention.  The patient is aware of that the risks of an angiographic procedure include but are not limited to: bleeding, infection, access site complications, renal failure, embolization, rupture of vessel, dissection, possible need for emergent surgical intervention, possible need for surgical procedures to treat the patient's pathology, anaphylactic reaction to contrast, and stroke and death.    The patient is aware of the risks and agrees to proceed.  He is scheduled with Dr. Oneida Alar on 20 FEB 15.  Thank you for allowing [place] to participate in this patient's care.  Adele Barthel, MD [facility] Office: [phone] Pager: [phone]  03/18/2013, 1:20 PM

## 2013-03-25 ENCOUNTER — Other Ambulatory Visit: Payer: Self-pay

## 2013-04-01 ENCOUNTER — Encounter (HOSPITAL_COMMUNITY): Payer: Self-pay | Admitting: Emergency Medicine

## 2013-04-01 ENCOUNTER — Emergency Department (HOSPITAL_COMMUNITY): Payer: Medicare Other

## 2013-04-01 ENCOUNTER — Inpatient Hospital Stay (HOSPITAL_COMMUNITY)
Admission: EM | Admit: 2013-04-01 | Discharge: 2013-04-07 | DRG: 628 | Disposition: A | Discharge status: Other Acute Inpatient Hospital | Payer: Medicare Other | Attending: Internal Medicine | Admitting: Internal Medicine

## 2013-04-01 DIAGNOSIS — R29898 Other symptoms and signs involving the musculoskeletal system: Secondary | ICD-10-CM | POA: Diagnosis present

## 2013-04-01 DIAGNOSIS — F3289 Other specified depressive episodes: Secondary | ICD-10-CM | POA: Diagnosis present

## 2013-04-01 DIAGNOSIS — Z602 Problems related to living alone: Secondary | ICD-10-CM

## 2013-04-01 DIAGNOSIS — I10 Essential (primary) hypertension: Secondary | ICD-10-CM

## 2013-04-01 DIAGNOSIS — F411 Generalized anxiety disorder: Secondary | ICD-10-CM | POA: Diagnosis present

## 2013-04-01 DIAGNOSIS — R918 Other nonspecific abnormal finding of lung field: Secondary | ICD-10-CM

## 2013-04-01 DIAGNOSIS — F332 Major depressive disorder, recurrent severe without psychotic features: Secondary | ICD-10-CM

## 2013-04-01 DIAGNOSIS — Z85038 Personal history of other malignant neoplasm of large intestine: Secondary | ICD-10-CM

## 2013-04-01 DIAGNOSIS — Z8249 Family history of ischemic heart disease and other diseases of the circulatory system: Secondary | ICD-10-CM

## 2013-04-01 DIAGNOSIS — I252 Old myocardial infarction: Secondary | ICD-10-CM

## 2013-04-01 DIAGNOSIS — G609 Hereditary and idiopathic neuropathy, unspecified: Secondary | ICD-10-CM | POA: Diagnosis present

## 2013-04-01 DIAGNOSIS — B1920 Unspecified viral hepatitis C without hepatic coma: Secondary | ICD-10-CM | POA: Diagnosis present

## 2013-04-01 DIAGNOSIS — I2510 Atherosclerotic heart disease of native coronary artery without angina pectoris: Secondary | ICD-10-CM

## 2013-04-01 DIAGNOSIS — J811 Chronic pulmonary edema: Secondary | ICD-10-CM | POA: Diagnosis present

## 2013-04-01 DIAGNOSIS — X838XXA Intentional self-harm by other specified means, initial encounter: Secondary | ICD-10-CM

## 2013-04-01 DIAGNOSIS — I498 Other specified cardiac arrhythmias: Secondary | ICD-10-CM | POA: Diagnosis present

## 2013-04-01 DIAGNOSIS — H3500 Unspecified background retinopathy: Secondary | ICD-10-CM | POA: Diagnosis present

## 2013-04-01 DIAGNOSIS — E1129 Type 2 diabetes mellitus with other diabetic kidney complication: Secondary | ICD-10-CM | POA: Diagnosis present

## 2013-04-01 DIAGNOSIS — Z720 Tobacco use: Secondary | ICD-10-CM

## 2013-04-01 DIAGNOSIS — Z85118 Personal history of other malignant neoplasm of bronchus and lung: Secondary | ICD-10-CM

## 2013-04-01 DIAGNOSIS — Z66 Do not resuscitate: Secondary | ICD-10-CM | POA: Diagnosis present

## 2013-04-01 DIAGNOSIS — K219 Gastro-esophageal reflux disease without esophagitis: Secondary | ICD-10-CM | POA: Diagnosis present

## 2013-04-01 DIAGNOSIS — I4891 Unspecified atrial fibrillation: Secondary | ICD-10-CM

## 2013-04-01 DIAGNOSIS — J449 Chronic obstructive pulmonary disease, unspecified: Secondary | ICD-10-CM | POA: Diagnosis present

## 2013-04-01 DIAGNOSIS — F329 Major depressive disorder, single episode, unspecified: Secondary | ICD-10-CM | POA: Diagnosis present

## 2013-04-01 DIAGNOSIS — E43 Unspecified severe protein-calorie malnutrition: Secondary | ICD-10-CM

## 2013-04-01 DIAGNOSIS — Z794 Long term (current) use of insulin: Secondary | ICD-10-CM

## 2013-04-01 DIAGNOSIS — K2970 Gastritis, unspecified, without bleeding: Secondary | ICD-10-CM

## 2013-04-01 DIAGNOSIS — S98139A Complete traumatic amputation of one unspecified lesser toe, initial encounter: Secondary | ICD-10-CM

## 2013-04-01 DIAGNOSIS — T82898A Other specified complication of vascular prosthetic devices, implants and grafts, initial encounter: Secondary | ICD-10-CM

## 2013-04-01 DIAGNOSIS — E875 Hyperkalemia: Principal | ICD-10-CM

## 2013-04-01 DIAGNOSIS — R001 Bradycardia, unspecified: Secondary | ICD-10-CM

## 2013-04-01 DIAGNOSIS — Z888 Allergy status to other drugs, medicaments and biological substances status: Secondary | ICD-10-CM

## 2013-04-01 DIAGNOSIS — M1990 Unspecified osteoarthritis, unspecified site: Secondary | ICD-10-CM | POA: Diagnosis present

## 2013-04-01 DIAGNOSIS — M6281 Muscle weakness (generalized): Secondary | ICD-10-CM

## 2013-04-01 DIAGNOSIS — I120 Hypertensive chronic kidney disease with stage 5 chronic kidney disease or end stage renal disease: Secondary | ICD-10-CM | POA: Diagnosis present

## 2013-04-01 DIAGNOSIS — Z992 Dependence on renal dialysis: Secondary | ICD-10-CM

## 2013-04-01 DIAGNOSIS — Z932 Ileostomy status: Secondary | ICD-10-CM

## 2013-04-01 DIAGNOSIS — E119 Type 2 diabetes mellitus without complications: Secondary | ICD-10-CM

## 2013-04-01 DIAGNOSIS — Z9115 Patient's noncompliance with renal dialysis: Secondary | ICD-10-CM

## 2013-04-01 DIAGNOSIS — Z79899 Other long term (current) drug therapy: Secondary | ICD-10-CM

## 2013-04-01 DIAGNOSIS — F17200 Nicotine dependence, unspecified, uncomplicated: Secondary | ICD-10-CM | POA: Diagnosis present

## 2013-04-01 DIAGNOSIS — M7989 Other specified soft tissue disorders: Secondary | ICD-10-CM | POA: Diagnosis present

## 2013-04-01 DIAGNOSIS — Z923 Personal history of irradiation: Secondary | ICD-10-CM

## 2013-04-01 DIAGNOSIS — I871 Compression of vein: Secondary | ICD-10-CM | POA: Diagnosis present

## 2013-04-01 DIAGNOSIS — N186 End stage renal disease: Secondary | ICD-10-CM

## 2013-04-01 DIAGNOSIS — D696 Thrombocytopenia, unspecified: Secondary | ICD-10-CM | POA: Diagnosis present

## 2013-04-01 DIAGNOSIS — Z9221 Personal history of antineoplastic chemotherapy: Secondary | ICD-10-CM

## 2013-04-01 DIAGNOSIS — Z91158 Patient's noncompliance with renal dialysis for other reason: Secondary | ICD-10-CM

## 2013-04-01 DIAGNOSIS — R45851 Suicidal ideations: Secondary | ICD-10-CM

## 2013-04-01 DIAGNOSIS — J189 Pneumonia, unspecified organism: Secondary | ICD-10-CM

## 2013-04-01 DIAGNOSIS — J4489 Other specified chronic obstructive pulmonary disease: Secondary | ICD-10-CM | POA: Diagnosis present

## 2013-04-01 DIAGNOSIS — D631 Anemia in chronic kidney disease: Secondary | ICD-10-CM | POA: Diagnosis present

## 2013-04-01 DIAGNOSIS — N039 Chronic nephritic syndrome with unspecified morphologic changes: Secondary | ICD-10-CM

## 2013-04-01 DIAGNOSIS — R079 Chest pain, unspecified: Secondary | ICD-10-CM

## 2013-04-01 HISTORY — DX: Esophagitis, unspecified: K20.9

## 2013-04-01 LAB — CBC
HCT: 36.8 % — ABNORMAL LOW (ref 39.0–52.0)
Hemoglobin: 12.5 g/dL — ABNORMAL LOW (ref 13.0–17.0)
MCH: 31.4 pg (ref 26.0–34.0)
MCHC: 34 g/dL (ref 30.0–36.0)
MCV: 92.5 fL (ref 78.0–100.0)
Platelets: 138 10*3/uL — ABNORMAL LOW (ref 150–400)
RBC: 3.98 MIL/uL — ABNORMAL LOW (ref 4.22–5.81)
RDW: 15.3 % (ref 11.5–15.5)
WBC: 9.4 10*3/uL (ref 4.0–10.5)

## 2013-04-01 LAB — TROPONIN I
Troponin I: <0.3 ng/mL (ref <0.30)
Troponin I: <0.3 ng/mL (ref <0.30)

## 2013-04-01 LAB — COMPREHENSIVE METABOLIC PANEL
ALK PHOS: 201 U/L — AB (ref 39–117)
ALT: 34 U/L (ref 0–53)
AST: 28 U/L (ref 0–37)
Albumin: 3.3 g/dL — ABNORMAL LOW (ref 3.5–5.2)
BUN: 87 mg/dL — AB (ref 6–23)
CHLORIDE: 97 meq/L (ref 96–112)
CO2: 13 mEq/L — ABNORMAL LOW (ref 19–32)
Calcium: 8.6 mg/dL (ref 8.4–10.5)
Creatinine, Ser: 9.16 mg/dL — ABNORMAL HIGH (ref 0.50–1.35)
GFR calc non Af Amer: 6 mL/min — ABNORMAL LOW (ref >90)
GFR, EST AFRICAN AMERICAN: 7 mL/min — AB (ref >90)
GLUCOSE: 225 mg/dL — AB (ref 70–99)
SODIUM: 132 meq/L — AB (ref 137–147)
Total Protein: 7.8 g/dL (ref 6.0–8.3)

## 2013-04-01 LAB — CBC WITH DIFFERENTIAL/PLATELET
Basophils Absolute: 0 10*3/uL (ref 0.0–0.1)
Basophils Relative: 0 % (ref 0–1)
EOS ABS: 0 10*3/uL (ref 0.0–0.7)
Eosinophils Relative: 0 % (ref 0–5)
HCT: 40.7 % (ref 39.0–52.0)
HEMOGLOBIN: 13.5 g/dL (ref 13.0–17.0)
LYMPHS ABS: 1.3 10*3/uL (ref 0.7–4.0)
LYMPHS PCT: 12 % (ref 12–46)
MCH: 31.3 pg (ref 26.0–34.0)
MCHC: 33.2 g/dL (ref 30.0–36.0)
MCV: 94.2 fL (ref 78.0–100.0)
MONOS PCT: 12 % (ref 3–12)
Monocytes Absolute: 1.2 10*3/uL — ABNORMAL HIGH (ref 0.1–1.0)
NEUTROS ABS: 7.6 10*3/uL (ref 1.7–7.7)
Neutrophils Relative %: 75 % (ref 43–77)
Platelets: 157 10*3/uL (ref 150–400)
RBC: 4.32 MIL/uL (ref 4.22–5.81)
RDW: 15.5 % (ref 11.5–15.5)
WBC: 10.1 10*3/uL (ref 4.0–10.5)

## 2013-04-01 LAB — I-STAT TROPONIN, ED: Troponin i, poc: 0.01 ng/mL (ref 0.00–0.08)

## 2013-04-01 LAB — POTASSIUM
Potassium: >7.7 mEq/L (ref 3.7–5.3)
Potassium: 4.2 mEq/L (ref 3.7–5.3)

## 2013-04-01 LAB — CREATININE, SERUM
CREATININE: 4.09 mg/dL — AB (ref 0.50–1.35)
GFR calc Af Amer: 18 mL/min — ABNORMAL LOW (ref >90)
GFR calc non Af Amer: 15 mL/min — ABNORMAL LOW (ref >90)

## 2013-04-01 LAB — CBG MONITORING, ED: Glucose-Capillary: 166 mg/dL — ABNORMAL HIGH (ref 70–99)

## 2013-04-01 LAB — MAGNESIUM: MAGNESIUM: 1.4 mg/dL — AB (ref 1.5–2.5)

## 2013-04-01 LAB — HEPATITIS B SURFACE ANTIGEN: Hepatitis B Surface Ag: NEGATIVE

## 2013-04-01 LAB — GLUCOSE, CAPILLARY: GLUCOSE-CAPILLARY: 231 mg/dL — AB (ref 70–99)

## 2013-04-01 MED ORDER — HYDROCODONE-ACETAMINOPHEN 5-325 MG PO TABS
1.0000 | ORAL_TABLET | Freq: Four times a day (QID) | ORAL | Status: DC | PRN
  Administered 2013-04-02: 1 via ORAL
  Filled 2013-04-01: qty 1

## 2013-04-01 MED ORDER — ACETAMINOPHEN 325 MG PO TABS
650.0000 mg | ORAL_TABLET | Freq: Four times a day (QID) | ORAL | Status: DC | PRN
  Filled 2013-04-01: qty 2

## 2013-04-01 MED ORDER — ONDANSETRON HCL 4 MG/2ML IJ SOLN
4.0000 mg | Freq: Four times a day (QID) | INTRAMUSCULAR | Status: DC | PRN
  Administered 2013-04-04: 4 mg via INTRAVENOUS
  Filled 2013-04-01: qty 2

## 2013-04-01 MED ORDER — SODIUM BICARBONATE 8.4 % IV SOLN
50.0000 meq | Freq: Once | INTRAVENOUS | Status: AC
  Administered 2013-04-01: 50 meq via INTRAVENOUS
  Filled 2013-04-01: qty 50

## 2013-04-01 MED ORDER — HYDROMORPHONE HCL PF 1 MG/ML IJ SOLN
1.0000 mg | INTRAMUSCULAR | Status: DC | PRN
  Administered 2013-04-01 – 2013-04-07 (×13): 1 mg via INTRAVENOUS
  Filled 2013-04-01 (×15): qty 1

## 2013-04-01 MED ORDER — MORPHINE SULFATE ER 80 MG PO CP24: 80.0000 mg | ORAL_CAPSULE | Freq: Every day | ORAL | Status: DC | PRN

## 2013-04-01 MED ORDER — GUAIFENESIN-DM 100-10 MG/5ML PO SYRP
5.0000 mL | ORAL_SOLUTION | ORAL | Status: DC | PRN
  Filled 2013-04-01: qty 5

## 2013-04-01 MED ORDER — CALCIUM GLUCONATE 10 % IV SOLN
1.0000 g | INTRAVENOUS | Status: AC
  Filled 2013-04-01: qty 10

## 2013-04-01 MED ORDER — INSULIN ASPART 100 UNIT/ML IV SOLN
5.0000 [IU] | INTRAVENOUS | Status: AC
  Filled 2013-04-01: qty 0.05

## 2013-04-01 MED ORDER — INSULIN ASPART 100 UNIT/ML ~~LOC~~ SOLN
0.0000 [IU] | Freq: Every day | SUBCUTANEOUS | Status: DC
  Administered 2013-04-01 – 2013-04-04 (×3): 2 [IU] via SUBCUTANEOUS

## 2013-04-01 MED ORDER — FENTANYL CITRATE 0.05 MG/ML IJ SOLN
100.0000 ug | Freq: Once | INTRAMUSCULAR | Status: AC
  Administered 2013-04-01: 100 ug via INTRAVENOUS

## 2013-04-01 MED ORDER — SODIUM CHLORIDE 0.9 % IJ SOLN
3.0000 mL | Freq: Two times a day (BID) | INTRAMUSCULAR | Status: DC
  Administered 2013-04-01 – 2013-04-07 (×9): 3 mL via INTRAVENOUS

## 2013-04-01 MED ORDER — ONDANSETRON HCL 4 MG PO TABS: 4.0000 mg | ORAL_TABLET | Freq: Four times a day (QID) | ORAL | Status: DC | PRN

## 2013-04-01 MED ORDER — IPRATROPIUM-ALBUTEROL 0.5-2.5 (3) MG/3ML IN SOLN
3.0000 mL | RESPIRATORY_TRACT | Status: DC
  Administered 2013-04-01 – 2013-04-02 (×2): 3 mL via RESPIRATORY_TRACT
  Filled 2013-04-01 (×2): qty 3

## 2013-04-01 MED ORDER — ACETAMINOPHEN 650 MG RE SUPP: 650.0000 mg | Freq: Four times a day (QID) | RECTAL | Status: DC | PRN

## 2013-04-01 MED ORDER — ALBUTEROL SULFATE (2.5 MG/3ML) 0.083% IN NEBU: 10.0000 mg | INHALATION_SOLUTION | Freq: Once | RESPIRATORY_TRACT | Status: AC

## 2013-04-01 MED ORDER — RENA-VITE PO TABS
1.0000 | ORAL_TABLET | Freq: Every day | ORAL | Status: DC
Start: 2013-04-01 — End: 2013-04-07
  Administered 2013-04-01 – 2013-04-06 (×6): 1 via ORAL
  Filled 2013-04-01 (×7): qty 1

## 2013-04-01 MED ORDER — ALUM & MAG HYDROXIDE-SIMETH 200-200-20 MG/5ML PO SUSP: 30.0000 mL | Freq: Four times a day (QID) | ORAL | Status: DC | PRN

## 2013-04-01 MED ORDER — FENTANYL CITRATE 0.05 MG/ML IJ SOLN
100.0000 ug | Freq: Once | INTRAMUSCULAR | Status: AC
Start: 2013-04-01 — End: 2013-04-01
  Administered 2013-04-01: 100 ug via INTRAVENOUS
  Filled 2013-04-01: qty 2

## 2013-04-01 MED ORDER — INSULIN GLARGINE 100 UNIT/ML ~~LOC~~ SOLN
10.0000 [IU] | Freq: Every day | SUBCUTANEOUS | Status: DC
  Administered 2013-04-01 – 2013-04-06 (×5): 10 [IU] via SUBCUTANEOUS
  Filled 2013-04-01 (×7): qty 0.1

## 2013-04-01 MED ORDER — MORPHINE SULFATE ER 15 MG PO TBCR
45.0000 mg | EXTENDED_RELEASE_TABLET | Freq: Two times a day (BID) | ORAL | Status: DC | PRN
  Administered 2013-04-01 – 2013-04-04 (×2): 45 mg via ORAL
  Filled 2013-04-01 (×2): qty 3

## 2013-04-01 MED ORDER — GABAPENTIN 400 MG PO CAPS
400.0000 mg | ORAL_CAPSULE | Freq: Three times a day (TID) | ORAL | Status: DC
  Administered 2013-04-01 – 2013-04-03 (×4): 400 mg via ORAL
  Filled 2013-04-01 (×7): qty 1

## 2013-04-01 MED ORDER — FENTANYL CITRATE 0.05 MG/ML IJ SOLN
INTRAMUSCULAR | Status: AC
  Filled 2013-04-01: qty 2

## 2013-04-01 MED ORDER — INSULIN ASPART 100 UNIT/ML ~~LOC~~ SOLN
0.0000 [IU] | Freq: Three times a day (TID) | SUBCUTANEOUS | Status: DC
  Administered 2013-04-02: 1 [IU] via SUBCUTANEOUS
  Administered 2013-04-03 – 2013-04-04 (×2): 2 [IU] via SUBCUTANEOUS
  Administered 2013-04-04: 1 [IU] via SUBCUTANEOUS
  Administered 2013-04-04 – 2013-04-05 (×2): 3 [IU] via SUBCUTANEOUS
  Administered 2013-04-06: 2 [IU] via SUBCUTANEOUS
  Administered 2013-04-06: 5 [IU] via SUBCUTANEOUS

## 2013-04-01 MED ORDER — OXYCODONE HCL 5 MG PO TABS
30.0000 mg | ORAL_TABLET | Freq: Three times a day (TID) | ORAL | Status: DC | PRN
  Administered 2013-04-03 – 2013-04-06 (×4): 30 mg via ORAL
  Filled 2013-04-01 (×4): qty 6

## 2013-04-01 MED ORDER — NAPROXEN 500 MG PO TABS
500.0000 mg | ORAL_TABLET | Freq: Two times a day (BID) | ORAL | Status: DC
  Administered 2013-04-02 – 2013-04-06 (×8): 500 mg via ORAL
  Filled 2013-04-01 (×14): qty 1

## 2013-04-01 MED ORDER — DEXTROSE 50 % IV SOLN
1.0000 | Freq: Once | INTRAVENOUS | Status: AC
  Administered 2013-04-01: 50 mL via INTRAVENOUS
  Filled 2013-04-01: qty 50

## 2013-04-01 MED ORDER — PANTOPRAZOLE SODIUM 40 MG PO TBEC
40.0000 mg | DELAYED_RELEASE_TABLET | Freq: Every day | ORAL | Status: DC
  Administered 2013-04-02 – 2013-04-07 (×5): 40 mg via ORAL
  Filled 2013-04-01 (×4): qty 1

## 2013-04-01 MED ORDER — ALBUTEROL (5 MG/ML) CONTINUOUS INHALATION SOLN
INHALATION_SOLUTION | RESPIRATORY_TRACT | Status: AC
  Administered 2013-04-01: 16:00:00
  Filled 2013-04-01: qty 20

## 2013-04-01 MED ORDER — FOLIC ACID 1 MG PO TABS
1.0000 mg | ORAL_TABLET | Freq: Every day | ORAL | Status: DC
  Administered 2013-04-02 – 2013-04-07 (×5): 1 mg via ORAL
  Filled 2013-04-01 (×6): qty 1

## 2013-04-01 NOTE — ED Notes (Signed)
Joe Vincent, family member, [phone].  Please call when patient moves or if change in status.

## 2013-04-01 NOTE — ED Provider Notes (Signed)
Medical screening examination/treatment/procedure(s) were conducted as a shared visit with non-physician practitioner(s) and myself.  I personally evaluated the patient during the encounter.   53 yo male with complaint of months of weakness presenting with junctional rhythm.  On exam, nontoxic, no distress, heart sounds normal with bradycardic rate, lungs CTAB, abdomen soft and nontender with ostomy in place.  EKG showed junctional rhythm.  Labs showed K highly elevated.  Treated with Calcium, insulin, bicarb, albuterol, D50, with improvement in cardiac rhythm.  Will get emergent dialysis and admission.    Clinical Impression: 1. Acute hyperkalemia   2. Symptomatic bradycardia       Houston Siren III, MD 04/01/13 [phone]

## 2013-04-01 NOTE — H&P (Signed)
History and Physical       Hospital Admission Note Date: 04/01/2013  Patient name: Joe Vincent Medical record number: [id number] Date of birth: 09-06-1960 Age: 53 y.o. Gender: male  PCP: Curlene Labrum, MD  Primary nephrologist: Dr. Hinda Lenis in [place]  Chief Complaint:  Chest pain acute on chronic  HPI: Patient is a 53 year old male with history of diabetes, hypertension, ESRD on hemodialysis, TTS, history of colon cancer status post ileostomy presented to the ER for chest pain, shortness of breath and chills. History was obtained from the patient during his emergent hemodialysis. Patient reported that he was scheduled for hemodialysis TTS in [place], however he missed the hemodialysis on Thursday due to snow. Patient was rescheduled to have hemodialysis today however he woke off with 10/10 chest pain with shortness of breath nausea or dizziness and did not feel that he could make it to the hemodialysis center. Patient's friend called EMS and patient was brought to the ER. Patient complaint of having generalized weakness for about 6 months and chest pain for at least a month however forced today with sensation of "something sitting on my chest". Patient was given one nitroglycerin in route, patient was noticed to have severe bradycardia with his heart rate in 20s and in junctional rhythm. He is on beta blocker outpatient. Labs in the ER showed a potassium >7.7, BUN 87, creatinine 9.16 magnesium 1.4. EKG showed heart rate of 36. Patient was treated with calcium, insulin, bicarbonate, albuterol, D50 with improvement in his cardiac rhythm in the ER. Nephrology was consulted for emergent hemodialysis cardiology was also consulted by EDP.[name]    Review of Systems:  Constitutional: Denies fever, + chills, diaphoresis, poor appetite and fatigue.  HEENT: Denies photophobia, eye pain, redness, hearing loss, ear pain, congestion, sore throat,  rhinorrhea, sneezing, mouth sores, trouble swallowing, neck pain, neck stiffness and tinnitus.   Respiratory: + SOB, DOE, cough, chest tightness,  and wheezing.   Cardiovascular:+  chest pain,+ irregular heartbeat and  leg swelling.  Gastrointestinal: Denies nausea, vomiting, abdominal pain, diarrhea, constipation, blood in stool and abdominal distention.  Genitourinary: Denies dysuria, urgency, frequency, hematuria, flank pain and difficulty urinating.  Musculoskeletal: Denies myalgias, back pain, joint swelling, arthralgias and gait problem.  Skin: Denies pallor, rash and wound.  Neurological: Denies seizures,numbness and headaches.  patient complains of significant weakness  Hematological: Denies adenopathy. Easy bruising, personal or family bleeding history  Psychiatric/Behavioral: Denies suicidal ideation, mood changes, confusion, nervousness, sleep disturbance and agitation  Past Medical History: Past Medical History  Diagnosis Date  . Diabetes mellitus   . Hypertension   . ESRD on hemodialysis     On HD in [place] at [facility].  Nephrologist is Dr. Hinda Lenis. Gets HD on TTS schedule.   [name] Retinopathy   . Peripheral neuropathy   . Chest pain     a. 05/2011 Cath: nonobs dzs.  . Myocardial infarction 2000  . Depression   . Liver disease   . Cancer     Colon  . GERD (gastroesophageal reflux disease)   . CAD (coronary artery disease)   . OA (osteoarthritis)     Hx: of  . Anxiety   . Esophagitis 2013    and gastritis by EGD   Past Surgical History  Procedure Laterality Date  . Colostomy  Ileostomy    colon cancer  . Amputation      left pinky toe  . Esophagogastroduodenoscopy  05/23/2011    Procedure: ESOPHAGOGASTRODUODENOSCOPY (EGD);  Surgeon: Beryle Beams, MD;  Location: [facility];  Service: Endoscopy;  Laterality: N/A;  . Colon surgery  2009  . Cardiac catheterization  05/22/2011    [place], L[place] OK, D1 80-90% at bifurcation, OM1 40-50%, OM2 50%, RCA irregular.  Ao root no dilatation or dissection. Recommendations: I'm not sure how to explain this patient's presentation of severe substernal chest pain with associated vomiting. He does not have an obvious coronary culprit vessel and there is no obvious proximal aortic pathology. He is medically very complex  . Eye surgery    . Av fistula placement Right 03/02/2013    Procedure: INSERTION OF ARTERIOVENOUS (AV) GORE-TEX GRAFT ARM - RIGHT FOREARM VERSUS UPPER ARM;  Surgeon: Mal Misty, MD;  Location: [facility];  Service: Vascular;  Laterality: Right;    Medications: Prior to Admission medications   Medication Sig Start Date End Date Taking? Authorizing Provider  b complex-vitamin c-folic acid (NEPHRO-VITE) 0.8 MG TABS tablet Take 1 tablet by mouth at bedtime.   Yes Historical Provider, MD  ciprofloxacin (CIPRO) 500 MG tablet Take 500 mg by mouth daily with breakfast.   Yes Historical Provider, MD  folic acid (FOLVITE) 1 MG tablet Take 1 mg by mouth daily.   Yes Historical Provider, MD  gabapentin (NEURONTIN) 400 MG capsule Take 400 mg by mouth 3 (three) times daily.  03/01/13  Yes Historical Provider, MD  HYDROcodone-acetaminophen (NORCO/VICODIN) 5-325 MG per tablet Take 1 tablet by mouth every 6 (six) hours as needed for moderate pain.   Yes Historical Provider, MD  ibuprofen (ADVIL,MOTRIN) 800 MG tablet Take 800 mg by mouth every 8 (eight) hours as needed for mild pain.  03/11/13  Yes Historical Provider, MD  insulin aspart (NOVOLOG) 100 UNIT/ML injection Inject 4-12 Units into the skin 3 (three) times daily as needed for high blood sugar (Uses a sliding scale at home).   Yes Historical Provider, MD  insulin glargine (LANTUS) 100 UNIT/ML injection Inject 10 Units into the skin at bedtime.    Yes Historical Provider, MD  metoprolol tartrate (LOPRESSOR) 25 MG tablet Take 25 mg by mouth 2 (two) times daily.  03/08/13  Yes Historical Provider, MD  midodrine (PROAMATINE) 10 MG tablet Take 10 mg by mouth daily.  02/16/13   Yes Historical Provider, MD  morphine (KADIAN) 80 MG 24 hr capsule Take 80 mg by mouth daily as needed for pain.   Yes Historical Provider, MD  naproxen (NAPROSYN) 500 MG tablet Take 500 mg by mouth 2 (two) times daily with a meal.   Yes Historical Provider, MD  oxycodone (ROXICODONE) 30 MG immediate release tablet Take 30 mg by mouth every 8 (eight) hours as needed for pain.   Yes Historical Provider, MD  pantoprazole (PROTONIX) 40 MG tablet Take 40 mg by mouth daily. 08/19/12  Yes Kathie Dike, MD  SPIRIVA HANDIHALER 18 MCG inhalation capsule Place 18 mcg into inhaler and inhale daily.  03/08/13  Yes Historical Provider, MD  oxyCODONE-acetaminophen (PERCOCET/ROXICET) 5-325 MG per tablet Take 1 tablet by mouth every 6 (six) hours as needed for moderate pain or severe pain.    Historical Provider, MD  penicillin v potassium (VEETID) 500 MG tablet Take 500 mg by mouth See admin instructions. Take 1 tablet by mouth 4 times a day until all gone. Patient states he started a week ago from today(04-01-13) patient has 8 tablets left in the bottle 03/11/13   Historical Provider, MD    Allergies:   Allergies  Allergen Reactions  . Codeine Swelling and Rash  .  Prednisone Other (See Comments)    unknown    Social History:  reports that he has been smoking Cigarettes.  He has a 17.5 pack-year smoking history. He has quit using smokeless tobacco. Patient reports smoking at least half pack per day. He reports that he does not drink alcohol or use illicit drugs.he lives at home and uses a walker for ambulation   Family History: Family History  Problem Relation Age of Onset  . Cancer Mother   . Heart disease Father   . Cancer Father     Physical Exam: Blood pressure 129/71, temperature 97.4 F (36.3 C), resp. rate 13, SpO2 100.00%. General: Alert, awake, oriented x3, in no acute distress. HEENT: normocephalic, atraumatic, anicteric sclera, pink conjunctiva, pupils equal and reactive to light and  accomodation, oropharynx clear Neck: supple, no masses or lymphadenopathy, no goiter, no bruits  Heart:  irregularly irregular, no murmurs, rubs or gallops. Lungs: Clear to auscultation bilaterally, no wheezing, rales or rhonchi. Abdomen: Soft, nontender, nondistended, positive bowel sounds, + ileostomy  Extremities: No clubbing, cyanosis or edema with positive pedal pulses. Neuro: Grossly intact, no focal neurological deficits, strength 5/5 upper and lower extremities bilaterally Psych: alert and oriented x 3, normal mood and affect Skin: no rashes or lesions, warm and dry   LABS on Admission:  Basic Metabolic Panel:  Recent Labs Lab 04/01/13 1316 04/01/13 1433  NA  --  132*  K  --  >7.7*  CL  --  97  CO2  --  13*  GLUCOSE  --  225*  BUN  --  87*  CREATININE  --  9.16*  CALCIUM  --  8.6  MG 1.4*  --    Liver Function Tests:  Recent Labs Lab 04/01/13 1433  AST 28  ALT 34  ALKPHOS 201*  BILITOT <0.2*  PROT 7.8  ALBUMIN 3.3*   No results found for this basename: LIPASE, AMYLASE,  in the last 168 hours No results found for this basename: AMMONIA,  in the last 168 hours CBC:  Recent Labs Lab 04/01/13 1433  WBC 10.1  NEUTROABS 7.6  HGB 13.5  HCT 40.7  MCV 94.2  PLT 157   Cardiac Enzymes: No results found for this basename: CKTOTAL, CKMB, CKMBINDEX, TROPONINI,  in the last 168 hours BNP: No components found with this basename: POCBNP,  CBG:  Recent Labs Lab 04/01/13 1615  GLUCAP 166*     Radiological Exams on Admission: Dg Chest Port 1 View  04/01/2013   CLINICAL DATA:  Smoker.  Chest pain.  EXAM: PORTABLE CHEST - 1 VIEW  COMPARISON:  DG CHEST 2 VIEW dated 08/17/2012  FINDINGS: Large bore dual-lumen catheter noted with tip projected over right atrium. Lungs are clear. No pleural effusion or pneumothorax. Heart size normal. Pacing pads noted over the chest.  IMPRESSION: 1. No acute cardiopulmonary disease. 2. Dialysis catheter noted in good anatomic  position.   Electronically Signed   By: Marcello Moores  Register   On: 04/01/2013 13:30    Assessment/Plan Principal Problem:   Severe bradycardia-symptomatic in the setting of severe hyperkalemia, on beta blocker outpatient, recent history of rapid atrial fibrillation - Patient is to undergo emergent hemodialysis  - Currently heart rate in 60s on telemetry, cardiology has been consulted. Will rule out acute ACS, obtain 2-D echocardiogram once potassium has normalized and completed hemodialysis - Hold beta blocker -He has a history of recent rapid A. fib, was placed on beta blocker however not considered candidate for anticoagulation due  to thrombocytopenia and fall risk.   Active Problems: Atypical chest pain: Patient reports chest pain for last one month now worsening today in and also pleuritic in nature.   - Patient had a cardiac cath in 2013 which showed noncritical CAD, 2-D echo done in [facility] in 12 2014 showed EF of 55-60% - Will check echocardiogram after HD, rule out acute ACS  - Will obtain VQ scan to rule out PE  - Cardiology following  - Patient is on multiple narcotics including morphine, oxycodone, for now we'll have to continue, and PRN Dilaudid for breakthrough pain   Hyperkalemia  - Treated in ED with calcium, insulin, bicarbonate, albuterol, D50, recheck potassium level     Diabetes mellitus - Will place on sliding scale insulin, place on Lantus 10 units qhs, obtain hemoglobin A1c     Hypertension - Currently stable     ESRD on hemodialysis secondary to diabetes and hypertension, recently started on hemodialysis in December 2014. Patient appears to have noncompliance with HD as outpatient. - Nephrology has been consulted, for recommendation regarding HD schedule  COPD with history of nicotine abuse - Continue albuterol and Atrovent nebs, currently no wheezing  Thrombocytopenia: Chronic. Patient was evaluated in [facility] during his previous  admission and was found to have positive hep C antibody, some underlying liver disease. Hit panel was negative, hematology was consulted and had felt it was likely multifactorial etiology secondary to sepsis, pneumonia and complement of drug induced from her cytopenia. Peripheral smear, hemolysis labs were normal, B12, RBC folate were normal, patient had mild iron deficiency. - Patient has been following [facility], Dr. Caprice Beaver  - Will continue to follow counts  DVT prophylaxis: SCDs     CODE STATUS: Discussed in detail with the patient, he wants DNR/DNI status  Family Communication: Admission, patients condition and plan of care including tests being ordered have been discussed with the patient and ...Marland KitchenMarland KitchenMarland Kitchen who indicates understanding and agree with the plan and Code Status   Further plan will depend as patient's clinical course evolves and further radiologic and laboratory data become available.      Time Spent on Admission:  one-hour   Lacie Landry M.D. [facility] 04/01/2013, 5:32 PM Pager: [phone]  If 7PM-7AM, please contact night-coverage [email or web address] Password [email or web address]

## 2013-04-01 NOTE — Consult Note (Signed)
Referring Provider: No ref. provider found Primary Care Physician:  Curlene Labrum, MD Primary Nephrologist:  Dr. Tracey Harries  Reason for Consultation:  Medical management of ESRD Anemia and Bone mineral disorder emergent dialysis for hyperkalemia  HPI: Patient is a 53 year old male with history of diabetes, hypertension, ESRD on hemodialysis, TTS, history of colon cancer status post ileostomy presented to the ER for chest pain, shortness of breath and chills. History was obtained from the patient during his emergent hemodialysis. Patient reported that he was scheduled for hemodialysis TTS in [place], however he missed the hemodialysis on Thursday due to snow  Diabetes with diabetic nephropathy . Lives alone very little support. Children in [place]  Does not appear to keep in touch. Divorced from wife. Originally from [place].    Past Medical History  Diagnosis Date  . Diabetes mellitus   . Hypertension   . ESRD on hemodialysis     On HD in [place] at [facility].  Nephrologist is Dr. Hinda Lenis. Gets HD on TTS schedule.   [name] Retinopathy   . Peripheral neuropathy   . Chest pain     a. 05/2011 Cath: nonobs dzs.  . Myocardial infarction 2000  . Depression   . Liver disease   . Cancer     Colon  . GERD (gastroesophageal reflux disease)   . CAD (coronary artery disease)   . OA (osteoarthritis)     Hx: of  . Anxiety   . Esophagitis 2013    and gastritis by EGD    Past Surgical History  Procedure Laterality Date  . Colostomy  Ileostomy    colon cancer  . Amputation      left pinky toe  . Esophagogastroduodenoscopy  05/23/2011    Procedure: ESOPHAGOGASTRODUODENOSCOPY (EGD);  Surgeon: Beryle Beams, MD;  Location[facility] ENDOSCOPY;  Service: Endoscopy;  Laterality: N/A;  . Colon surgery  2009  . Cardiac catheterization  05/22/2011    [place] OK, LAD OK, D1 80-90% at bifurcation, OM1 40-50%, OM2 50%, RCA irregular. Ao root no dilatation or dissection. Recommendations: I'm not sure  how to explain this patient's presentation of severe substernal chest pain with associated vomiting. He does not have an obvious coronary culprit vessel and there is no obvious proximal aortic pathology. He is medically very complex  . Eye surgery    . Av fistula placement Right 03/02/2013    Procedure: INSERTION OF ARTERIOVENOUS (AV) GORE-TEX GRAFT ARM - RIGHT FOREARM VERSUS UPPER ARM;  Surgeon: Mal Misty, MD;  Location: [facility];  Service: Vascular;  Laterality: Right;    Prior to Admission medications   Medication Sig Start Date End Date Taking? Authorizing Provider  b complex-vitamin c-folic acid (NEPHRO-VITE) 0.8 MG TABS tablet Take 1 tablet by mouth at bedtime.   Yes Historical Provider, MD  ciprofloxacin (CIPRO) 500 MG tablet Take 500 mg by mouth daily with breakfast.   Yes Historical Provider, MD  folic acid (FOLVITE) 1 MG tablet Take 1 mg by mouth daily.   Yes Historical Provider, MD  gabapentin (NEURONTIN) 400 MG capsule Take 400 mg by mouth 3 (three) times daily.  03/01/13  Yes Historical Provider, MD  HYDROcodone-acetaminophen (NORCO/VICODIN) 5-325 MG per tablet Take 1 tablet by mouth every 6 (six) hours as needed for moderate pain.   Yes Historical Provider, MD  ibuprofen (ADVIL,MOTRIN) 800 MG tablet Take 800 mg by mouth every 8 (eight) hours as needed for mild pain.  03/11/13  Yes Historical Provider, MD  insulin aspart (NOVOLOG) 100  UNIT/ML injection Inject 4-12 Units into the skin 3 (three) times daily as needed for high blood sugar (Uses a sliding scale at home).   Yes Historical Provider, MD  insulin glargine (LANTUS) 100 UNIT/ML injection Inject 10 Units into the skin at bedtime.    Yes Historical Provider, MD  metoprolol tartrate (LOPRESSOR) 25 MG tablet Take 25 mg by mouth 2 (two) times daily.  03/08/13  Yes Historical Provider, MD  midodrine (PROAMATINE) 10 MG tablet Take 10 mg by mouth daily.  02/16/13  Yes Historical Provider, MD  morphine (KADIAN) 80 MG 24 hr capsule Take 80 mg  by mouth daily as needed for pain.   Yes Historical Provider, MD  naproxen (NAPROSYN) 500 MG tablet Take 500 mg by mouth 2 (two) times daily with a meal.   Yes Historical Provider, MD  oxycodone (ROXICODONE) 30 MG immediate release tablet Take 30 mg by mouth every 8 (eight) hours as needed for pain.   Yes Historical Provider, MD  pantoprazole (PROTONIX) 40 MG tablet Take 40 mg by mouth daily. 08/19/12  Yes Kathie Dike, MD  SPIRIVA HANDIHALER 18 MCG inhalation capsule Place 18 mcg into inhaler and inhale daily.  03/08/13  Yes Historical Provider, MD  oxyCODONE-acetaminophen (PERCOCET/ROXICET) 5-325 MG per tablet Take 1 tablet by mouth every 6 (six) hours as needed for moderate pain or severe pain.    Historical Provider, MD  penicillin v potassium (VEETID) 500 MG tablet Take 500 mg by mouth See admin instructions. Take 1 tablet by mouth 4 times a day until all gone. Patient states he started a week ago from today(04-01-13) patient has 8 tablets left in the bottle 03/11/13   Historical Provider, MD    Current Facility-Administered Medications  Medication Dose Route Frequency Provider Last Rate Last Dose  . calcium gluconate inj 10% (1 g) URGENT USE ONLY!  1 g Intravenous STAT Abigail Harris, PA-C      . fentaNYL (SUBLIMAZE) 0.05 MG/ML injection           . insulin aspart (novoLOG) injection 5 Units  5 Units Intravenous STAT Margarita Mail, PA-C       Current Outpatient Prescriptions  Medication Sig Dispense Refill  . b complex-vitamin c-folic acid (NEPHRO-VITE) 0.8 MG TABS tablet Take 1 tablet by mouth at bedtime.      . ciprofloxacin (CIPRO) 500 MG tablet Take 500 mg by mouth daily with breakfast.      . folic acid (FOLVITE) 1 MG tablet Take 1 mg by mouth daily.      [name] gabapentin (NEURONTIN) 400 MG capsule Take 400 mg by mouth 3 (three) times daily.       [name] HYDROcodone-acetaminophen (NORCO/VICODIN) 5-325 MG per tablet Take 1 tablet by mouth every 6 (six) hours as needed for moderate pain.      [name]  ibuprofen (ADVIL,MOTRIN) 800 MG tablet Take 800 mg by mouth every 8 (eight) hours as needed for mild pain.       [name] insulin aspart (NOVOLOG) 100 UNIT/ML injection Inject 4-12 Units into the skin 3 (three) times daily as needed for high blood sugar (Uses a sliding scale at home).      . insulin glargine (LANTUS) 100 UNIT/ML injection Inject 10 Units into the skin at bedtime.       . metoprolol tartrate (LOPRESSOR) 25 MG tablet Take 25 mg by mouth 2 (two) times daily.       . midodrine (PROAMATINE) 10 MG tablet Take 10 mg by mouth daily.       [name]  morphine (KADIAN) 80 MG 24 hr capsule Take 80 mg by mouth daily as needed for pain.      . naproxen (NAPROSYN) 500 MG tablet Take 500 mg by mouth 2 (two) times daily with a meal.      . oxycodone (ROXICODONE) 30 MG immediate release tablet Take 30 mg by mouth every 8 (eight) hours as needed for pain.      . pantoprazole (PROTONIX) 40 MG tablet Take 40 mg by mouth daily.      [name] SPIRIVA HANDIHALER 18 MCG inhalation capsule Place 18 mcg into inhaler and inhale daily.       [name] oxyCODONE-acetaminophen (PERCOCET/ROXICET) 5-325 MG per tablet Take 1 tablet by mouth every 6 (six) hours as needed for moderate pain or severe pain.      [name] penicillin v potassium (VEETID) 500 MG tablet Take 500 mg by mouth See admin instructions. Take 1 tablet by mouth 4 times a day until all gone. Patient states he started a week ago from today(04-01-13) patient has 8 tablets left in the bottle        Allergies as of 04/01/2013 - Review Complete 04/01/2013  Allergen Reaction Noted  . Codeine Swelling and Rash 04/09/2011  . Prednisone Other (See Comments) 04/01/2013    Family History  Problem Relation Age of Onset  . Cancer Mother   . Heart disease Father   . Cancer Father     History   Social History  . Marital Status: Divorced    Spouse Name: N/A    Number of Children: N/A  . Years of Education: N/A   Occupational History  . Disabled    Social History Main Topics  .  Smoking status: Current Every Day Smoker -- 0.50 packs/day for 35 years    Types: Cigarettes  . Smokeless tobacco: Former Systems developer     Comment: Quit chewing > 10 years ago, formerly smoked more.  . Alcohol Use: No  . Drug Use: No  . Sexual Activity: No   Other Topics Concern  . Not on file   Social History Narrative   All relatives are deceased except a brother-in-law. Mostly cancer but his father had CAD also. Lives alone in apartment. Good friend nearby.    Review of Systems: Gen: Denies any fever, chills, sweats, anorexia, +  fatigue, weakness, malaise, weight loss HEENT:blurred vision CV: + chest pain, , palpitations,, claudication. Pain and numbness in legs  Resp: Denies dyspnea at rest, dyspnea with exercise, cough, sputum, wheezing, coughing up blood, and pleurisy. GI: Denies vomiting blood, jaundice, and fecal incontinence.   Denies dysphagia or odynophagia. GU : ESRD HD  Maturing Brachiobasilic in right arm MS: Could not walk today having great difficulty in moving lower extremities with hyperkalemia that has been more prominent Derm: Denies rash, itching, dry skin, hives, moles, warts, or unhealing ulcers.  Psych: Appears depression Heme: Denies bruising, bleeding, and enlarged lymph nodes. Neuro: No headache.  No diplopia. No dysarthria.  No dysphasia.  No history of CVA.  No Seizures.  Endocrine Dm .  No Thyroid disease.  No Adrenal disease.  Physical Exam: Vital signs in last 24 hours: Temp:  [96.9 F (36.1 C)-97.4 F (36.3 C)] 96.9 F (36.1 C) (02/27 1710) Pulse Rate:  [68-117] 117 (02/27 1900) Resp:  [12-18] 18 (02/27 1900) BP: (103-147)/(60-102) 103/73 mmHg (02/27 1900) SpO2:  [100 %] 100 % (02/27 1900)   General:   Older than stated age Head:  Normocephalic and atraumatic. Eyes:  Sclera  clear, no icterus.   Conjunctiva pink. Ears:  Normal auditory acuity. Nose:  No deformity, discharge,  or lesions. Mouth:  No deformity or lesions, dentition poor Neck:   Supple; no masses or thyromegaly. JVP not elevated Lungs:  Clear throughout to auscultation.   No wheezes, crackles, or rhonchi. No acute distress. Heart:  Regular rate and rhythm; no murmurs, clicks, rubs,  or gallops. Abdomen:  Soft, nontender and nondistended. No masses, hepatosplenomegaly or hernias noted. Normal bowel sounds, without guarding, and without rebound.  Ostomy LLQ Msk:  Symmetrical without gross deformities. Normal posture. Pulses:  No carotid, renal, femoral bruits. Pedal pulses diminished Extremities:  Without clubbing or edema. Neurologic:  Alert and  oriented x4;  grossly normal neurologically. Skin:  Intact without significant lesions or rashes. Psychiatric  Appears overwhelmed with being alone and having chronic debilitating health issues  Intake/Output from previous day:   Intake/Output this shift:    Lab Results:  Recent Labs  04/01/13 1433  WBC 10.1  HGB 13.5  HCT 40.7  PLT 157   BMET  Recent Labs  04/01/13 1433 04/01/13 1719  NA 132*  --   K >7.7* >7.7*  CL 97  --   CO2 13*  --   GLUCOSE 225*  --   BUN 87*  --   CREATININE 9.16*  --   CALCIUM 8.6  --    LFT  Recent Labs  04/01/13 1433  PROT 7.8  ALBUMIN 3.3*  AST 28  ALT 34  ALKPHOS 201*  BILITOT <0.2*   PT/INR No results found for this basename: LABPROT, INR,  in the last 72 hours Hepatitis Panel No results found for this basename: HEPBSAG, HCVAB, HEPAIGM, HEPBIGM,  in the last 72 hours  Studies/Results: Dg Chest Port 1 View  04/01/2013   CLINICAL DATA:  Smoker.  Chest pain.  EXAM: PORTABLE CHEST - 1 VIEW  COMPARISON:  DG CHEST 2 VIEW dated 08/17/2012  FINDINGS: Large bore dual-lumen catheter noted with tip projected over right atrium. Lungs are clear. No pleural effusion or pneumothorax. Heart size normal. Pacing pads noted over the chest.  IMPRESSION: 1. No acute cardiopulmonary disease. 2. Dialysis catheter noted in good anatomic position.   Electronically Signed   By: Marcello Moores   Register   On: 04/01/2013 13:30    Assessment/Plan: 53 year old male with history of diabetes, hypertension, ESRD on hemodialysis, TTS, history of colon cancer status post ileostomy presented to the ER for chest pain, shortness of breath and chills. History was obtained from the patient during his emergent hemodialysis. Potassium > 7.7  1. ESRD   Dialysis TTS missed treatment Thursday presents for urgent dialysis for hyperkalemia  2. HTN  Controlled  3. Anemia stable  4. Bone Mineral will evaluate calcium and phosphorus  5. Diabetes   6. Hyperkalemia 0 K dialysis and recheck   7. History of colon Cancer    LOS: 0 Joe Vincent W @TODAY @7 :26 PM

## 2013-04-01 NOTE — ED Notes (Signed)
Patient sister, Melford Aase, [phone]

## 2013-04-01 NOTE — Consult Note (Signed)
CARDIOLOGY CONSULT NOTE   Patient ID: Joe Vincent MRN: [id number], DOB/AGE: 1961-01-19 53 y.o. Date of Encounter: 04/01/2013  Primary Physician: Curlene Labrum, MD  [address], [place], [place] [address]  [phone] Primary Cardiologist: Lives in [place], initially seen by [facility] 2013  Chief Complaint:  Chest pain, bradycardia  HPI: Joe Vincent is a 53 y.o. male with a history of non-critical CAD by cath 2013.   He has been having problems with dizziness for 8 months. It has gotten worse, with presyncope for a month. He gets this every day. Ambulation is limited and he has problems with ADLs. For about 6 months, he has been having problems with hypotension at HD. For about a month, he has been having bradycardia at HD as well. Describes 2 week hx palpitations, skipping and racing.   Today, he was scheduled for HD (off schedule because of the snow although he has missed other appts with no weather probs). He woke with 10/10 chest pain, crushing pain, SOB, slight nausea, light-headed, aware of irreg HR. A little diaphoretic. No medications at all. Crawled to friend's apartment and the friend called 911. EMS transported to ER. In ER, pt has had fentanyl 100 mcg. Pain is worse with deep inspiration or cough. No change with fentanyl. Never had chest pain this bad before. Symptoms are similar to pre-admission symptoms in December, when his K+ was 9.1 and he had PNA with sepsis and VDRF.    Past Medical History  Diagnosis Date  . Diabetes mellitus   . Hypertension   . ESRD on hemodialysis     On HD in [place] at [facility] [place] unit.  Nephrologist is Dr. Hinda Lenis. Gets HD on TTS schedule.   [name] Retinopathy   . Peripheral neuropathy   . Chest pain     a. 05/2011 Cath: nonobs dzs.  . Myocardial infarction 2000  . Depression   . Liver disease   . Cancer     Colon  . GERD (gastroesophageal reflux disease)   . CAD (coronary artery disease)   . OA (osteoarthritis)    Hx: of  . Anxiety   . Esophagitis 2013    and gastritis by EGD    Surgical History:  Past Surgical History  Procedure Laterality Date  . Colostomy  Ileostomy    colon cancer  . Amputation      left pinky toe  . Esophagogastroduodenoscopy  05/23/2011    Procedure: ESOPHAGOGASTRODUODENOSCOPY (EGD);  Surgeon: Beryle Beams, MD;  Location: [facility] ENDOSCOPY;  Service: Endoscopy;  Laterality: N/A;  . Colon surgery  2009  . Cardiac catheterization  05/22/2011    [place] OK, LAD OK, D1 80-90% at bifurcation, OM1 40-50%, OM2 50%, RCA irregular. Ao root no dilatation or dissection. Recommendations: I'm not sure how to explain this patient's presentation of severe substernal chest pain with associated vomiting. He does not have an obvious coronary culprit vessel and there is no obvious proximal aortic pathology. He is medically very complex  . Eye surgery    . Av fistula placement Right 03/02/2013    Procedure: INSERTION OF ARTERIOVENOUS (AV) GORE-TEX GRAFT ARM - RIGHT FOREARM VERSUS UPPER ARM;  Surgeon: Mal Misty, MD;  Location: [facility];  Service: Vascular;  Laterality: Right;    On d/c from [facility] in December 2014 START taking these medications  Details  ciprofloxacin (CIPRO) 500 MG tablet Take 1 tablet (500 mg total) by mouth daily., Starting 01/27/2013, Until Discontinued folic  acid (FOLVITE) 1 MG tablet Take 1 tablet (1 mg total) by mouth daily., Starting 01/27/2013, Last dose on Fri 01/27/14 metoPROLOL tartrate (LOPRESSOR) 25 MG tablet Take 1 tablet (25 mg total) by mouth 2 times daily., Starting 01/27/2013, Until Discontinued pantoprazole (PROTONIX) 40 MG tablet Take 1 tablet (40 mg total) by mouth daily., Starting 01/27/2013, Last dose on Fri 01/27/14 tiotropium (SPIRIVA) 18 mcg inhalation capsule Place 1 capsule into inhaler and inh daily., Starting 01/27/2013, Last dose on Fri 01/27/14  CONTINUE these medications which have NOT CHANGED  Details  diazepam (VALIUM) 5 MG tablet Take 5 mg by  mouth every 6 (six) hours as needed for Anxiety., Until Discontinued, Historical Med  insulin aspart (NOVOLOG FLEXPEN) 100 unit/mL InPn Inject 7 Units into the skin. Plus sliding scale, Until Discontinued, Historical Med  insulin glargine (LANTUS) 100 unit/mL injection Inject 10 Units into the skin nightly., Until Discontinued, Historical Med  morphine (MS CONTIN) 60 MG 12 hr tablet Take 60 mg by mouth 2 times daily., Until Discontinued, Historical Med  oxyCODONE (ROXICODONE) 30 MG immediate release tablet Take 30 mg by mouth every 4 (four) hours as needed for Pain., Until Discontinued, Historical Med    PER ER NOTE: MS Contin 60 mg bid, morphine 80 mg qd, oxycodone 30 mg tid PRN  I have reviewed the patient's current medications. Prior to Admission medications   Medication Sig  b complex-vitamin c-folic acid (NEPHRO-VITE) 0.8 MG TABS tablet Take 1 tablet by mouth at bedtime.  folic acid (FOLVITE) 1 MG tablet Take 1 mg by mouth daily.  gabapentin (NEURONTIN) 400 MG capsule Take 400 mg by mouth 3 (three) times daily.   ibuprofen (ADVIL,MOTRIN) 800 MG tablet Take 800 mg by mouth every 8 (eight) hours as needed for mild pain.   insulin aspart (NOVOLOG) 100 UNIT/ML injection Inject 4-12 Units into the skin 3 (three) times daily as needed for high blood sugar (Uses a sliding scale at home).  insulin glargine (LANTUS) 100 UNIT/ML injection Inject 10 Units into the skin at bedtime.   metoprolol tartrate (LOPRESSOR) 25 MG tablet Take 25 mg by mouth 2 (two) times daily.   midodrine (PROAMATINE) 10 MG tablet Take 10 mg by mouth daily.   naproxen (NAPROSYN) 500 MG tablet Take 500 mg by mouth 2 (two) times daily with a meal.  oxyCODONE-acetaminophen (PERCOCET/ROXICET) 5-325 MG per tablet Take 1 tablet by mouth every 6 (six) hours as needed for moderate pain or severe pain.  pantoprazole (PROTONIX) 40 MG tablet Take 40 mg by mouth daily.  penicillin v potassium (VEETID) 500 MG tablet Take 500 mg by mouth  See admin instructions. Take 1 tablet by mouth 4 times a day until all gone. Patient states he started a week ago from today(04-01-13) patient has 8 tablets left in the bottle  SPIRIVA HANDIHALER 18 MCG inhalation capsule   traZODone (DESYREL) 50 MG tablet Take 50 mg by mouth daily as needed for sleep.     Allergies:  Allergies  Allergen Reactions  . Codeine Swelling and Rash    History   Social History  . Marital Status: Divorced    Spouse Name: N/A    Number of Children: N/A  . Years of Education: N/A   Occupational History  . Disabled    Social History Main Topics  . Smoking status: Current Every Day Smoker -- 0.50 packs/day for 35 years    Types: Cigarettes  . Smokeless tobacco: Former Systems developer     Comment: Quit chewing >  10 years ago, formerly smoked more.  . Alcohol Use: No  . Drug Use: No  . Sexual Activity: No   Other Topics Concern  . Not on file   Social History Narrative   All relatives are deceased except a brother-in-law. Mostly cancer but his father had CAD also. Lives alone in apartment. Good friend nearby.    Family History  Problem Relation Age of Onset  . Cancer Mother   . Heart disease Father   . Cancer Father    Family Status  Relation Status Death Age  . Mother Deceased   . Father Deceased     Review of Systems:   Full 14-point review of systems otherwise negative except as noted above.  Physical Exam: Blood pressure 129/71, temperature 97.4 F (36.3 C), resp. rate 13, SpO2 100.00%. General: Well developed, well nourished,male in no acute distress. Head: Normocephalic, atraumatic, sclera non-icteric, no xanthomas, nares are without discharge. Dentition:  Neck: No carotid bruits. JVD not elevated. No thyromegally Lungs: Good expansion bilaterally. without wheezes or rhonchi.  Heart: IRRegular rate and rhythm with S1 S2.  No S3 or S4.  No murmur, no rubs, or gallops appreciated. Abdomen: Soft, non-tender, non-distended with normoactive bowel  sounds. No hepatomegaly. No rebound/guarding. No obvious abdominal masses. Msk:  Strength and tone appear normal for age. No joint deformities or effusions, no spine or costo-vertebral angle tenderness. Extremities: No clubbing or cyanosis. No edema.  Distal pedal pulses are 2+ in 4 extrem Neuro: Alert and oriented X 3. Moves all extremities spontaneously. No focal deficits noted. Psych:  Responds to questions appropriately with a normal affect. Skin: No rashes or lesions noted  Labs:   Lab Results  Component Value Date   WBC 10.1 04/01/2013   HGB 13.5 04/01/2013   HCT 40.7 04/01/2013   MCV 94.2 04/01/2013   PLT 157 04/01/2013     Recent Labs Lab 04/01/13 1433  NA 132*  K >7.7*  CL 97  CO2 13*  BUN 87*  CREATININE 9.16*  CALCIUM 8.6  PROT 7.8  BILITOT <0.2*  ALKPHOS 201*  ALT 34  AST 28  GLUCOSE 225*    Recent Labs  04/01/13 1344  TROPIPOC 0.01    Radiology/Studies: Dg Chest Port 1 View 04/01/2013   CLINICAL DATA:  Smoker.  Chest pain.  EXAM: PORTABLE CHEST - 1 VIEW  COMPARISON:  DG CHEST 2 VIEW dated 08/17/2012  FINDINGS: Large bore dual-lumen catheter noted with tip projected over right atrium. Lungs are clear. No pleural effusion or pneumothorax. Heart size normal. Pacing pads noted over the chest.  IMPRESSION: 1. No acute cardiopulmonary disease. 2. Dialysis catheter noted in good anatomic position.   Electronically Signed   By: Marcello Moores  Register   On: 04/01/2013 13:30     Cardiac Cath:  05/22/2011 Left mainstem: The left main is patent. There is minor irregularity but no high-grade stenosis is present.  Left anterior descending (LAD): The LAD is a large caliber vessel that reaches the left ventricular apex. There is mild nonobstructive plaque in the proximal vessel. The mid vessel also has mild disease. The vessel extends down and wraps around the left ventricular apex. The first diagonal branch provides and twin vessels and at the bifurcation point there is severe 80-90%  stenosis of the diagonal branch.  Left circumflex (LCx): The circumflex is also a large caliber vessel. There is a moderate-sized first OM with 40-50% proximal stenosis. The second OM device and twin vessels and also has 50%  proximal stenosis. There is no high-grade disease in the left circumflex.  Right coronary artery (RCA): The RCA is dominant. There is no significant obstructive disease throughout. It gives off an acute marginal branch, a PDA branch, and a small posterolateral branch. There is mild diffuse irregularity but no significant stenosis.  Aortic root angiography: The aortic root and ascending aorta appear angiographically normal. There is no significant dilatation. There is no evidence of aortic dissection. The aortic valve appears competent with no aortic insufficiency. There is normal distribution of the head and neck vessels.  Final Conclusions:  1. Mild nonobstructive LAD stenosis with severe branch vessel disease of the first diagonal  2. Moderate left circumflex stenosis  3. Mild nonobstructive RCA stenosis  4. Normal aortic root  Recommendations: I'm not sure how to explain this patient's presentation of severe substernal chest pain with associated vomiting. He does not have an obvious coronary culprit vessel and there is no obvious proximal aortic pathology. He is medically very complex with brittle diabetes.  Echo: 01/21/2013 SUMMARY  Technically diffcult study. Only subcostal views obtainable  The left ventricular size is normal.  Left ventricular systolic function is normal.  The right ventricle is normal in size and function.  The left atrium is not well visualized.  Right atrium not well visualized.  There is mild tricuspid regurgitation.  Cardiac valves not well visualized but there appears to vbe no significant  stenosis seen  There is no pericardial effusion.  There is no comparison study available.  FINDINGS:  LEFT VENTRICLE  The left ventricular size is  normal. There is normal left ventricular wall  thickness. LV ejection fraction = 55-60%. Left ventricular systolic function  is normal. Left ventricular filling pattern is indeterminate.  RIGHT VENTRICLE  The right ventricle is normal in size and function.  LEFT ATRIUM  The left atrium is not well visualized.  RIGHT ATRIUM  Right atrium not well visualized.  AORTIC VALVE  The aortic valve is not well visualized. No aortic regurgitation is present.  MITRAL VALVE  The mitral valve is not well visualized. There is no mitral regurgitation  noted.  TRICUSPID VALVE  Structurally normal tricuspid valve. There is mild tricuspid regurgitation.  Estimated right atrial pressure is 5 mmHg.[name] There was insufficient TR  detected to calculate RV systolic pressure.  PULMONIC VALVE  The pulmonic valve is not well visualized. There is no pulmonic valvular  regurgitation.  ARTERIES  The aortic root is not well visualized.  VENOUS  Pulmonary veins were not well visualized during exam. IVC size was normal.  EFFUSION  There is no pericardial effusion.  MMode/2D Measurements & Calculations  IVSd: 1.1 cm  LVIDd: 4.3 cm  LVPWd: 0.94 cm  LVIDs: 2.4 cm  LA dim: 2.7 cm  Ao root: 3.1 cm  LVOT diam: 2.2 cm  Doppler Measurements & Calculations  MV E max vel: 70.5 cm/sec  MV A max vel: 75.9 cm/sec  MV E/A: 0.93  MV dec time: 0.27 sec  SV(LVOT): 71.8 ml  Ao max PG: 20.3 mmHg  Ao mean PG: 10.1 mmHg  Ao V2 VTI: 29.6 cm  AVA (VTI): 2.4 cm2  LV V1 VTI: 19.7 cm  TR max vel: 251.1 cm/sec  TR max PG: 25.2 mmHg  Reading Physician:  [place], [id number] 01/21/2013 11:33 AM   ECG: CHB, HR 36. Intermittent ?afib seen on telemetry  ASSESSMENT AND PLAN:  Bradycardia - complexes are wide, however potassium is significantly abnormal. Also has recent history of  rapid atrial fib. Not anticoagulated due to low platelets and fall risk. IM/Renal teams to see and admit. Cardiology will follow along, but PPM not  indicated at this time. Pt endorses bradycardia at HD, but this is not reflected in [facility] records (being faxed to cath lab). Continue to follow, if bradycardia not a problem once pt K+ improved, may resume beta blocker.   Chest pain - history of non-critical CAD by cath 2013, initial enzymes negative. Will continue to follow, EF normal in December. Check echo once potassium normalized. Doubt cath indicated unless EF decreased, WMA seen or significant enzyme elevation (minimal enzyme elevation in December at [facility]).     Jonetta Speak, PA-C 04/01/2013 4:02 PM Beeper [phone]  Cardiac issues will require resolution of metabolic abnormalities, but am exceedingly pleased that Tn is normal

## 2013-04-01 NOTE — ED Notes (Signed)
Does dialysis on Tues, Thurs, and Sat. Made it on Tuesday, but wasn't able to go d/t weather on Thursday. Was supposed to go today to make up for it but was feeling so bad

## 2013-04-01 NOTE — ED Provider Notes (Signed)
CSN: [id number]     Arrival date & time 04/01/13  1255 History   First MD Initiated Contact with Patient 04/01/13 1301     Chief Complaint  Patient presents with  . Weakness     (Consider location/radiation/quality/duration/timing/severity/associated sxs/prior Treatment) The history is provided by the patient.   Joe Vincent Is a 53 year old male who presents to the ED with chief complaint of weakness.  Per EMS patient coming in with bradycardia between 25 and 60 beats per minute.  Review of the patient's chart shows admission for new onset end-stage renal disease, hyperkalemia, septic shock from a community acquired pneumonia, A. fib with RVR.  Patient had transient thoracic echocardiogram showed LVEF of 55-60% with no wall motion abnormalities.  I was unable to obtain the patient EKG.  Patient was discharged with metoprolol and rate controlled without atrial fibrillation.  Patient states that he has had ongoing weakness since that time however over the past 3 weeks he has had severe weakness and states he is difficulty walking.  He has dialysis Monday Wednesday and Friday and states that because he becomes very hypotensive while being dialyzed they have been unable to fully dilate him.  Patient states that he became so weak today that he crawled to his neighbors house for help.  He is also complaining of severe central chest pain.  He denies any radiation to his extremities.  He does have known coronary artery disease and history of MI.  Patient states he is unable to take aspirin because of his renal disease.  Past Medical History  Diagnosis Date  . Diabetes mellitus   . Hypertension   . ESRD on hemodialysis     On HD in [place] at [place] unit.  Nephrologist is Dr. Hinda Lenis. Gets HD on TTS schedule.   [name] Retinopathy   . Peripheral neuropathy   . Chest pain     a. 05/2011 Cath: nonobs dzs.  . Myocardial infarction 2000  . Depression   . Liver disease   . Cancer     Colon  . GERD  (gastroesophageal reflux disease)   . CAD (coronary artery disease)   . OA (osteoarthritis)     Hx: of  . Anxiety    Past Surgical History  Procedure Laterality Date  . Colostomy  Ileostomy    colon cancer  . Amputation      left pinky toe  . Esophagogastroduodenoscopy  05/23/2011    Procedure: ESOPHAGOGASTRODUODENOSCOPY (EGD);  Surgeon: Beryle Beams, MD;  Location: [facility] ENDOSCOPY;  Service: Endoscopy;  Laterality: N/A;  . Colon surgery  2009  . Cardiac catheterization  2012  . Eye surgery    . Av fistula placement Right 03/02/2013    Procedure: INSERTION OF ARTERIOVENOUS (AV) GORE-TEX GRAFT ARM - RIGHT FOREARM VERSUS UPPER ARM;  Surgeon: Mal Misty, MD;  Location: [facility] OR;  Service: Vascular;  Laterality: Right;   Family History  Problem Relation Age of Onset  . Cancer Mother   . Heart disease Father   . Cancer Father    History  Substance Use Topics  . Smoking status: Current Every Day Smoker -- 0.25 packs/day for 30 years    Types: Cigarettes  . Smokeless tobacco: Former Systems developer  . Alcohol Use: No    Review of Systems  Constitutional: Positive for activity change, appetite change and fatigue. Negative for fever.  HENT: Negative for congestion.   Eyes: Negative for visual disturbance.  Respiratory: Positive for shortness of breath. Negative for cough,  chest tightness and wheezing.   Cardiovascular: Positive for chest pain and palpitations. Negative for leg swelling.  Gastrointestinal: Negative for nausea, vomiting and abdominal pain.  Genitourinary: Negative for dysuria.  Musculoskeletal: Negative for back pain and myalgias.  Skin: Negative for wound.  Neurological: Positive for weakness. Negative for syncope, facial asymmetry, speech difficulty, light-headedness, numbness and headaches.  Psychiatric/Behavioral: Negative for behavioral problems.  All other systems reviewed and are negative.      Allergies  Codeine  Home Medications   Current Outpatient Rx   Name  Route  Sig  Dispense  Refill  . ciprofloxacin (CIPRO) 500 MG tablet               . gabapentin (NEURONTIN) 400 MG capsule               . HYDROcodone-acetaminophen (NORCO/VICODIN) 5-325 MG per tablet               . ibuprofen (ADVIL,MOTRIN) 800 MG tablet               . insulin aspart (NOVOLOG) 100 UNIT/ML injection   Subcutaneous   Inject 4-12 Units into the skin 3 (three) times daily as needed for high blood sugar (Uses a sliding scale at home).         . insulin glargine (LANTUS) 100 UNIT/ML injection   Subcutaneous   Inject 15 Units into the skin at bedtime.          . metoprolol tartrate (LOPRESSOR) 25 MG tablet               . midodrine (PROAMATINE) 10 MG tablet               . morphine (KADIAN) 80 MG 24 hr capsule               . morphine (MS CONTIN) 60 MG 12 hr tablet               . multivitamin (RENA-VIT) TABS tablet   Oral   Take 1 tablet by mouth daily.         [name] NOVOLOG FLEXPEN 100 UNIT/ML FlexPen               . oxycodone (ROXICODONE) 30 MG immediate release tablet   Oral   Take 1 tablet (30 mg total) by mouth 3 (three) times daily.   20 tablet   0   . pantoprazole (PROTONIX) 40 MG tablet   Oral   Take 40 mg by mouth daily.         . penicillin v potassium (VEETID) 500 MG tablet               . SPIRIVA HANDIHALER 18 MCG inhalation capsule               . sucralfate (CARAFATE) 1 G tablet   Oral   Take 1 g by mouth 4 (four) times daily -  with meals and at bedtime.         . traZODone (DESYREL) 100 MG tablet   Oral   Take 1 tablet (100 mg total) by mouth at bedtime.   30 tablet   0   . traZODone (DESYREL) 50 MG tablet                BP 130/92  Temp(Src) 97.4 F (36.3 C)  Resp 13  SpO2 100% Physical Exam  Nursing note and vitals reviewed. Constitutional: He is oriented to person,  place, and time. He appears well-developed and well-nourished.  appears uncomfortable   HENT:   Head: Normocephalic and atraumatic.  Eyes: Conjunctivae and EOM are normal. Pupils are equal, round, and reactive to light. No scleral icterus.  Neck: Normal range of motion. Neck supple. No JVD present. No thyromegaly present.  Cardiovascular: Normal heart sounds and intact distal pulses.   Rate on screen waffles between 30 to high 60s. No P waves present. Occasional A-fib.   Pulmonary/Chest: Effort normal and breath sounds normal. No respiratory distress. He has no wheezes.  Catheter present in the Left upper chest wall.   Abdominal: Soft. Bowel sounds are normal. There is no tenderness.  Musculoskeletal: Normal range of motion. He exhibits no edema and no tenderness.  Neurological: He is alert and oriented to person, place, and time. He has normal reflexes.  Skin: Skin is warm and dry.  Psychiatric: He has a normal mood and affect. His behavior is normal. Thought content normal.    ED Course  Procedures (including critical care time) Labs Review Labs Reviewed  MAGNESIUM - Abnormal; Notable for the following:    Magnesium 1.4 (*)    All other components within normal limits  CBC WITH DIFFERENTIAL - Abnormal; Notable for the following:    Monocytes Absolute 1.2 (*)    All other components within normal limits  COMPREHENSIVE METABOLIC PANEL  Rosezena Sensor, ED   Imaging Review Dg Chest Port 1 View  04/01/2013   CLINICAL DATA:  Smoker.  Chest pain.  EXAM: PORTABLE CHEST - 1 VIEW  COMPARISON:  DG CHEST 2 VIEW dated 08/17/2012  FINDINGS: Large bore dual-lumen catheter noted with tip projected over right atrium. Lungs are clear. No pleural effusion or pneumothorax. Heart size normal. Pacing pads noted over the chest.  IMPRESSION: 1. No acute cardiopulmonary disease. 2. Dialysis catheter noted in good anatomic position.   Electronically Signed   By: Maisie Fus  Register   On: 04/01/2013 13:30    EKG Interpretation  None  Date: 04/01/2013  Rate: 36  Rhythm: junctional rhythm  QRS  Axis: normal  Intervals: normal  ST/T Wave abnormalities: normal  Conduction Disutrbances:nonspecific intraventricular conduction delay   Narrative Interpretation: Junctional bradycardia with widened QRS  Old EKG Reviewed: none available   CRITICAL CARE Performed by: Arthor Captain   Total critical care time: 40  Critical care time was exclusive of separately billable procedures and treating other patients.  Critical care was necessary to treat or prevent imminent or life-threatening deterioration.  Critical care was time spent personally by me on the following activities: development of treatment plan with patient and/or surrogate as well as nursing, discussions with consultants, evaluation of patient's response to treatment, examination of patient, obtaining history from patient or surrogate, ordering and performing treatments and interventions, ordering and review of laboratory studies, ordering and review of radiographic studies, pulse oximetry and re-evaluation of patient's condition.   MDM   Final diagnoses:  Acute hyperkalemia  Symptomatic bradycardia    Patient with symptomatic bradycardia, chest pain. Weakness. Labs pending.He is on monitoring with pacer pads in place.  I am concerned for electrolyte abnormality, possible MI. Patient stable at this time with close monitoring. I have consulted with cardiology who will assess the patient. (PA Barrett). We have reviwed EKG, discussed differential and reviewed meds.   4:13 PM Patient with acute, severe hyperkalemia.  Given calcium gluconate 1 g IVP, albuterol 2.5, insulin 5 units, bicarb 50 meQ, dextrose.  I have placed call to the nephrologist  for urgent dialysis and internal medicine for admission.  Filed Vitals:   04/01/13 1830 04/01/13 1900 04/01/13 1930 04/01/13 2000  BP: 121/102 103/73 93/63 87/60   Pulse: 114 117 113 108  Temp:      TempSrc:      Resp: 16 18 20 20   SpO2: 100% 100% 100% 100%   Patient will be  amitted by Dr. Tana Coast, Dr. Jonnie Finner and Justin Mend will take to emergent dialysis. On monitor, qrs complex is narrowing. The patient has also been seen By Dr. Zoe Lan in cardiology who agrees that EKG has improved. Patient seen in shared visit with Dr. Doy Mince. He is hemodynamically stablea at this time. Alert and oriented.     Margarita Mail, PA-C 04/03/13 2103

## 2013-04-01 NOTE — ED Notes (Signed)
Pt is dialysis patient, received dialysis on Wednesday, scheduled to go to dialysis today at noon. Did not feel well enough to go.

## 2013-04-01 NOTE — ED Notes (Signed)
Joe Vincent, [place] with cardiology at the bedside.

## 2013-04-01 NOTE — ED Notes (Signed)
Per EMS: Patient coming from home, c/o weakness for approx. 6 months. Pt reports weakness in extremities, vertigo, and a sensation of something sitting on his chest.  1 NTG administered in route. Pt HR between 25-60, currently 55 on monitor. Pt does take beta blockers. BP 128/64. Ax4. 20 GA L FA.

## 2013-04-02 ENCOUNTER — Inpatient Hospital Stay (HOSPITAL_COMMUNITY): Payer: Medicare Other

## 2013-04-02 DIAGNOSIS — E119 Type 2 diabetes mellitus without complications: Secondary | ICD-10-CM

## 2013-04-02 LAB — CBC
HEMATOCRIT: 35.3 % — AB (ref 39.0–52.0)
Hemoglobin: 11.5 g/dL — ABNORMAL LOW (ref 13.0–17.0)
MCH: 30.8 pg (ref 26.0–34.0)
MCHC: 32.6 g/dL (ref 30.0–36.0)
MCV: 94.6 fL (ref 78.0–100.0)
Platelets: 137 10*3/uL — ABNORMAL LOW (ref 150–400)
RBC: 3.73 MIL/uL — ABNORMAL LOW (ref 4.22–5.81)
RDW: 15.7 % — AB (ref 11.5–15.5)
WBC: 8.7 10*3/uL (ref 4.0–10.5)

## 2013-04-02 LAB — BASIC METABOLIC PANEL
BUN: 27 mg/dL — AB (ref 6–23)
CO2: 22 mEq/L (ref 19–32)
CREATININE: 4.59 mg/dL — AB (ref 0.50–1.35)
Calcium: 8.4 mg/dL (ref 8.4–10.5)
Chloride: 91 mEq/L — ABNORMAL LOW (ref 96–112)
GFR calc non Af Amer: 13 mL/min — ABNORMAL LOW (ref >90)
GFR, EST AFRICAN AMERICAN: 16 mL/min — AB (ref >90)
Glucose, Bld: 119 mg/dL — ABNORMAL HIGH (ref 70–99)
Potassium: 5 mEq/L (ref 3.7–5.3)
Sodium: 134 mEq/L — ABNORMAL LOW (ref 137–147)

## 2013-04-02 LAB — GLUCOSE, CAPILLARY
GLUCOSE-CAPILLARY: 121 mg/dL — AB (ref 70–99)
Glucose-Capillary: 105 mg/dL — ABNORMAL HIGH (ref 70–99)
Glucose-Capillary: 127 mg/dL — ABNORMAL HIGH (ref 70–99)
Glucose-Capillary: 100 mg/dL — ABNORMAL HIGH (ref 70–99)

## 2013-04-02 LAB — TSH: TSH: 1.023 u[IU]/mL (ref 0.350–4.500)

## 2013-04-02 LAB — HEMOGLOBIN A1C
Hgb A1c MFr Bld: 8.9 % — ABNORMAL HIGH (ref <5.7)
Mean Plasma Glucose: 209 mg/dL — ABNORMAL HIGH (ref <117)

## 2013-04-02 LAB — TROPONIN I
Troponin I: <0.3 ng/mL (ref <0.30)
Troponin I: <0.3 ng/mL (ref <0.30)

## 2013-04-02 LAB — MRSA PCR SCREENING: MRSA by PCR: NEGATIVE

## 2013-04-02 MED ORDER — SODIUM CHLORIDE 0.9 % IV SOLN: 100.0000 mL | INTRAVENOUS | Status: DC | PRN

## 2013-04-02 MED ORDER — HEPARIN SODIUM (PORCINE) 5000 UNIT/ML IJ SOLN
5000.0000 [IU] | Freq: Three times a day (TID) | INTRAMUSCULAR | Status: DC
  Administered 2013-04-02 – 2013-04-07 (×12): 5000 [IU] via SUBCUTANEOUS
  Filled 2013-04-02 (×18): qty 1

## 2013-04-02 MED ORDER — LIDOCAINE HCL (PF) 1 % IJ SOLN: 5.0000 mL | INTRAMUSCULAR | Status: DC | PRN

## 2013-04-02 MED ORDER — IOHEXOL 350 MG/ML SOLN
80.0000 mL | Freq: Once | INTRAVENOUS | Status: AC | PRN
  Administered 2013-04-02: 70 mL via INTRAVENOUS

## 2013-04-02 MED ORDER — PENTAFLUOROPROP-TETRAFLUOROETH EX AERO: 1.0000 "application " | INHALATION_SPRAY | CUTANEOUS | Status: DC | PRN

## 2013-04-02 MED ORDER — HEPARIN SODIUM (PORCINE) 1000 UNIT/ML DIALYSIS
1000.0000 [IU] | INTRAMUSCULAR | Status: DC | PRN
  Filled 2013-04-02: qty 1

## 2013-04-02 MED ORDER — NEPRO/CARBSTEADY PO LIQD
237.0000 mL | Freq: Three times a day (TID) | ORAL | Status: DC
  Administered 2013-04-03 – 2013-04-07 (×11): 237 mL via ORAL

## 2013-04-02 MED ORDER — NEPRO/CARBSTEADY PO LIQD: 237.0000 mL | ORAL | Status: DC | PRN

## 2013-04-02 MED ORDER — IPRATROPIUM-ALBUTEROL 0.5-2.5 (3) MG/3ML IN SOLN
3.0000 mL | Freq: Three times a day (TID) | RESPIRATORY_TRACT | Status: DC
Start: 2013-04-02 — End: 2013-04-04
  Administered 2013-04-02 – 2013-04-04 (×7): 3 mL via RESPIRATORY_TRACT
  Filled 2013-04-02 (×7): qty 3

## 2013-04-02 MED ORDER — LIDOCAINE-PRILOCAINE 2.5-2.5 % EX CREA
1.0000 "application " | TOPICAL_CREAM | CUTANEOUS | Status: DC | PRN
  Filled 2013-04-02: qty 5

## 2013-04-02 MED ORDER — ALTEPLASE 2 MG IJ SOLR
2.0000 mg | Freq: Once | INTRAMUSCULAR | Status: AC | PRN
  Filled 2013-04-02: qty 2

## 2013-04-02 NOTE — Progress Notes (Signed)
New Admission Note: Transfer patient from [place] to HD to [facility] Method: Via bed with Hemo RN Merry Proud  Mental Orientation: Alert and Oriented X4 Telemetry: Placed on box 6E04 running NSR  Assessment: Completed Skin: Warm, dry and intact. Flaky from scratching everywhere. Bilateral arms and legs exocriated areasfrom scratching and buttocks is reddened as well. 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 Normal saline locked Pain: States he is an 8 out 10. Mostly neuropathic pain in lower legs. Medication was given  Tubes: Right sided ileostomy bag. Patient changes, cleans and empties himself  Safety Measures: Safety Fall Prevention Plan has been given, discussed and signed Admission: Completed by [place] already  [place] Orientation: Patient has been orientated to the room, unit and staff.  Family: No family at the bedside   Orders have been reviewed and implemented. Will continue to monitor the patient. Call light has been placed within reach and bed alarm has been activated.   Patient was transferred right at shift change and was accepted by day shift RN Wendi. NT from [place] brought the patient belongings to his bedside. He had a large amount of medication with him. I counted all his medications with Cathie Wicker and Fredrich Romans and took them to pharmacy for storage. Patient is aware of where is medications are. He has his own CBG meter at bedside and has been instructed to not use his and allow [place] to take his CBG.   [employer] BSN, RN  Phone number: [phone]

## 2013-04-02 NOTE — Progress Notes (Addendum)
Nurse gave report to [facility] on 6700.  Nurse contacted HD to inform of pt new room assignment, belongings were carried to new room on [place].   Nurse attempted to call report to [place] at [place], nurse was unavailable.  2C nurse left information with Network engineer.

## 2013-04-02 NOTE — Progress Notes (Addendum)
Patient ID: Joe Vincent, male   DOB: 22-Feb-1960, 53 y.o.   MRN: [id number]     Subjective:   Mild noncardiac chest pain this AM, worst with movement and deep breath   Objective:   Temp:  [96.9 F (36.1 C)-98.3 F (36.8 C)] 98.1 F (36.7 C) (02/28 0800) Pulse Rate:  [68-117] 77 (02/28 0829) Resp:  [11-20] 11 (02/28 0829) BP: (82-147)/(51-102) 121/66 mmHg (02/28 0829) SpO2:  [96 %-100 %] 100 % (02/28 0951) Weight:  [140 lb 14 oz (63.9 kg)] 140 lb 14 oz (63.9 kg) (02/27 2245) Last BM Date: 04/01/13  Filed Weights   04/01/13 2245  Weight: 140 lb 14 oz (63.9 kg)    Intake/Output Summary (Last 24 hours) at 04/02/13 1036 Last data filed at 04/02/13 0600  Gross per 24 hour  Intake    243 ml  Output   2607 ml  Net  -2364 ml    Telemetry: NSR rates 70s  Exam:  General:NAD  Resp: faint expirtory wheezing  Cardiac: RRR, no m/r/g,no JVD  JJ:HERDEYC soft, NT, ND  MSK: extremities are warm, no edema  Neuro: no focal deficits   Lab Results:  Basic Metabolic Panel:  Recent Labs Lab 04/01/13 1316  04/01/13 1433 04/01/13 1719 04/01/13 1830 04/01/13 2250 04/02/13 0300  NA  --   --  132*  --   --   --  134*  K  --   < > >7.7* >7.7* 4.2  --  5.0  CL  --   --  97  --   --   --  91*  CO2  --   --  13*  --   --   --  22  GLUCOSE  --   --  225*  --   --   --  119*  BUN  --   --  87*  --   --   --  27*  CREATININE  --   --  9.16*  --   --  4.09* 4.59*  CALCIUM  --   --  8.6  --   --   --  8.4  MG 1.4*  --   --   --   --   --   --   < > = values in this interval not displayed.  Liver Function Tests:  Recent Labs Lab 04/01/13 1433  AST 28  ALT 34  ALKPHOS 201*  BILITOT <0.2*  PROT 7.8  ALBUMIN 3.3*    CBC:  Recent Labs Lab 04/01/13 1433 04/01/13 2250 04/02/13 0300  WBC 10.1 9.4 8.7  HGB 13.5 12.5* 11.5*  HCT 40.7 36.8* 35.3*  MCV 94.2 92.5 94.6  PLT 157 138* 137*    Cardiac Enzymes:  Recent Labs Lab 04/01/13 2223 04/01/13 2250 04/02/13 0300    TROPONINI <0.30 <0.30 <0.30    BNP:  Recent Labs  08/17/12 0746  PROBNP 3324.0*    Coagulation: No results found for this basename: INR,  in the last 168 hours  ECG:   Medications:   Scheduled Medications: . calcium gluconate  1 g Intravenous STAT  . folic acid  1 mg Oral Daily  . gabapentin  400 mg Oral TID  . insulin aspart  0-5 Units Subcutaneous QHS  . insulin aspart  0-9 Units Subcutaneous TID WC  . insulin aspart  5 Units Intravenous STAT  . insulin glargine  10 Units Subcutaneous QHS  . ipratropium-albuterol  3 mL Nebulization TID  . multivitamin  1 tablet Oral QHS  . naproxen  500 mg Oral BID WC  . pantoprazole  40 mg Oral Daily  . sodium chloride  3 mL Intravenous Q12H     Infusions:     PRN Medications:  acetaminophen, acetaminophen, alum & mag hydroxide-simeth, guaiFENesin-dextromethorphan, HYDROcodone-acetaminophen, HYDROmorphone (DILAUDID) injection, morphine, ondansetron (ZOFRAN) IV, ondansetron, oxycodone     Assessment/Plan    53 yo male hx of non-critical CAD, ESRD, admitted with bradycardia and chest pain after missing hemodilaysis.   1. Atypical chest pain - desribes sharp diffuse pain throughout chest that can last all day long, worst with deep breaths - pain worst with deep inspiration, cough. CT PE negative.  - cath 05/2011 with non-obsructive CAD, trop negative x 3.  - echo pending, if normal echo no further cardiac workup planned   2. Bradycardia - on home beta blocker, presented with severe hyperkalemia.  - seen by EP, do not feel PPM is indicated at this time.  - tele shows normal sinus rhythm overnight rates 70s. Will continue to hold beta blocker for now, consider starting lower dose if rates remain stable. Would restart lopressor 12.5mg  bid.   3. Spiculated lung lesions - noted on chest CT, workup per primary team.   4. Afib - not on anticoag due to history of low platelets, fall risk per notes.    Carlyle Dolly, M.D.,  F.A.C.C.

## 2013-04-02 NOTE — Progress Notes (Signed)
Pt was transferred to HD by HD nurse, pt was alert and oriented prior to transport.

## 2013-04-02 NOTE — Progress Notes (Signed)
[facility] Progress Note [place] TEAM 1 - Stepdown/ICU TEAM   [place] GUR:[id number] DOB: 1960-07-30 DOA: 04/01/2013 PCP: Curlene Labrum, MD  Brief narrative: Joe Vincent is a 53 y.o. male presenting on 04/01/2013 with history of diabetes, Hep C, ESRD on hemodialysis, COPD history of colon cancer status post ileostomy and lung cancer presented to the ER for chest pain, shortness of breath and chills.  Admitted to [facility] in 12/14 with septic shock and Acute resp failure with e coli PNA. Had an episode of A-fib with RVR (CHADS VASc2 was 1) Patient reported that he was scheduled for hemodialysis TTS in [place], however he missed the hemodialysis on Thursday due to snow. Patient was rescheduled to have hemodialysis on 2/27 - he woke up with 10/10 chest pain with shortness of breath nausea and dizziness and did not feel that he could make it to the hemodialysis center. Patient's friend called EMS and patient was brought to the ER.  Patient complaint of having generalized weakness for about 6 months and chest pain for at least a month however worse on the day of admission with sensation of "something sitting on my chest". He was given one nitroglycerin in route and was noted to have severe bradycardia with his heart rate in 20s and in junctional rhythm. He is on beta blocker as outpatient.  Labs in the ER showed a potassium >7.7, BUN 87, creatinine 9.16 magnesium 1.4. EKG showed heart rate of 36.  He was treated with calcium, insulin, bicarbonate, albuterol, D50 with improvement in his cardiac rhythm in the ER. Nephrology was consulted for emergent hemodialysis cardiology was also consulted by EDP..   Subjective: Overall feeling better. Continues to have chest pain across his chest despite being dialyzed.  C/o severe weakness in legs but this has been going on for a few months and his PCP has evaluated it.   Assessment/Plan: Principal Problem:   Severe bradycardia- junctional rhythm- h/o  Atrial fibrillation -symptomatic in the setting of severe hyperkalemia, beta blocker, recent history of rapid atrial fibrillation - corrected with emergent hemodialysis  Active Problems:   Chest pressure - CTA negative- likely was from pulm edema in setting of missed dialysis   Hyperkalemia - resolved  Spiculated mass - h/o lung CA in 2003 treated with chemo and radiation - left lung apex - stable - right lung base- enlarging - nodule in middle lobe- enlarging -d/w pulm- Dr [name] states he will be seen on Monday or as outpt if he goes home prior to Monday  Thrombocytopenia - follow  Hep C    Diabetes mellitus - stable on current meds    Hypertension - BP low- follow - hold antihypertensives    ESRD on hemodialysis - per nephrology     Code Status: DNR Family Communication: none Disposition Plan: Pt eval- possible SNF  Consultants: nephrology  Procedures: none  Antibiotics: Antibiotics Given (last 72 hours)   None       DVT prophylaxis: Heparin  Objective: Filed Weights   04/01/13 2245  Weight: 63.9 kg (140 lb 14 oz)   Blood pressure 87/55, pulse 73, temperature 98.3 F (36.8 C), temperature source Axillary, resp. rate 12, height 6' (1.829 m), weight 63.9 kg (140 lb 14 oz), SpO2 100.00%.  Intake/Output Summary (Last 24 hours) at 04/02/13 0809 Last data filed at 04/02/13 0600  Gross per 24 hour  Intake    243 ml  Output   2607 ml  Net  -2364 ml  Exam: General: AAO x 3, No acute respiratory distress Lungs: Clear to auscultation bilaterally without wheezes or crackles Cardiovascular: Regular rate and rhythm without murmur gallop or rub normal S1 and S2 Abdomen: Nontender, nondistended, soft, bowel sounds positive, no rebound, no ascites, no appreciable mass Extremities: No significant cyanosis, clubbing, or edema bilateral lower extremities  Data Reviewed: Basic Metabolic Panel:  Recent Labs Lab 04/01/13 1316 04/01/13 1433  04/01/13 1719 04/01/13 1830 04/01/13 2250 04/02/13 0300  NA  --  132*  --   --   --  134*  K  --  >7.7* >7.7* 4.2  --  5.0  CL  --  97  --   --   --  91*  CO2  --  13*  --   --   --  22  GLUCOSE  --  225*  --   --   --  119*  BUN  --  87*  --   --   --  27*  CREATININE  --  9.16*  --   --  4.09* 4.59*  CALCIUM  --  8.6  --   --   --  8.4  MG 1.4*  --   --   --   --   --    Liver Function Tests:  Recent Labs Lab 04/01/13 1433  AST 28  ALT 34  ALKPHOS 201*  BILITOT <0.2*  PROT 7.8  ALBUMIN 3.3*   No results found for this basename: LIPASE, AMYLASE,  in the last 168 hours No results found for this basename: AMMONIA,  in the last 168 hours CBC:  Recent Labs Lab 04/01/13 1433 04/01/13 2250 04/02/13 0300  WBC 10.1 9.4 8.7  NEUTROABS 7.6  --   --   HGB 13.5 12.5* 11.5*  HCT 40.7 36.8* 35.3*  MCV 94.2 92.5 94.6  PLT 157 138* 137*   Cardiac Enzymes:  Recent Labs Lab 04/01/13 1635 04/01/13 2223 04/01/13 2250 04/02/13 0300  TROPONINI <0.30 <0.30 <0.30 <0.30   BNP (last 3 results)  Recent Labs  08/17/12 0746  PROBNP 3324.0*   CBG:  Recent Labs Lab 04/01/13 1615 04/01/13 2228  GLUCAP 166* 231*    Recent Results (from the past 240 hour(s))  MRSA PCR SCREENING     Status: None   Collection Time    04/01/13 10:23 PM      Result Value Ref Range Status   MRSA by PCR NEGATIVE  NEGATIVE Final   Comment:            The GeneXpert MRSA Assay (FDA     approved for NASAL specimens     only), is one component of a     comprehensive MRSA colonization     surveillance program. It is not     intended to diagnose MRSA     infection nor to guide or     monitor treatment for     MRSA infections.     Studies:  Recent x-ray studies have been reviewed in detail by the Attending Physician  Scheduled Meds:  Scheduled Meds: . calcium gluconate  1 g Intravenous STAT  . folic acid  1 mg Oral Daily  . gabapentin  400 mg Oral TID  . insulin aspart  0-5 Units  Subcutaneous QHS  . insulin aspart  0-9 Units Subcutaneous TID WC  . insulin aspart  5 Units Intravenous STAT  . insulin glargine  10 Units Subcutaneous QHS  . ipratropium-albuterol  3 mL Nebulization  TID  . multivitamin  1 tablet Oral QHS  . naproxen  500 mg Oral BID WC  . pantoprazole  40 mg Oral Daily  . sodium chloride  3 mL Intravenous Q12H   Continuous Infusions:   Time spent on care of this patient: >35 min   Debbe Odea, MD  [facility] Office  [phone] Pager - Text Page per Shea Evans as per below:  On-Call/Text Page:      [email or web address]  If 7PM-7AM, please contact night-coverage [email or web address] 04/02/2013, 8:09 AM   LOS: 1 day

## 2013-04-02 NOTE — Progress Notes (Signed)
INITIAL NUTRITION ASSESSMENT  DOCUMENTATION CODES Per approved criteria  -Severe malnutrition in the context of chronic illness   INTERVENTION: Nepro Shake po TID, each supplement provides 425 kcal and 19 grams protein  NUTRITION DIAGNOSIS: Malnutrition related to chronic disease as evidenced by severe fat and muscle wasting.   Goal: Pt to meet >/= 90% of their estimated nutrition needs   Monitor:  PO intake, supplement acceptance, weight trend, labs  Reason for Assessment: MD Consult   53 y.o. male  Admitting Dx: Severe sinus bradycardia  ASSESSMENT: Patient is a 53 year old male with history of diabetes, hypertension, ESRD on hemodialysis, TTS, history of colon cancer status post ileostomy presented to the ER for chest pain, shortness of breath and chills.  Patient reported that he was scheduled for hemodialysis TTS in [place], however he missed the hemodialysis on Thursday due to snow Diabetes with diabetic nephropathy . Lives alone very little support. Children in [place] Does not appear to keep in touch. Divorced from wife. Originally from [place].   Pt had emergent dialysis due to potassium level of 7.7. Pt started HD Dec 2014, per MD notes pt has been non-compliant with HD PTA. Per pt he lives alone with a shared kitchen. Pt goes out for Breakfast and eats grits, eggs, sausage, and a biscuit. The rest of the day he nibbles on fruit. He does not cook, will warm up a TV dinner a few times a week. Pt reports decreased appetite and motivation to cook for one.  Pt reports being on HD for 1.5 years, pt seemed to know foods to avoid. Pt able to verbalize foods high in potassium and phosphorus.   Nutrition Focused Physical Exam:  Subcutaneous Fat:  Orbital Region: severe wasting Upper Arm Region: severe wasting Thoracic and Lumbar Region: severe wasting  Muscle:  Temple Region: severe wasting Clavicle Bone Region: severe wasting Clavicle and Acromion Bone Region:  severe wasting Scapular Bone Region: severe wasting Dorsal Hand: severe wasting Patellar Region: severe wasting Anterior Thigh Region: severe wasting Posterior Calf Region: severe wasting  Edema: present   Height: Ht Readings from Last 1 Encounters:  04/01/13 6' (1.829 m)    Weight: Wt Readings from Last 1 Encounters:  04/01/13 140 lb 14 oz (63.9 kg)    Ideal Body Weight: 80.9 kg   % Ideal Body Weight: 79%  Wt Readings from Last 10 Encounters:  04/01/13 140 lb 14 oz (63.9 kg)  03/18/13 148 lb 1.6 oz (67.178 kg)  03/02/13 133 lb (60.328 kg)  03/02/13 133 lb (60.328 kg)  02/28/13 131 lb 3.2 oz (59.512 kg)  09/11/12 135 lb 5.8 oz (61.4 kg)  09/11/12 142 lb 6.7 oz (64.6 kg)  09/06/12 140 lb 6.9 oz (63.7 kg)  08/28/11 175 lb (79.379 kg)  05/26/11 167 lb (75.751 kg)    Usual Body Weight: 160 lb 1 year ago per pt  % Usual Body Weight: 88%  BMI:  Body mass index is 19.1 kg/(m^2).  Estimated Nutritional Needs: Kcal: [id number] Protein: 80-95 grams Fluid: 1.2 L/day  Skin: no open areas noted  Diet Order: Renal/CHO Modified  EDUCATION NEEDS: -No education needs identified at this time   Intake/Output Summary (Last 24 hours) at 04/02/13 1044 Last data filed at 04/02/13 0600  Gross per 24 hour  Intake    243 ml  Output   2607 ml  Net  -2364 ml    Last BM: ileostomy  175 ml 2/27  Labs:   Recent Labs Lab 04/01/13 1316  04/01/13 1433 04/01/13 1719 04/01/13 1830 04/01/13 2250 04/02/13 0300  NA  --   --  132*  --   --   --  134*  K  --   < > >7.7* >7.7* 4.2  --  5.0  CL  --   --  97  --   --   --  91*  CO2  --   --  13*  --   --   --  22  BUN  --   --  87*  --   --   --  27*  CREATININE  --   --  9.16*  --   --  4.09* 4.59*  CALCIUM  --   --  8.6  --   --   --  8.4  MG 1.4*  --   --   --   --   --   --   GLUCOSE  --   --  225*  --   --   --  119*  < > = values in this interval not displayed.  CBG (last 3)   Recent Labs  04/01/13 1615  04/01/13 2228 04/02/13 0858  GLUCAP 166* 231* 105*   Lab Results  Component Value Date   HGBA1C 7.3* 08/17/2012    Scheduled Meds: . calcium gluconate  1 g Intravenous STAT  . folic acid  1 mg Oral Daily  . gabapentin  400 mg Oral TID  . insulin aspart  0-5 Units Subcutaneous QHS  . insulin aspart  0-9 Units Subcutaneous TID WC  . insulin aspart  5 Units Intravenous STAT  . insulin glargine  10 Units Subcutaneous QHS  . ipratropium-albuterol  3 mL Nebulization TID  . multivitamin  1 tablet Oral QHS  . naproxen  500 mg Oral BID WC  . pantoprazole  40 mg Oral Daily  . sodium chloride  3 mL Intravenous Q12H    Continuous Infusions:   Past Medical History  Diagnosis Date  . Diabetes mellitus   . Hypertension   . ESRD on hemodialysis     On HD in [place] at [facility] unit.  Nephrologist is Dr. Hinda Lenis. Gets HD on TTS schedule.   [name] Retinopathy   . Peripheral neuropathy   . Chest pain     a. 05/2011 Cath: nonobs dzs.  . Myocardial infarction 2000  . Depression   . Liver disease   . Cancer     Colon  . GERD (gastroesophageal reflux disease)   . CAD (coronary artery disease)   . OA (osteoarthritis)     Hx: of  . Anxiety   . Esophagitis 2013    and gastritis by EGD    Past Surgical History  Procedure Laterality Date  . Colostomy  Ileostomy    colon cancer  . Amputation      left pinky toe  . Esophagogastroduodenoscopy  05/23/2011    Procedure: ESOPHAGOGASTRODUODENOSCOPY (EGD);  Surgeon: Beryle Beams, MD;  Location: [facility] ENDOSCOPY;  Service: Endoscopy;  Laterality: N/A;  . Colon surgery  2009  . Cardiac catheterization  05/22/2011    [place] OK, LAD OK, D1 80-90% at bifurcation, OM1 40-50%, OM2 50%, RCA irregular. Ao root no dilatation or dissection. Recommendations: I'm not sure how to explain this patient's presentation of severe substernal chest pain with associated vomiting. He does not have an obvious coronary culprit vessel and there is no obvious proximal aortic  pathology. He is medically very complex  . Eye surgery    .  Av fistula placement Right 03/02/2013    Procedure: INSERTION OF ARTERIOVENOUS (AV) GORE-TEX GRAFT ARM - RIGHT FOREARM VERSUS UPPER ARM;  Surgeon: Mal Misty, MD;  Location: [place];  Service: Vascular;  Laterality: Right;    Maylon Peppers [place], [place], [place] Pager [phone] After Hours Pager

## 2013-04-03 DIAGNOSIS — R079 Chest pain, unspecified: Secondary | ICD-10-CM

## 2013-04-03 DIAGNOSIS — I498 Other specified cardiac arrhythmias: Secondary | ICD-10-CM

## 2013-04-03 DIAGNOSIS — E875 Hyperkalemia: Secondary | ICD-10-CM

## 2013-04-03 DIAGNOSIS — N186 End stage renal disease: Secondary | ICD-10-CM

## 2013-04-03 DIAGNOSIS — R918 Other nonspecific abnormal finding of lung field: Secondary | ICD-10-CM

## 2013-04-03 DIAGNOSIS — R072 Precordial pain: Secondary | ICD-10-CM

## 2013-04-03 DIAGNOSIS — I4891 Unspecified atrial fibrillation: Secondary | ICD-10-CM

## 2013-04-03 DIAGNOSIS — E43 Unspecified severe protein-calorie malnutrition: Secondary | ICD-10-CM | POA: Insufficient documentation

## 2013-04-03 LAB — GLUCOSE, CAPILLARY
GLUCOSE-CAPILLARY: 126 mg/dL — AB (ref 70–99)
GLUCOSE-CAPILLARY: 196 mg/dL — AB (ref 70–99)
Glucose-Capillary: 108 mg/dL — ABNORMAL HIGH (ref 70–99)
Glucose-Capillary: 234 mg/dL — ABNORMAL HIGH (ref 70–99)

## 2013-04-03 MED ORDER — GABAPENTIN 400 MG PO CAPS
400.0000 mg | ORAL_CAPSULE | Freq: Two times a day (BID) | ORAL | Status: DC
  Administered 2013-04-03 – 2013-04-07 (×7): 400 mg via ORAL
  Filled 2013-04-03 (×9): qty 1

## 2013-04-03 NOTE — Progress Notes (Signed)
  Echocardiogram 2D Echocardiogram has been performed.  Joe Vincent 04/03/2013, 5:33 PM

## 2013-04-03 NOTE — Progress Notes (Signed)
[facility] ROUNDING NOTE   Subjective:   Interval History: received dialysis yesterday  Complaints of neuropathy  Objective:  Vital signs in last 24 hours:  Temp:  [97.7 F (36.5 C)-98.9 F (37.2 C)] 98 F (36.7 C) (03/01 0937) Pulse Rate:  [64-95] 82 (03/01 0937) Resp:  [10-20] 18 (03/01 0937) BP: (91-122)/(50-76) 101/63 mmHg (03/01 0937) SpO2:  [98 %-100 %] 100 % (03/01 0937) Weight:  [65 kg (143 lb 4.8 oz)-65.1 kg (143 lb 8.3 oz)] 65.1 kg (143 lb 8.3 oz) (02/28 1830)  Weight change: 1.1 kg (2 lb 6.8 oz) Filed Weights   04/01/13 2245 04/02/13 1503 04/02/13 1830  Weight: 63.9 kg (140 lb 14 oz) 65 kg (143 lb 4.8 oz) 65.1 kg (143 lb 8.3 oz)    Intake/Output: I/O last 3 completed shifts: In: [id number] [P.O.:1440; I.V.:9] Out: 2507 [Other:2332; Stool:175]   Intake/Output this shift:  Total I/O In: 240 [P.O.:240] Out: -   CVS- RRR RS- CTA ABD- BS present soft non-distended EXT- no edema   Basic Metabolic Panel:  Recent Labs Lab 04/01/13 1316 04/01/13 1433 04/01/13 1719 04/01/13 1830 04/01/13 2250 04/02/13 0300  NA  --  132*  --   --   --  134*  K  --  >7.7* >7.7* 4.2  --  5.0  CL  --  97  --   --   --  91*  CO2  --  13*  --   --   --  22  GLUCOSE  --  225*  --   --   --  119*  BUN  --  87*  --   --   --  27*  CREATININE  --  9.16*  --   --  4.09* 4.59*  CALCIUM  --  8.6  --   --   --  8.4  MG 1.4*  --   --   --   --   --     Liver Function Tests:  Recent Labs Lab 04/01/13 1433  AST 28  ALT 34  ALKPHOS 201*  BILITOT <0.2*  PROT 7.8  ALBUMIN 3.3*   No results found for this basename: LIPASE, AMYLASE,  in the last 168 hours No results found for this basename: AMMONIA,  in the last 168 hours  CBC:  Recent Labs Lab 04/01/13 1433 04/01/13 2250 04/02/13 0300  WBC 10.1 9.4 8.7  NEUTROABS 7.6  --   --   HGB 13.5 12.5* 11.5*  HCT 40.7 36.8* 35.3*  MCV 94.2 92.5 94.6  PLT 157 138* 137*    Cardiac Enzymes:  Recent Labs Lab  04/01/13 1635 04/01/13 2223 04/01/13 2250 04/02/13 0300 04/02/13 1205  TROPONINI <0.30 <0.30 <0.30 <0.30 <0.30    BNP: No components found with this basename: POCBNP,   CBG:  Recent Labs Lab 04/02/13 0858 04/02/13 1305 04/02/13 1910 04/02/13 2029 04/03/13 0817  GLUCAP 105* 127* 100* 121* 126*    Microbiology: Results for orders placed during the hospital encounter of 04/01/13  MRSA PCR SCREENING     Status: None   Collection Time    04/01/13 10:23 PM      Result Value Ref Range Status   MRSA by PCR NEGATIVE  NEGATIVE Final   Comment:            The GeneXpert MRSA Assay (FDA     approved for NASAL specimens     only), is one component of a     comprehensive MRSA  colonization     surveillance program. It is not     intended to diagnose MRSA     infection nor to guide or     monitor treatment for     MRSA infections.    Coagulation Studies: No results found for this basename: LABPROT, INR,  in the last 72 hours  Urinalysis: No results found for this basename: COLORURINE, APPERANCEUR, LABSPEC, PHURINE, GLUCOSEU, HGBUR, BILIRUBINUR, KETONESUR, PROTEINUR, UROBILINOGEN, NITRITE, LEUKOCYTESUR,  in the last 72 hours    Imaging: Ct Angio Chest Pe W/cm &/or Wo Cm  04/02/2013   CLINICAL DATA:  Atypical chest pain, for 1 month and worse today with pleuritic pain  EXAM: CT ANGIOGRAPHY CHEST WITH CONTRAST  TECHNIQUE: Multidetector CT imaging of the chest was performed using the standard protocol during bolus administration of intravenous contrast. Multiplanar CT image reconstructions and MIPs were obtained to evaluate the vascular anatomy.  CONTRAST:  48mL OMNIPAQUE IOHEXOL 350 MG/ML SOLN  COMPARISON:  DG CHEST 1V PORT dated 04/01/2013; CT ANGIO CHEST dated 07/03/2012  FINDINGS: Thoracic aorta is not dilated. There are no filling defects in the pulmonary arterial system. There is no pericardial or pleural effusion. There is coronary arterial calcification. There are numerous small  calcified lymph nodes within the mediastinum suggesting prior granulomatous exposure. There is an 8 mm calcified left upper lobe lung nodule consistent with a granuloma. Scans through the upper abdomen are unremarkable. There are no acute musculoskeletal findings.  Spiculated scarring in the left lung apex is stable. There is spiculated nodular density based against the posterior pleural surface in the right lung base. Previously it measured 11 x 14 mm, and currently it measures 13 x 19 mm. It was previously shown to be non tracer avid on PET scan. There is a bilobed nodular opacity in the anterior inferior right middle lobe. Components of this measure 17 x 9 and 13 x 14 mm as compared to 7 x 10 and 7 x 12 mm previously.  Review of the MIP images confirms the above findings.  IMPRESSION: 1. No evidence of pulmonary embolism 2. Although previously non FDG avid, there are progressively enlarging spiculated masslike lesions in the right lung as described above. These may represent progressive fibrotic change related to prior granulomatous exposure but neoplasm is not excluded. Percutaneous biopsy or PET-CT scan suggested. These results will be called to the ordering clinician or representative by the Radiologist Assistant, and communication documented in the PACS Dashboard.   Electronically Signed   By: Skipper Cliche M.D.   On: 04/02/2013 09:18   Dg Chest Port 1 View  04/01/2013   CLINICAL DATA:  Smoker.  Chest pain.  EXAM: PORTABLE CHEST - 1 VIEW  COMPARISON:  DG CHEST 2 VIEW dated 08/17/2012  FINDINGS: Large bore dual-lumen catheter noted with tip projected over right atrium. Lungs are clear. No pleural effusion or pneumothorax. Heart size normal. Pacing pads noted over the chest.  IMPRESSION: 1. No acute cardiopulmonary disease. 2. Dialysis catheter noted in good anatomic position.   Electronically Signed   By: Marcello Moores  Register   On: 04/01/2013 13:30     Medications:     . feeding supplement (NEPRO CARB  STEADY)  237 mL Oral TID WC  . folic acid  1 mg Oral Daily  . gabapentin  400 mg Oral TID  . heparin subcutaneous  5,000 Units Subcutaneous 3 times per day  . insulin aspart  0-5 Units Subcutaneous QHS  . insulin aspart  0-9 Units  Subcutaneous TID WC  . insulin glargine  10 Units Subcutaneous QHS  . ipratropium-albuterol  3 mL Nebulization TID  . multivitamin  1 tablet Oral QHS  . naproxen  500 mg Oral BID WC  . pantoprazole  40 mg Oral Daily  . sodium chloride  3 mL Intravenous Q12H   sodium chloride, sodium chloride, acetaminophen, acetaminophen, alum & mag hydroxide-simeth, feeding supplement (NEPRO CARB STEADY), guaiFENesin-dextromethorphan, heparin, HYDROcodone-acetaminophen, HYDROmorphone (DILAUDID) injection, lidocaine (PF), lidocaine-prilocaine, morphine, ondansetron (ZOFRAN) IV, ondansetron, oxycodone, pentafluoroprop-tetrafluoroeth  Assessment/ Plan:  53 year old male with history of diabetes, hypertension, ESRD on hemodialysis, TTS, history of colon cancer status post ileostomy presented to the ER for chest pain, shortness of breath and chills. History was obtained from the patient during his emergent hemodialysis. Potassium > 7.7   1. ESRD Dialysis TTS [place]  missed treatment Thursday presents for urgent dialysis for hyperkalemia  2. HTN Controlled  3. Anemia stable  4. Bone Mineral will evaluate calcium and phosphorus  5. Diabetes  6. Hyperkalemia resolved TTS 7. History of colon Cancer      LOS: 2 Sarita Hakanson W @TODAY @10 :13 AM

## 2013-04-03 NOTE — Consult Note (Signed)
Joe Vincent, Joe Vincent                 ACCOUNT NO.:  [id number]  MEDICAL RECORD NO.:  [id number]  LOCATION:  [id number]                        FACILITY:  [facility]  PHYSICIAN:  Kathee Delton, MD,FCCPDATE OF BIRTH:  1960[birth year]  DATE OF CONSULTATION:  04/03/2013 DATE OF DISCHARGE:                                CONSULTATION   REFERRING PHYSICIAN:  [facility] hospitalist.  HISTORY OF PRESENT ILLNESS:  The patient is a 53 year old male who I have been asked to see for an abnormal CT chest.  The patient was recently admitted with atypical chest pain and severe bradycardia, and a CT chest to rule out PE, also showed 2 nodules that have increased in size from a few years ago.  One nodule is in the right lower lobe, and the other is in the anterior right middle lobe near the diaphragm.  The patient did have a PET scan 2 years ago which was negative for uptake in the nodule at the right lung base.  There was no mention of the abnormality in the right middle lobe.  The patient has a long history of tobacco abuse, and continues to smoke a half a pack per day currently. He has had some weight loss, but denies any anorexia.  The patient was told that he had "lung cancer" in 2003, and states that he had spots that were too small to biopsy at that time.  He was never given diagnosis for this, and did not receive any treatment.  He does have a history of colon cancer for which he has received chemotherapy after surgery.  The patient denies any history of TB exposure or known tuberculosis infection.  PAST MEDICAL HISTORY:  Extensive and significant for, 1. Diabetes. 2. Hypertension. 3. End-stage renal disease for which he is on hemodialysis. 4. Peripheral neuropathy. 5. History of MYOCARDIAL INFARCTION. 6. History of colon cancer. 7. History of coronary disease.  ALLERGIES:  THE PATIENT IS ALLERGIC TO CODEINE AS WELL AS "PREDNISONE."  SOCIAL HISTORY:  Remarkable for longstanding tobacco abuse,  and currently he smokes half a pack per day.  He does not drink alcohol or use illicit drugs.  FAMILY HISTORY:  Remarkable for cancer, as well as heart disease.  REVIEW OF SYSTEMS:  Ten-point review of systems is unremarkable except for that listed in history of present illness.  PHYSICAL EXAMINATION:  GENERAL:  He is a thin male in no acute distress. VITAL SIGNS:  Blood pressure is 101/63, pulse is 82, temperature is 98, respiratory rate 18, oxygen saturation on 2 L is 100%. HEENT:  Pupils are equal, round, and reactive to light and accommodation.  Extraocular muscles are intact.  Nares are patent.  No discharge.  Oropharynx is clear. NECK:  Supple without JVD, lymphadenopathy, or thyromegaly. CHEST:  Totally clear to auscultation, with no wheezes. CARDIAC:  Slight irregularity, but a controlled ventricular response. ABDOMEN:  Soft, nontender, nondistended with good bowel sounds. GENITAL:  Rectal and breast  exam was not done and not indicated. EXTREMITIES:  Lower extremities show mild edema, but he does have a swollen right arm after fistula placement.  There is no cyanosis. NEUROLOGIC:  The patient is alert and oriented and  moves all 4 extremities without obvious deficits.  IMPRESSION:  Pulmonary nodules of unknown significance.  Both of these have enlarged in size since his CAT scan 8 years ago, and especially from his PET scan 2 years ago which did not show any increased uptake. It is unclear whether these nodules are inflammatory or whether they represent a possible bronchogenic cancer given his smoking history.  Can also not exclude possible colon cancer metastasis, however, this would be very unlikely without significant involvement of the liver.  At this point, the patient needs a PET scan.  However, we must keep in mind that his right lower lobe lesion is more of a ground-glass opacity and can still represent cancer with a negative PET signature.  RECOMMENDATIONS: 1.  Please schedule for PET scan as an outpatient.  Depending upon the     results, the patient may need a transthoracic needle aspiration of     1 or both of his abnormal lesions. 2. Encourage the patient about total smoking cessation. 3. The patient needs to be scheduled for an appointment in the     pulmonary office after his PET scan is done so that we can review     the results with him and come up with a plan regarding management.     Kathee Delton, MD,FCCP     [facility]/MEDQ  D:  04/03/2013  T:  04/03/2013  Job:  [id number]

## 2013-04-03 NOTE — Progress Notes (Signed)
Patient Name: Joe Vincent      SUBJECTIVE: admitted with bradycardia atypical chest pains and K>7  HD emergentyly with resolution of bradycardia CAth neg 2013 Echo pending  Past Medical History  Diagnosis Date  . Diabetes mellitus   . Hypertension   . ESRD on hemodialysis     On HD in [place] at [place] unit.  Nephrologist is Dr. Hinda Lenis. Gets HD on TTS schedule.   [name] Retinopathy   . Peripheral neuropathy   . Chest pain     a. 05/2011 Cath: nonobs dzs.  . Myocardial infarction 2000  . Depression   . Liver disease   . Cancer     Colon  . GERD (gastroesophageal reflux disease)   . CAD (coronary artery disease)   . OA (osteoarthritis)     Hx: of  . Anxiety   . Esophagitis 2013    and gastritis by EGD    Scheduled Meds:  Scheduled Meds: . feeding supplement (NEPRO CARB STEADY)  237 mL Oral TID WC  . folic acid  1 mg Oral Daily  . gabapentin  400 mg Oral TID  . heparin subcutaneous  5,000 Units Subcutaneous 3 times per day  . insulin aspart  0-5 Units Subcutaneous QHS  . insulin aspart  0-9 Units Subcutaneous TID WC  . insulin glargine  10 Units Subcutaneous QHS  . ipratropium-albuterol  3 mL Nebulization TID  . multivitamin  1 tablet Oral QHS  . naproxen  500 mg Oral BID WC  . pantoprazole  40 mg Oral Daily  . sodium chloride  3 mL Intravenous Q12H   Continuous Infusions:   PHYSICAL EXAM Filed Vitals:   04/02/13 1858 04/02/13 2023 04/03/13 0454 04/03/13 0937  BP: 102/72 92/61 94/53  101/63  Pulse: 79 87 95 82  Temp: 97.7 F (36.5 C) 98.1 F (36.7 C) 98.4 F (36.9 C) 98 F (36.7 C)  TempSrc: Oral Oral Oral Oral  Resp: 16 20 16 18   Height:      Weight:      SpO2:  99% 100% 100%   Well developed and nourished in no acute distress HENT normal Neck supple with JVP-flat Clear Regular rate and rhythm, 2/6Abd-soft with active BS No Clubbing cyanosis edema Skin-warm and dry A & Oriented  Grossly normal sensory and motor  function     Intake/Output Summary (Last 24 hours) at 04/03/13 1244 Last data filed at 04/03/13 1038  Gross per 24 hour  Intake   1683 ml  Output   -100 ml  Net   1783 ml    LABS: Basic Metabolic Panel:  Recent Labs Lab 04/01/13 1316 04/01/13 1433 04/01/13 1719 04/01/13 1830 04/01/13 2250 04/02/13 0300  NA  --  132*  --   --   --  134*  K  --  >7.7* >7.7* 4.2  --  5.0  CL  --  97  --   --   --  91*  CO2  --  13*  --   --   --  22  GLUCOSE  --  225*  --   --   --  119*  BUN  --  87*  --   --   --  27*  CREATININE  --  9.16*  --   --  4.09* 4.59*  CALCIUM  --  8.6  --   --   --  8.4  MG 1.4*  --   --   --   --   --  Cardiac Enzymes:  Recent Labs  04/01/13 2250 04/02/13 0300 04/02/13 1205  TROPONINI <0.30 <0.30 <0.30   CBC:  Recent Labs Lab 04/01/13 1433 04/01/13 2250 04/02/13 0300  WBC 10.1 9.4 8.7  NEUTROABS 7.6  --   --   HGB 13.5 12.5* 11.5*  HCT 40.7 36.8* 35.3*  MCV 94.2 92.5 94.6  PLT 157 138* 137*   PROTIME: No results found for this basename: LABPROT, INR,  in the last 72 hours Liver Function Tests:  Recent Labs  04/01/13 1433  AST 28  ALT 34  ALKPHOS 201*  BILITOT <0.2*  PROT 7.8  ALBUMIN 3.3*   No results found for this basename: LIPASE, AMYLASE,  in the last 72 hours BNP: BNP (last 3 results)  Recent Labs  08/17/12 0746  PROBNP 3324.0*   D-Dimer: No results found for this basename: DDIMER,  in the last 72 hours Hemoglobin A1C:  Recent Labs  04/01/13 2250  HGBA1C 8.9*   Fasting Lipid Panel: No results found for this basename: CHOL, HDL, LDLCALC, TRIG, CHOLHDL, LDLDIRECT,  in the last 72 hours Thyroid Function Tests:  Recent Labs  04/01/13 2250  TSH 1.023   Anemia Panel:   ASSESSMENT AND PLAN:  Principal Problem:   Severe bradycardia Active Problems:   Diabetes mellitus   Hypertension   CAD (coronary artery disease)   ESRD on hemodialysis   Hyperkalemia   Chest pain   Atrial fibrillation    Symptomatic bradycardia   Protein-calorie malnutrition, severe  Very frustrated regarding problem with K and says never gets answers I spoke with Dr Justin Mend who will ask Dr Florene Glen specifically to talk with pt regarding K problem and dialuyss and dietary issues  Is he candidate for peritoneal dialysis  Await echo   Signed, Virl Axe MD  04/03/2013

## 2013-04-03 NOTE — Progress Notes (Signed)
[facility] Progress Note [place] TEAM 1 - Stepdown/ICU TEAM   Joe Vincent TDV:[id number] DOB: 1960-09-28 DOA: 04/01/2013 PCP: Joe Labrum, MD  Brief narrative: Joe Vincent is a 53 y.o. male presenting on 04/01/2013 with history of diabetes, Hep C, ESRD on hemodialysis, COPD history of colon cancer status post ileostomy and lung cancer presented to the ER for chest pain, shortness of breath and chills.  Admitted to [facility] in 12/14 with septic shock and Acute resp failure with e coli PNA. Had an episode of A-fib with RVR (CHADS VASc2 was 1) Patient reported that he was scheduled for hemodialysis TTS in [place], however he missed the hemodialysis on Thursday due to snow. Patient was rescheduled to have hemodialysis on 2/27 - he woke up with 10/10 chest pain with shortness of breath nausea and dizziness and did not feel that he could make it to the hemodialysis center. Patient's friend called EMS and patient was brought to the ER.  Patient complaint of having generalized weakness for about 6 months and chest pain for at least a month however worse on the day of admission with sensation of "something sitting on my chest". He was given one nitroglycerin in route and was noted to have severe bradycardia with his heart rate in 20s and in junctional rhythm. He is on beta blocker as outpatient.  Labs in the ER showed a potassium >7.7, BUN 87, creatinine 9.16 magnesium 1.4. EKG showed heart rate of 36.  He was treated with calcium, insulin, bicarbonate, albuterol, D50 with improvement in his cardiac rhythm in the ER. Nephrology was consulted for emergent hemodialysis cardiology was also consulted by EDP..   Subjective: Continues to have pain in legs along with weakness- no other complaints.   Assessment/Plan: Principal Problem:   Severe bradycardia- junctional rhythm- h/o Atrial fibrillation -symptomatic in the setting of severe hyperkalemia, beta blocker, recent history of rapid atrial  fibrillation - corrected with emergent hemodialysis - EP following- f/u on ECHO  Active Problems:   Chest pressure - CTA negative- likely was from pulm edema in setting of missed dialysis   Hyperkalemia - resolved  Spiculated mass - h/o lung CA in 2003 treated with chemo and radiation - left lung apex - stable - right lung base- enlarging - nodule in middle lobe- enlarging -d/w pulm- Dr Joe Vincent has evaluated today- f/u on dictation Thrombocytopenia - follow  Severe peripheral neuropathy - has had work up as outpt including MRIs and f/u with neurology (per patient) - check B 12 - PT eval  Hep C    Diabetes mellitus - stable on current meds    Hypertension - BP low- follow - hold antihypertensives    ESRD on hemodialysis - per nephrology     Code Status: DNR Family Communication: none Disposition Plan: Pt eval- possible SNF  Consultants: nephrology  Procedures: none  Antibiotics: Antibiotics Given (last 72 hours)   None       DVT prophylaxis: Heparin  Objective: Filed Weights   04/01/13 2245 04/02/13 1503 04/02/13 1830  Weight: 63.9 kg (140 lb 14 oz) 65 kg (143 lb 4.8 oz) 65.1 kg (143 lb 8.3 oz)   Blood pressure 101/63, pulse 82, temperature 98 F (36.7 C), temperature source Oral, resp. rate 18, height 6' (1.829 m), weight 65.1 kg (143 lb 8.3 oz), SpO2 99.00%.  Intake/Output Summary (Last 24 hours) at 04/03/13 1433 Last data filed at 04/03/13 1300  Gross per 24 hour  Intake   2163 ml  Output   -  100 ml  Net   2263 ml     Exam: General: AAO x 3, No acute respiratory distress Lungs: Clear to auscultation bilaterally without wheezes or crackles Cardiovascular: Regular rate and rhythm without murmur gallop or rub normal S1 and S2 Abdomen: Nontender, nondistended, soft, bowel sounds positive, no rebound, no ascites, no appreciable mass Extremities: No significant cyanosis, clubbing, or edema bilateral lower extremities  Data Reviewed: Basic  Metabolic Panel:  Recent Labs Lab 04/01/13 1316 04/01/13 1433 04/01/13 1719 04/01/13 1830 04/01/13 2250 04/02/13 0300  NA  --  132*  --   --   --  134*  K  --  >7.7* >7.7* 4.2  --  5.0  CL  --  97  --   --   --  91*  CO2  --  13*  --   --   --  22  GLUCOSE  --  225*  --   --   --  119*  BUN  --  87*  --   --   --  27*  CREATININE  --  9.16*  --   --  4.09* 4.59*  CALCIUM  --  8.6  --   --   --  8.4  MG 1.4*  --   --   --   --   --    Liver Function Tests:  Recent Labs Lab 04/01/13 1433  AST 28  ALT 34  ALKPHOS 201*  BILITOT <0.2*  PROT 7.8  ALBUMIN 3.3*   No results found for this basename: LIPASE, AMYLASE,  in the last 168 hours No results found for this basename: AMMONIA,  in the last 168 hours CBC:  Recent Labs Lab 04/01/13 1433 04/01/13 2250 04/02/13 0300  WBC 10.1 9.4 8.7  NEUTROABS 7.6  --   --   HGB 13.5 12.5* 11.5*  HCT 40.7 36.8* 35.3*  MCV 94.2 92.5 94.6  PLT 157 138* 137*   Cardiac Enzymes:  Recent Labs Lab 04/01/13 1635 04/01/13 2223 04/01/13 2250 04/02/13 0300 04/02/13 1205  TROPONINI <0.30 <0.30 <0.30 <0.30 <0.30   BNP (last 3 results)  Recent Labs  08/17/12 0746  PROBNP 3324.0*   CBG:  Recent Labs Lab 04/02/13 1305 04/02/13 1910 04/02/13 2029 04/03/13 0817 04/03/13 1213  GLUCAP 127* 100* 121* 126* 108*    Recent Results (from the past 240 hour(s))  MRSA PCR SCREENING     Status: None   Collection Time    04/01/13 10:23 PM      Result Value Ref Range Status   MRSA by PCR NEGATIVE  NEGATIVE Final   Comment:            The GeneXpert MRSA Assay (FDA     approved for NASAL specimens     only), is one component of a     comprehensive MRSA colonization     surveillance program. It is not     intended to diagnose MRSA     infection nor to guide or     monitor treatment for     MRSA infections.     Studies:  Recent x-ray studies have been reviewed in detail by the Attending Physician  Scheduled  Meds:  Scheduled Meds: . feeding supplement (NEPRO CARB STEADY)  237 mL Oral TID WC  . folic acid  1 mg Oral Daily  . gabapentin  400 mg Oral TID  . heparin subcutaneous  5,000 Units Subcutaneous 3 times per day  . insulin aspart  0-5 Units Subcutaneous QHS  . insulin aspart  0-9 Units Subcutaneous TID WC  . insulin glargine  10 Units Subcutaneous QHS  . ipratropium-albuterol  3 mL Nebulization TID  . multivitamin  1 tablet Oral QHS  . naproxen  500 mg Oral BID WC  . pantoprazole  40 mg Oral Daily  . sodium chloride  3 mL Intravenous Q12H   Continuous Infusions:   Time spent on care of this patient: 25 min   Debbe Odea, MD  [facility] Office  [phone] Pager - Text Page per Shea Evans as per below:  On-Call/Text Page:      [email or web address]  If 7PM-7AM, please contact night-coverage [email or web address] 04/03/2013, 2:33 PM   LOS: 2 days

## 2013-04-03 NOTE — Consult Note (Signed)
Dictation #:  [id number]

## 2013-04-03 NOTE — Evaluation (Signed)
Physical Therapy Evaluation Patient Details Name: Joe Vincent MRN: [id number] DOB: 30-May-1960 Today's Date: 04/03/2013 Time: 8119-1478 PT Time Calculation (min): 20 min  PT Assessment / Plan / Recommendation History of Present Illness  Joe Vincent is a 53 y.o. male presenting on 04/01/2013 with history of diabetes, Hep C, ESRD on hemodialysis, COPD history of colon cancer status post ileostomy and lung cancer presented to the ER for chest pain, shortness of breath and chills.  Admitted to [facility] in 12/14 with septic shock and Acute resp failure with e coli PNA. Had an episode of A-fib with RVR (CHADS VASc2 was 1)  Patient reported that he was scheduled for hemodialysis TTS in [place], however he missed the hemodialysis on Thursday due to snow. Patient was rescheduled to have hemodialysis on 2/27 - he woke up with 10/10 chest pain with shortness of breath nausea and dizziness and did not feel that he could make it to the hemodialysis center. Patient's friend called EMS and patient was brought to the ER.  Patient complaint of having generalized weakness for about 6 months and chest pain for at least a month however worse on the day of admission with sensation of "something sitting on my chest". He was given one nitroglycerin in route and was noted to have severe bradycardia with his heart rate in 20s and in junctional rhythm.   Clinical Impression  Patient presents with problems listed below.  Will benefit from acute PT to maximize independence prior to discharge.  Recommend Inpatient Rehab consult for comprehensive therapies to maximize functional independence to return home.    PT Assessment  Patient needs continued PT services    Follow Up Recommendations  CIR;Supervision/Assistance - 24 hour    Does the patient have the potential to tolerate intense rehabilitation      Barriers to Discharge Decreased caregiver support Lives alone    Equipment Recommendations  Wheelchair (measurements  PT);Wheelchair cushion (measurements PT)    Recommendations for Other Services Rehab consult   Frequency Min 4X/week    Precautions / Restrictions Precautions Precautions: Fall Restrictions Weight Bearing Restrictions: No   Pertinent Vitals/Pain Pain impacting mobility      Mobility  Bed Mobility Overal bed mobility: Modified Independent General bed mobility comments: No cues or physical assist to move supine <> sit.  Uses rail with HOB elevated. Transfers Overall transfer level: Needs assistance Equipment used: Rolling walker (2 wheeled) Transfers: Sit to/from Stand Sit to Stand: Mod assist;+2 physical assistance General transfer comment: Verbal cues for hand placement.  Assist to rise to standing, using UE's maximally on RW.  Once upright, verbal cues to extend hips to shift hips forward over feet and stand upright.  Able to stand x 60 seconds.  Was able to take 1 step backward with each foot to get closer to bed.    Exercises     PT Diagnosis: Difficulty walking;Generalized weakness;Acute pain  PT Problem List: Decreased strength;Decreased activity tolerance;Decreased balance;Decreased mobility;Decreased knowledge of use of DME;Cardiopulmonary status limiting activity;Pain PT Treatment Interventions: DME instruction;Gait training;Functional mobility training;Therapeutic exercise;Balance training;Patient/family education     PT Goals(Current goals can be found in the care plan section) Acute Rehab PT Goals Patient Stated Goal: To get stronger PT Goal Formulation: With patient Time For Goal Achievement: 04/17/13 Potential to Achieve Goals: Good  Visit Information  Last PT Received On: 04/03/13 Assistance Needed: +2 History of Present Illness: Joe Vincent is a 53 y.o. male presenting on 04/01/2013 with history of diabetes, Hep C, ESRD on hemodialysis,  COPD history of colon cancer status post ileostomy and lung cancer presented to the ER for chest pain, shortness of breath  and chills.  Admitted to [facility] in 12/14 with septic shock and Acute resp failure with e coli PNA. Had an episode of A-fib with RVR (CHADS VASc2 was 1)  Patient reported that he was scheduled for hemodialysis TTS in [employer], however he missed the hemodialysis on Thursday due to snow. Patient was rescheduled to have hemodialysis on 2/27 - he woke up with 10/10 chest pain with shortness of breath nausea and dizziness and did not feel that he could make it to the hemodialysis center. Patient's friend called EMS and patient was brought to the ER.  Patient complaint of having generalized weakness for about 6 months and chest pain for at least a month however worse on the day of admission with sensation of "something sitting on my chest". He was given one nitroglycerin in route and was noted to have severe bradycardia with his heart rate in 20s and in junctional rhythm.        Prior Functioning  Home Living Family/patient expects to be discharged to:: Inpatient rehab Living Arrangements: Alone Home Equipment: Walker - 2 wheels Prior Function Level of Independence: Independent with assistive device(s) Comments: Noting progressive weakness of LE's for 6 weeks Communication Communication: No difficulties    Cognition  Cognition Arousal/Alertness: Awake/alert Behavior During Therapy: WFL for tasks assessed/performed Overall Cognitive Status: Within Functional Limits for tasks assessed    Extremity/Trunk Assessment Upper Extremity Assessment Upper Extremity Assessment: Overall WFL for tasks assessed (Neuropathy in hands) Lower Extremity Assessment Lower Extremity Assessment: Generalized weakness (Functionally moves with 3/5 strength. Tests weaker.) Cervical / Trunk Assessment Cervical / Trunk Assessment: Normal   Balance Balance Overall balance assessment: Needs assistance Sitting-balance support: No upper extremity supported;Feet supported Sitting balance-Leahy Scale: Good Sitting balance -  Comments: Patient able to lift LE's up to disconnect PAS hose on his own with good balance. Standing balance support: Bilateral upper extremity supported Standing balance-Leahy Scale: Poor  End of Session PT - End of Session Equipment Utilized During Treatment: Gait belt;Oxygen Activity Tolerance: Patient limited by fatigue;Patient limited by pain Patient left: in bed;with call bell/phone within reach;with family/visitor present (Cardiology staff entering for Echo.  Asked them to set alarm) Nurse Communication: Mobility status  GP     Despina Pole 04/03/2013, 6:55 PM Carita Pian. Sanjuana Kava, [place] Pager [phone]

## 2013-04-04 ENCOUNTER — Ambulatory Visit (HOSPITAL_COMMUNITY): Admission: RE | Admit: 2013-04-04 | Payer: Medicare Other | Source: Ambulatory Visit | Admitting: Vascular Surgery

## 2013-04-04 ENCOUNTER — Other Ambulatory Visit: Payer: Self-pay

## 2013-04-04 ENCOUNTER — Encounter (HOSPITAL_COMMUNITY)
Admission: EM | Disposition: A | Discharge status: Nursing Home (Includes ICF) | Payer: Self-pay | Source: Home / Self Care | Attending: Internal Medicine

## 2013-04-04 DIAGNOSIS — I495 Sick sinus syndrome: Secondary | ICD-10-CM

## 2013-04-04 DIAGNOSIS — F332 Major depressive disorder, recurrent severe without psychotic features: Secondary | ICD-10-CM

## 2013-04-04 DIAGNOSIS — E43 Unspecified severe protein-calorie malnutrition: Secondary | ICD-10-CM

## 2013-04-04 DIAGNOSIS — N186 End stage renal disease: Secondary | ICD-10-CM

## 2013-04-04 DIAGNOSIS — I10 Essential (primary) hypertension: Secondary | ICD-10-CM

## 2013-04-04 DIAGNOSIS — Z992 Dependence on renal dialysis: Secondary | ICD-10-CM

## 2013-04-04 LAB — POTASSIUM
Potassium: 6.9 mEq/L (ref 3.7–5.3)
Potassium: 7.6 mEq/L (ref 3.7–5.3)

## 2013-04-04 LAB — BASIC METABOLIC PANEL
BUN: 38 mg/dL — AB (ref 6–23)
CO2: 25 mEq/L (ref 19–32)
CREATININE: 5.72 mg/dL — AB (ref 0.50–1.35)
Calcium: 8.2 mg/dL — ABNORMAL LOW (ref 8.4–10.5)
Chloride: 87 mEq/L — ABNORMAL LOW (ref 96–112)
GFR calc non Af Amer: 10 mL/min — ABNORMAL LOW (ref >90)
GFR, EST AFRICAN AMERICAN: 12 mL/min — AB (ref >90)
Glucose, Bld: 133 mg/dL — ABNORMAL HIGH (ref 70–99)
Potassium: 6.3 mEq/L — ABNORMAL HIGH (ref 3.7–5.3)
Sodium: 126 mEq/L — ABNORMAL LOW (ref 137–147)

## 2013-04-04 LAB — GLUCOSE, CAPILLARY
Glucose-Capillary: 130 mg/dL — ABNORMAL HIGH (ref 70–99)
Glucose-Capillary: 199 mg/dL — ABNORMAL HIGH (ref 70–99)
Glucose-Capillary: 202 mg/dL — ABNORMAL HIGH (ref 70–99)
Glucose-Capillary: 205 mg/dL — ABNORMAL HIGH (ref 70–99)

## 2013-04-04 LAB — VITAMIN B12: Vitamin B-12: 675 pg/mL (ref 211–911)

## 2013-04-04 SURGERY — ASSESSMENT, SHUNT FUNCTION, WITH CONTRAST RADIOGRAPHIC STUDY
Anesthesia: LOCAL | Laterality: Right

## 2013-04-04 MED ORDER — IPRATROPIUM-ALBUTEROL 0.5-2.5 (3) MG/3ML IN SOLN: 3.0000 mL | Freq: Four times a day (QID) | RESPIRATORY_TRACT | Status: DC | PRN

## 2013-04-04 MED ORDER — SODIUM POLYSTYRENE SULFONATE 15 GM/60ML PO SUSP
15.0000 g | Freq: Once | ORAL | Status: AC
  Administered 2013-04-04: 15 g via ORAL
  Filled 2013-04-04: qty 60

## 2013-04-04 MED ORDER — SODIUM CHLORIDE 0.9 % IV SOLN
1.0000 g | Freq: Once | INTRAVENOUS | Status: AC
  Administered 2013-04-04: 1 g via INTRAVENOUS
  Filled 2013-04-04: qty 10

## 2013-04-04 MED ORDER — SODIUM POLYSTYRENE SULFONATE 15 GM/60ML PO SUSP
30.0000 g | Freq: Once | ORAL | Status: AC
  Administered 2013-04-04: 30 g via ORAL
  Filled 2013-04-04: qty 120

## 2013-04-04 NOTE — Significant Event (Signed)
Pt wants to take kayexalate now. Administered it as instructed.

## 2013-04-04 NOTE — Progress Notes (Signed)
Rehab Admissions Coordinator Note:  Patient was screened by Retta Diones for appropriateness for an Inpatient Acute Rehab Consult.  At this time, we are recommending Inpatient Rehab consult.  Retta Diones 04/04/2013, 8:46 AM  I can be reached at [phone].

## 2013-04-04 NOTE — Progress Notes (Signed)
Patient Name: Joe Vincent Date of Encounter: 04/04/2013  Principal Problem:   Severe bradycardia Active Problems:   Diabetes mellitus   Hypertension   CAD (coronary artery disease)   ESRD on hemodialysis   Hyperkalemia   Chest pain   Atrial fibrillation   Symptomatic bradycardia   Protein-calorie malnutrition, severe    SUBJECTIVE: Not concerned about chest pain, worried about legs, cannot walk.  OBJECTIVE Filed Vitals:   04/03/13 2053 04/04/13 0500 04/04/13 0820 04/04/13 0829  BP: 122/59 138/81 113/48   Pulse: 93 103 73   Temp: 98.6 F (37 C) 98.4 F (36.9 C) 99 F (37.2 C)   TempSrc: Oral Oral Oral   Resp: 18 18 16    Height:      Weight: 147 lb 7.8 oz (66.9 kg)     SpO2: 97% 97% 99% 99%    Intake/Output Summary (Last 24 hours) at 04/04/13 0943 Last data filed at 04/03/13 2300  Gross per 24 hour  Intake   1780 ml  Output    500 ml  Net   1280 ml   Filed Weights   04/02/13 1503 04/02/13 1830 04/03/13 2053  Weight: 143 lb 4.8 oz (65 kg) 143 lb 8.3 oz (65.1 kg) 147 lb 7.8 oz (66.9 kg)    PHYSICAL EXAM General: Well developed, well nourished, male in no acute distress. Head: Normocephalic, atraumatic.  Neck: Supple without bruits, JVD slightly elevated. Lungs:  Resp regular and unlabored, few rales bases. Heart: RRR, S1, S2, no S3, S4, 2+ murmur; no rub. Abdomen: Soft, non-tender, non-distended, BS + x 4.  Extremities: No clubbing, cyanosis, no edema.  Neuro: Alert and oriented X 3. Moves all extremities spontaneously. Psych: Normal affect.  LABS: CBC: Recent Labs  04/01/13 1433 04/01/13 2250 04/02/13 0300  WBC 10.1 9.4 8.7  NEUTROABS 7.6  --   --   HGB 13.5 12.5* 11.5*  HCT 40.7 36.8* 35.3*  MCV 94.2 92.5 94.6  PLT 157 138* 885*   Basic Metabolic Panel: Recent Labs  04/01/13 1316  04/02/13 0300 04/04/13 0410  NA  --   < > 134* 126*  K  --   < > 5.0 6.3*  CL  --   < > 91* 87*  CO2  --   < > 22 25  GLUCOSE  --   < > 119* 133*  BUN   --   < > 27* 38*  CREATININE  --   < > 4.59* 5.72*  CALCIUM  --   < > 8.4 8.2*  MG 1.4*  --   --   --   < > = values in this interval not displayed. Liver Function Tests: Recent Labs  04/01/13 1433  AST 28  ALT 34  ALKPHOS 201*  BILITOT <0.2*  PROT 7.8  ALBUMIN 3.3*   Cardiac Enzymes: Recent Labs  04/01/13 2250 04/02/13 0300 04/02/13 1205  TROPONINI <0.30 <0.30 <0.30    Recent Labs  04/01/13 1344  TROPIPOC 0.01   Hemoglobin A1C: Recent Labs  04/01/13 2250  HGBA1C 8.9*   Thyroid Function Tests: Recent Labs  04/01/13 2250  TSH 1.023   TELE:   Not on telemetry    ECHO: 04/03/2013  Study Conclusions Left ventricle: The cavity size was normal. Systolic function was normal. The estimated ejection fraction was in the range of 55% to 60%. Wall motion was normal; there were no regional wall motion abnormalities. Left ventricular diastolic function parameters were normal.  Current Medications:  .  feeding supplement (NEPRO CARB STEADY)  237 mL Oral TID WC  . folic acid  1 mg Oral Daily  . gabapentin  400 mg Oral BID  . heparin subcutaneous  5,000 Units Subcutaneous 3 times per day  . insulin aspart  0-5 Units Subcutaneous QHS  . insulin aspart  0-9 Units Subcutaneous TID WC  . insulin glargine  10 Units Subcutaneous QHS  . ipratropium-albuterol  3 mL Nebulization TID  . multivitamin  1 tablet Oral QHS  . naproxen  500 mg Oral BID WC  . pantoprazole  40 mg Oral Daily  . sodium chloride  3 mL Intravenous Q12H      ASSESSMENT AND PLAN: Principal Problem:   Severe bradycardia - resolved w/ correction of hyperK+. PTA on Lopressor 25 mg BID, BP too low to resume this dose, MD advise on resuming at 12.5 mg BID.     Chest pain - ez neg MI, EF normal by echo, no WMA; non-obs dz by cath 2013. Atypical pain (10/10 for many hours without ECG or enzyme elevation, worse w/ movement, deep insp). No further eval planned.    Atrial fibrillation - hx PAF, not on  telemetry, no irreg HR by exam, MD advise on BB, not anticoag candidate due to fall risk, also hx thrombocytopenia (plt 137 on 02/28).  Active Problems:   Diabetes mellitus   Hypertension   CAD (coronary artery disease)   ESRD on hemodialysis   Hyperkalemia   Symptomatic bradycardia   Protein-calorie malnutrition, severe   Signed, Rosaria Ferries , PA-C 9:43 AM 04/04/2013   Patient seen and examined. Agree with assessment and plan. Complains of leg weekness. K 6.3; rhythm sounds regular; not on telemetry. Tro neg. Willl check ECG.   Troy Sine, MD, [facility] 04/04/2013 12:59 PM

## 2013-04-04 NOTE — Progress Notes (Addendum)
   Obviously fistula is functional as recent dialysis run was complete, so fistulogram is elective.  I would check if the recurrent hyperkalemia is a lab error.  If repeat K is elevated >5.5, I would cancel for today and reschedule for Wednesday afternoon.    - Stat K ordered  Adele Barthel, MD [facility]: [phone] Pager: [phone]  04/04/2013, 9:31 AM   Addendum  Repeat K 6.9. Will cancel case and defer to Nephrology need to dialyze. Will try to reschedule for Wednesday  Adele Barthel, MD [facility] [place] Office: [phone] Pager: [phone]  04/04/2013, 12:10 PM

## 2013-04-04 NOTE — Progress Notes (Signed)
1. ESRD Dialysis TTS [place] missed treatment(due to snow) Thursday presents for urgent dialysis for hyperkalemia; HD Tues unless potassium high 2. HTN Controlled  3. Anemia stable  4. Bone Mineral will evaluate calcium and phosphorus  5. Diabetes  6. Hyperkalemia TTS  7. History of colon Cancer   Subjective: Interval History: Using HD catheter for dialysis.  Objective: Vital signs in last 24 hours: Temp:  [98.2 F (36.8 C)-99 F (37.2 C)] 99 F (37.2 C) (03/02 0820) Pulse Rate:  [73-103] 73 (03/02 0820) Resp:  [16-19] 16 (03/02 0820) BP: (113-138)/(48-81) 113/48 mmHg (03/02 0820) SpO2:  [97 %-99 %] 99 % (03/02 0829) Weight:  [66.9 kg (147 lb 7.8 oz)] 66.9 kg (147 lb 7.8 oz) (03/01 2053) Weight change: 1.9 kg (4 lb 3 oz)  Intake/Output from previous day: 03/01 0701 - 03/02 0700 In: 2020 [P.O.:1320] Out: 500 [Stool:500] Intake/Output this shift:    General appearance: alert and cooperative Resp: clear to auscultation bilaterally Chest wall: no tenderness Cardio: regular rate and rhythm, S1, S2 normal, no murmur, click, rub or gallop Extremities: edema tr, AVG RUE  Lab Results:  Recent Labs  04/01/13 2250 04/02/13 0300  WBC 9.4 8.7  HGB 12.5* 11.5*  HCT 36.8* 35.3*  PLT 138* 137*   BMET:  Recent Labs  04/02/13 0300 04/04/13 0410  NA 134* 126*  K 5.0 6.3*  CL 91* 87*  CO2 22 25  GLUCOSE 119* 133*  BUN 27* 38*  CREATININE 4.59* 5.72*  CALCIUM 8.4 8.2*   No results found for this basename: PTH,  in the last 72 hours Iron Studies: No results found for this basename: IRON, TIBC, TRANSFERRIN, FERRITIN,  in the last 72 hours Studies/Results: No results found.  Scheduled: . feeding supplement (NEPRO CARB STEADY)  237 mL Oral TID WC  . folic acid  1 mg Oral Daily  . gabapentin  400 mg Oral BID  . heparin subcutaneous  5,000 Units Subcutaneous 3 times per day  . insulin aspart  0-5 Units Subcutaneous QHS  . insulin aspart  0-9 Units Subcutaneous TID WC  .  insulin glargine  10 Units Subcutaneous QHS  . ipratropium-albuterol  3 mL Nebulization TID  . multivitamin  1 tablet Oral QHS  . naproxen  500 mg Oral BID WC  . pantoprazole  40 mg Oral Daily  . sodium chloride  3 mL Intravenous Q12H     LOS: 3 days   Kamdin Follett C 04/04/2013,11:37 AM

## 2013-04-04 NOTE — Significant Event (Addendum)
Pt refuses to take kayexalate. Patient verbalizes and is aware that his potassium level is high. Patient educated on importance on taking the medication to help normalize potassium level. Patient states "no, I do not want it. It makes me feel sick. I've had it before." Patient is a GCS of 15. Patient's primary RN made aware. MD Marcello Moores currently in the room talking with patient and made aware. Hameed Kolar, Therapist, sports.

## 2013-04-04 NOTE — Progress Notes (Signed)
[facility]                                                                              Patient Demographics  Joe Vincent, is a 53 y.o. male, DOB - 1960/09/28, ZOX:[id number]  Admit date - 04/01/2013   Admitting Physician Ripudeep Jenna Luo, MD  Outpatient Primary MD for the patient is [name], MD  LOS - 3   Chief Complaint  Patient presents with  . Weakness        Assessment & Plan   Severe bradycardia- junctional rhythm- h/o Atrial fibrillation  -symptomatic in the setting of severe hyperkalemia, beta blocker, recent history of rapid atrial fibrillation  -corrected with emergent hemodialysis  -EP following -Echo:  EF 55-60%  Chest pressure  -CTA negative- likely was from pulm edema in setting of missed dialysis   Hyperkalemia  -K 6.9, will give kayexalate, calcium gluconate, and obtaining EKG -Will repeat K at 1700  Spiculated mass - h/o lung CA in 2003 treated with chemo and radiation  -left lung apex - stable  -right lung base- enlarging  -nodule in middle lobe- enlarging  -Previous hospitalist discussed with Dr Shelle Iron (pulmonologist) -Patient will need an outpatient PET scan at discharge  Thrombocytopenia  -follow   Severe peripheral neuropathy  -has had work up as outpt including MRIs and f/u with neurology (per patient)  -Vitamin B12 675 -PT consulted for evaluation and treatment  Hep C  -on no medications  Diabetes mellitus  -Continue ISS, lantus, and CBG monitoring  Hypertension  -Was low, hypertensive medications held -Currently normatensive -Will montior  ESRD on hemodialysis  -Nephrology consulted and following -Continue renavit  Code Status: DNR  Family Communication: None at bedside.  Disposition Plan: Admitted.  Pending SNF placement.    Time Spent in minutes   30 minutes  Procedures  Echocardiogram Study Conclusions Left ventricle: The cavity size was normal. Systolic function was normal. The estimated ejection  fraction was in the range of 55% to 60%. Wall motion was normal; there were no regional wall motion abnormalities. Left ventricular diastolic function parameters were normal.  Consults   Pulmonology Nephrology Cardiology  DVT Prophylaxis  SCDs  Lab Results  Component Value Date   PLT 137* 04/02/2013    Medications  Scheduled Meds: . feeding supplement (NEPRO CARB STEADY)  237 mL Oral TID WC  . folic acid  1 mg Oral Daily  . gabapentin  400 mg Oral BID  . heparin subcutaneous  5,000 Units Subcutaneous 3 times per day  . insulin aspart  0-5 Units Subcutaneous QHS  . insulin aspart  0-9 Units Subcutaneous TID WC  . insulin glargine  10 Units Subcutaneous QHS  . ipratropium-albuterol  3 mL Nebulization TID  . multivitamin  1 tablet Oral QHS  . naproxen  500 mg Oral BID WC  . pantoprazole  40 mg Oral Daily  . sodium chloride  3 mL Intravenous Q12H   Continuous Infusions:  PRN Meds:.sodium chloride, sodium chloride, acetaminophen, acetaminophen, alum & mag hydroxide-simeth, feeding supplement (NEPRO CARB STEADY), guaiFENesin-dextromethorphan, heparin, HYDROcodone-acetaminophen, HYDROmorphone (DILAUDID) injection, lidocaine (PF), lidocaine-prilocaine, morphine, ondansetron (ZOFRAN) IV, ondansetron, oxycodone, pentafluoroprop-tetrafluoroeth  Antibiotics    Anti-infectives  None      Subjective:   [name] seen and examined today.  Patient still complains of chest pressure and continued shortness of breath.  He is worried about his lung nodules.  Objective:   Filed Vitals:   04/03/13 1809 04/03/13 2001 04/03/13 2053 04/04/13 0500  BP: 119/64  122/59 138/81  Pulse: 92  93 103  Temp: 98.2 F (36.8 C)  98.6 F (37 C) 98.4 F (36.9 C)  TempSrc: Oral  Oral Oral  Resp: 19  18 18   Height:      Weight:   66.9 kg (147 lb 7.8 oz)   SpO2: 98% 98% 97% 97%    Wt Readings from Last 3 Encounters:  04/03/13 66.9 kg (147 lb 7.8 oz)  04/03/13 66.9 kg (147 lb 7.8 oz)  03/18/13  67.178 kg (148 lb 1.6 oz)     Intake/Output Summary (Last 24 hours) at 04/04/13 0758 Last data filed at 04/03/13 2300  Gross per 24 hour  Intake   2020 ml  Output    500 ml  Net   1520 ml    Exam  General: Well developed, thin, NAD, appears stated age  HEENT: NCAT, PERRLA, EOMI, Anicteic Sclera, mucous membranes moist.  Neck: Supple, no JVD, no masses  Cardiovascular: S1 S2 auscultated, no rubs, murmurs or gallops.   Respiratory: Clear to auscultation bilaterally with equal chest rise  Abdomen: Soft, nontender, nondistended, + bowel sounds  Extremities: warm dry without cyanosis clubbing, trace edema, right hand edema, RUE fistula  Neuro: AAOx3, cranial nerves grossly intact. Strength 5/5 in patient's upper and lower extremities bilaterally  Skin: Without rashes exudates or nodules  Psych: Normal affect and demeanor with intact judgement and insight  Data Review   Micro Results Recent Results (from the past 240 hour(s))  MRSA PCR SCREENING     Status: None   Collection Time    04/01/13 10:23 PM      Result Value Ref Range Status   MRSA by PCR NEGATIVE  NEGATIVE Final   Comment:            The GeneXpert MRSA Assay (FDA     approved for NASAL specimens     only), is one component of a     comprehensive MRSA colonization     surveillance program. It is not     intended to diagnose MRSA     infection nor to guide or     monitor treatment for     MRSA infections.    Radiology Reports Ct Angio Chest Pe W/cm &/or Wo Cm  04/02/2013   CLINICAL DATA:  Atypical chest pain, for 1 month and worse today with pleuritic pain  EXAM: CT ANGIOGRAPHY CHEST WITH CONTRAST  TECHNIQUE: Multidetector CT imaging of the chest was performed using the standard protocol during bolus administration of intravenous contrast. Multiplanar CT image reconstructions and MIPs were obtained to evaluate the vascular anatomy.  CONTRAST:  52mL OMNIPAQUE IOHEXOL 350 MG/ML SOLN  COMPARISON:  DG CHEST 1V  PORT dated 04/01/2013; CT ANGIO CHEST dated 07/03/2012  FINDINGS: Thoracic aorta is not dilated. There are no filling defects in the pulmonary arterial system. There is no pericardial or pleural effusion. There is coronary arterial calcification. There are numerous small calcified lymph nodes within the mediastinum suggesting prior granulomatous exposure. There is an 8 mm calcified left upper lobe lung nodule consistent with a granuloma. Scans through the upper abdomen are unremarkable. There are no acute musculoskeletal findings.  Spiculated scarring in the left lung apex is stable. There is spiculated nodular density based against the posterior pleural surface in the right lung base. Previously it measured 11 x 14 mm, and currently it measures 13 x 19 mm. It was previously shown to be non tracer avid on PET scan. There is a bilobed nodular opacity in the anterior inferior right middle lobe. Components of this measure 17 x 9 and 13 x 14 mm as compared to 7 x 10 and 7 x 12 mm previously.  Review of the MIP images confirms the above findings.  IMPRESSION: 1. No evidence of pulmonary embolism 2. Although previously non FDG avid, there are progressively enlarging spiculated masslike lesions in the right lung as described above. These may represent progressive fibrotic change related to prior granulomatous exposure but neoplasm is not excluded. Percutaneous biopsy or PET-CT scan suggested. These results will be called to the ordering clinician or representative by the Radiologist Assistant, and communication documented in the PACS Dashboard.   Electronically Signed   By: Skipper Cliche M.D.   On: 04/02/2013 09:18   Dg Chest Port 1 View  04/01/2013   CLINICAL DATA:  Smoker.  Chest pain.  EXAM: PORTABLE CHEST - 1 VIEW  COMPARISON:  DG CHEST 2 VIEW dated 08/17/2012  FINDINGS: Large bore dual-lumen catheter noted with tip projected over right atrium. Lungs are clear. No pleural effusion or pneumothorax. Heart size normal.  Pacing pads noted over the chest.  IMPRESSION: 1. No acute cardiopulmonary disease. 2. Dialysis catheter noted in good anatomic position.   Electronically Signed   By: Marcello Moores  Register   On: 04/01/2013 13:30    CBC  Recent Labs Lab 04/01/13 1433 04/01/13 2250 04/02/13 0300  WBC 10.1 9.4 8.7  HGB 13.5 12.5* 11.5*  HCT 40.7 36.8* 35.3*  PLT 157 138* 137*  MCV 94.2 92.5 94.6  MCH 31.3 31.4 30.8  MCHC 33.2 34.0 32.6  RDW 15.5 15.3 15.7*  LYMPHSABS 1.3  --   --   MONOABS 1.2*  --   --   EOSABS 0.0  --   --   BASOSABS 0.0  --   --     Chemistries   Recent Labs Lab 04/01/13 1316 04/01/13 1433 04/01/13 1719 04/01/13 1830 04/01/13 2250 04/02/13 0300 04/04/13 0410  NA  --  132*  --   --   --  134* 126*  K  --  >7.7* >7.7* 4.2  --  5.0 6.3*  CL  --  97  --   --   --  91* 87*  CO2  --  13*  --   --   --  22 25  GLUCOSE  --  225*  --   --   --  119* 133*  BUN  --  87*  --   --   --  27* 38*  CREATININE  --  9.16*  --   --  4.09* 4.59* 5.72*  CALCIUM  --  8.6  --   --   --  8.4 8.2*  MG 1.4*  --   --   --   --   --   --   AST  --  28  --   --   --   --   --   ALT  --  34  --   --   --   --   --   ALKPHOS  --  201*  --   --   --   --   --  BILITOT  --  <0.2*  --   --   --   --   --    ------------------------------------------------------------------------------------------------------------------ estimated creatinine clearance is 14.3 ml/min (by C-G formula based on Cr of 5.72). ------------------------------------------------------------------------------------------------------------------  Recent Labs  04/01/13 2250  HGBA1C 8.9*   ------------------------------------------------------------------------------------------------------------------ No results found for this basename: CHOL, HDL, LDLCALC, TRIG, CHOLHDL, LDLDIRECT,  in the last 72  hours ------------------------------------------------------------------------------------------------------------------  Recent Labs  04/01/13 2250  TSH 1.023   ------------------------------------------------------------------------------------------------------------------ No results found for this basename: VITAMINB12, FOLATE, FERRITIN, TIBC, IRON, RETICCTPCT,  in the last 72 hours  Coagulation profile No results found for this basename: INR, PROTIME,  in the last 168 hours  No results found for this basename: DDIMER,  in the last 72 hours  Cardiac Enzymes  Recent Labs Lab 04/01/13 2250 04/02/13 0300 04/02/13 1205  TROPONINI <0.30 <0.30 <0.30   ------------------------------------------------------------------------------------------------------------------ No components found with this basename: POCBNP,     Leonela Kivi D.O. on 04/04/2013 at 7:58 AM  Between 7am to 7pm - Pager - [phone]  After 7pm go to [email or web address] - password [email or web address]  And look for the night coverage person covering for me after hours  [facility] Office  [phone]

## 2013-04-04 NOTE — Progress Notes (Signed)
Physical Therapy Treatment Patient Details Name: Joe Vincent MRN: [id number] DOB: August 29, 1960 Today's Date: 04/04/2013 Time: 6834-1962 PT Time Calculation (min): 41 min  PT Assessment / Plan / Recommendation  History of Present Illness Joe Vincent is a 53 y.o. male presenting on 04/01/2013 with history of diabetes, Hep C, ESRD on hemodialysis, COPD history of colon cancer status post ileostomy and lung cancer presented to the ER for chest pain, shortness of breath and chills.  Admitted to [facility] in 12/14 with septic shock and Acute resp failure with e coli PNA. Had an episode of A-fib with RVR (CHADS VASc2 was 1)  Patient reported that he was scheduled for hemodialysis TTS in [place], however he missed the hemodialysis on Thursday due to snow. Patient was rescheduled to have hemodialysis on 2/27 - he woke up with 10/10 chest pain with shortness of breath nausea and dizziness and did not feel that he could make it to the hemodialysis center. Patient's friend called EMS and patient was brought to the ER.  Patient complaint of having generalized weakness for about 6 months and chest pain for at least a month however worse on the day of admission with sensation of "something sitting on my chest". He was given one nitroglycerin in route and was noted to have severe bradycardia with his heart rate in 20s and in junctional rhythm.    PT Comments   Patient making slow gains with mobility.  Continue to recommend Inpatient Rehab stay prior to discharge.  Follow Up Recommendations  CIR;Supervision/Assistance - 24 hour     Does the patient have the potential to tolerate intense rehabilitation     Barriers to Discharge        Equipment Recommendations  Wheelchair (measurements PT);Wheelchair cushion (measurements PT)    Recommendations for Other Services Rehab consult  Frequency Min 4X/week   Progress towards PT Goals Progress towards PT goals: Progressing toward goals  Plan Current plan remains appropriate     Precautions / Restrictions Precautions Precautions: Fall Restrictions Weight Bearing Restrictions: No   Pertinent Vitals/Pain Pain 9/10 impacting mobility    Mobility  Bed Mobility Overal bed mobility: Modified Independent General bed mobility comments: No cues or physical assist to move supine <> sit.  Uses rail with HOB elevated. Transfers Overall transfer level: Needs assistance Equipment used: Rolling walker (2 wheeled) Transfers: Sit to/from Stand Sit to Stand: Mod assist;+2 physical assistance General transfer comment: Verbal cues for hand placement.  Assist to rise to standing, using UE's maximally on RW.  Once upright, verbal cues to extend hips to shift hips forward over feet and stand upright.  In standing, worked on stepping forward and backward with each foot.  Completed 10 steps with each foot and required sitting rest break.  On second attempt, patient took 3 steps with each foot.  On 4th step with RLE, patient's Lt knee buckled.  Caught patient and assisted to sitting on EOB.     Exercises General Exercises - Lower Extremity Ankle Circles/Pumps: AROM;Both;10 reps;Supine Short Arc Quad: AROM;Both;10 reps;Supine Heel Slides: AROM;Both;10 reps;Supine Hip ABduction/ADduction: AROM;Both;10 reps;Supine Other Exercises Other Exercises: Bridging - 10 reps in supine   PT Diagnosis:    PT Problem List:   PT Treatment Interventions:     PT Goals (current goals can now be found in the care plan section)    Visit Information  Last PT Received On: 04/04/13 Assistance Needed: +2 History of Present Illness: Joe Vincent is a 53 y.o. male presenting on 04/01/2013 with history  of diabetes, Hep C, ESRD on hemodialysis, COPD history of colon cancer status post ileostomy and lung cancer presented to the ER for chest pain, shortness of breath and chills.  Admitted to [facility] in 12/14 with septic shock and Acute resp failure with e coli PNA. Had an episode of A-fib with RVR (CHADS  VASc2 was 1)  Patient reported that he was scheduled for hemodialysis TTS in [place], however he missed the hemodialysis on Thursday due to snow. Patient was rescheduled to have hemodialysis on 2/27 - he woke up with 10/10 chest pain with shortness of breath nausea and dizziness and did not feel that he could make it to the hemodialysis center. Patient's friend called EMS and patient was brought to the ER.  Patient complaint of having generalized weakness for about 6 months and chest pain for at least a month however worse on the day of admission with sensation of "something sitting on my chest". He was given one nitroglycerin in route and was noted to have severe bradycardia with his heart rate in 20s and in junctional rhythm.     Subjective Data  Subjective: "I don't know why my legs are so weak"   Cognition  Cognition Arousal/Alertness: Awake/alert Behavior During Therapy: WFL for tasks assessed/performed Overall Cognitive Status: Within Functional Limits for tasks assessed    Balance  Balance Standing balance support: Bilateral upper extremity supported Standing balance-Leahy Scale: Poor Standing balance comment: Patient depends on UE's on RW to assist with balance/support.  Unable to maintain weight on LE's alone.  End of Session PT - End of Session Equipment Utilized During Treatment: Gait belt Activity Tolerance: Patient limited by fatigue;Patient limited by pain Patient left: in bed;with call bell/phone within reach;with family/visitor present (RUE elevated on pillows due to edema) Nurse Communication: Mobility status   GP     Despina Pole 04/04/2013, 11:25 AM Carita Pian. Sanjuana Kava, [place] Pager [phone]

## 2013-04-05 ENCOUNTER — Inpatient Hospital Stay (HOSPITAL_COMMUNITY): Payer: Medicare Other

## 2013-04-05 DIAGNOSIS — M6281 Muscle weakness (generalized): Secondary | ICD-10-CM

## 2013-04-05 LAB — BASIC METABOLIC PANEL
BUN: 50 mg/dL — ABNORMAL HIGH (ref 6–23)
CALCIUM: 8.4 mg/dL (ref 8.4–10.5)
CO2: 18 mEq/L — ABNORMAL LOW (ref 19–32)
CREATININE: 7.46 mg/dL — AB (ref 0.50–1.35)
Chloride: 90 mEq/L — ABNORMAL LOW (ref 96–112)
GFR calc Af Amer: 9 mL/min — ABNORMAL LOW (ref >90)
GFR, EST NON AFRICAN AMERICAN: 7 mL/min — AB (ref >90)
GLUCOSE: 189 mg/dL — AB (ref 70–99)
Potassium: 6.6 mEq/L (ref 3.7–5.3)
Sodium: 128 mEq/L — ABNORMAL LOW (ref 137–147)

## 2013-04-05 LAB — CK TOTAL AND CKMB (NOT AT ARMC)
CK, MB: 4.1 ng/mL — ABNORMAL HIGH (ref 0.3–4.0)
RELATIVE INDEX: INVALID (ref 0.0–2.5)
Total CK: 71 U/L (ref 7–232)

## 2013-04-05 LAB — CBC
HEMATOCRIT: 35.9 % — AB (ref 39.0–52.0)
HEMOGLOBIN: 11.8 g/dL — AB (ref 13.0–17.0)
MCH: 31.4 pg (ref 26.0–34.0)
MCHC: 32.9 g/dL (ref 30.0–36.0)
MCV: 95.5 fL (ref 78.0–100.0)
Platelets: 112 10*3/uL — ABNORMAL LOW (ref 150–400)
RBC: 3.76 MIL/uL — ABNORMAL LOW (ref 4.22–5.81)
RDW: 15.4 % (ref 11.5–15.5)
WBC: 14.2 10*3/uL — ABNORMAL HIGH (ref 4.0–10.5)

## 2013-04-05 LAB — SEDIMENTATION RATE: SED RATE: 30 mm/h — AB (ref 0–16)

## 2013-04-05 LAB — GLUCOSE, CAPILLARY
GLUCOSE-CAPILLARY: 75 mg/dL (ref 70–99)
Glucose-Capillary: 104 mg/dL — ABNORMAL HIGH (ref 70–99)
Glucose-Capillary: 201 mg/dL — ABNORMAL HIGH (ref 70–99)

## 2013-04-05 MED ORDER — DIPHENHYDRAMINE HCL 25 MG PO CAPS
ORAL_CAPSULE | ORAL | Status: AC
  Filled 2013-04-05: qty 1

## 2013-04-05 MED ORDER — DIPHENHYDRAMINE HCL 25 MG PO CAPS
25.0000 mg | ORAL_CAPSULE | Freq: Three times a day (TID) | ORAL | Status: DC | PRN
  Administered 2013-04-05 – 2013-04-07 (×2): 25 mg via ORAL
  Filled 2013-04-05 (×2): qty 1

## 2013-04-05 MED ORDER — SODIUM CHLORIDE 0.9 % IV SOLN: 100.0000 mL | INTRAVENOUS | Status: DC | PRN

## 2013-04-05 MED ORDER — HEPARIN SODIUM (PORCINE) 1000 UNIT/ML DIALYSIS: 1000.0000 [IU] | INTRAMUSCULAR | Status: DC | PRN

## 2013-04-05 MED ORDER — PENTAFLUOROPROP-TETRAFLUOROETH EX AERO: 1.0000 "application " | INHALATION_SPRAY | CUTANEOUS | Status: DC | PRN

## 2013-04-05 MED ORDER — CAMPHOR-MENTHOL 0.5-0.5 % EX LOTN
TOPICAL_LOTION | CUTANEOUS | Status: DC | PRN
  Filled 2013-04-05 (×2): qty 222

## 2013-04-05 MED ORDER — NEPRO/CARBSTEADY PO LIQD: 237.0000 mL | ORAL | Status: DC | PRN

## 2013-04-05 MED ORDER — LIDOCAINE HCL (PF) 1 % IJ SOLN: 5.0000 mL | INTRAMUSCULAR | Status: DC | PRN

## 2013-04-05 MED ORDER — HEPARIN SODIUM (PORCINE) 1000 UNIT/ML DIALYSIS
20.0000 [IU]/kg | INTRAMUSCULAR | Status: DC | PRN
  Filled 2013-04-05: qty 2

## 2013-04-05 MED ORDER — ALTEPLASE 2 MG IJ SOLR
2.0000 mg | Freq: Once | INTRAMUSCULAR | Status: DC | PRN
  Filled 2013-04-05: qty 2

## 2013-04-05 MED ORDER — LIDOCAINE-PRILOCAINE 2.5-2.5 % EX CREA: 1.0000 "application " | TOPICAL_CREAM | CUTANEOUS | Status: DC | PRN

## 2013-04-05 NOTE — Progress Notes (Signed)
NUTRITION FOLLOW-UP  DOCUMENTATION CODES Per approved criteria  -Severe malnutrition in the context of chronic illness   INTERVENTION: Continue Nepro Shake po TID, each supplement provides 425 kcal and 19 grams protein. Reviewed high-potassium containing foods. Discussed appropriate portion sizes of low- and medium-potassium containing foods, as several low- and medium-potassium foods can result in high potassium content. Pt with several bunches of grapes and tangerines in bedside table, brought by brother. Discussed appropriate portion sizes of these foods - pt states he is eating minimal amounts. Pt denied use of potassium-containing salt substitutes or any other products that are high in salt. Says that this is a "chronic" issue for him. RD to continue to follow nutrition care plan.  NUTRITION DIAGNOSIS: Malnutrition related to chronic disease as evidenced by severe fat and muscle wasting. Ongoing.  Goal: Pt to meet >/= 90% of their estimated nutrition needs   Monitor:  PO intake, supplement acceptance, weight trend, labs  Reason for Assessment: MD Consult   53 y.o. male  Admitting Dx: Severe sinus bradycardia  ASSESSMENT: Patient is a 53 year old male with history of diabetes, hypertension, ESRD on hemodialysis, TTS, history of colon cancer status post ileostomy presented to the ER for chest pain, shortness of breath and chills.  Patient reported that he was scheduled for hemodialysis TTS in [place], however he missed the hemodialysis on Thursday due to snow Diabetes with diabetic nephropathy . Lives alone very little support.   Pt had emergent dialysis due to potassium level of 7.7. Pt started HD Dec 2014, per MD notes pt has been non-compliant with HD PTA.  Pt reports variable intake 2/2 foods at hospital. Discussed appropriate portion sizes of low- and medium-potassium containing foods, as several low- and medium-potassium foods can result in high potassium content. Pt with  several bunches of grapes and tangerines in bedside table. Discussed appropriate portion sizes of these foods. Pt denied use of potassium-containing salt substitutes or any other products that are high in salt. Says that this is a "chronic" issue for him.   Current potassium is 6.6 - ordered for kayexalate.  Height: Ht Readings from Last 1 Encounters:  04/01/13 6' (1.829 m)    Weight: Wt Readings from Last 1 Encounters:  04/05/13 143 lb 4.8 oz (65 kg)   BMI:  Body mass index is 19.43 kg/(m^2).  Estimated Nutritional Needs: Kcal: 2300-2500 Protein: 80-95 grams Fluid: 1.2 L/day  Skin: no open areas noted  Diet Order: Renal/CHO Modified  EDUCATION NEEDS: -No education needs identified at this time   Intake/Output Summary (Last 24 hours) at 04/05/13 1354 Last data filed at 04/05/13 1132  Gross per 24 hour  Intake    240 ml  Output   3500 ml  Net  -3260 ml    Last BM: ileostomy  500 ml 3/1  Labs:   Recent Labs Lab 04/01/13 1316  04/02/13 0300 04/04/13 0410 04/04/13 0931 04/04/13 1645 04/05/13 0446  NA  --   < > 134* 126*  --   --  128*  K  --   < > 5.0 6.3* 6.9* 7.6* 6.6*  CL  --   < > 91* 87*  --   --  90*  CO2  --   < > 22 25  --   --  18*  BUN  --   < > 27* 38*  --   --  50*  CREATININE  --   < > 4.59* 5.72*  --   --  7.46*  CALCIUM  --   < > 8.4 8.2*  --   --  8.4  MG 1.4*  --   --   --   --   --   --   GLUCOSE  --   < > 119* 133*  --   --  189*  < > = values in this interval not displayed.  CBG (last 3)   Recent Labs  04/04/13 1611 04/04/13 2100 04/05/13 1210  GLUCAP 202* 205* 104*   Lab Results  Component Value Date   HGBA1C 8.9* 04/01/2013    Scheduled Meds: . feeding supplement (NEPRO CARB STEADY)  237 mL Oral TID WC  . folic acid  1 mg Oral Daily  . gabapentin  400 mg Oral BID  . heparin subcutaneous  5,000 Units Subcutaneous 3 times per day  . insulin aspart  0-5 Units Subcutaneous QHS  . insulin aspart  0-9 Units Subcutaneous TID WC   . insulin glargine  10 Units Subcutaneous QHS  . multivitamin  1 tablet Oral QHS  . naproxen  500 mg Oral BID WC  . pantoprazole  40 mg Oral Daily  . sodium chloride  3 mL Intravenous Q12H    Continuous Infusions:    Inda Coke MS, RD, LDN Inpatient Registered Dietitian Pager: [phone] After-hours pager: [phone]

## 2013-04-05 NOTE — Procedures (Signed)
Tolerating hemodialysis.  Recurrent high potassium of uncertain cause.  I think exogenous.  No evidence of recirculation.  Will ask for nutrition consult to help.  He denies eating food or supplements. Joe Vincent

## 2013-04-05 NOTE — Progress Notes (Addendum)
[facility]                                                                              Patient Demographics  Joe Vincent, is a 53 y.o. male, DOB - Oct 17, 1960, JOA:[id number]  Admit date - 04/01/2013   Admitting Physician Ripudeep Krystal Eaton, MD  Outpatient Primary MD for the patient is Curlene Labrum, MD  LOS - 4   Chief Complaint  Patient presents with  . Weakness      Interim History Joe Vincent is a 53 y.o. male presenting on 04/01/2013 with history of diabetes, Hep C, ESRD on hemodialysis, COPD history of colon cancer status post ileostomy and lung cancer presented to the ER for chest pain, shortness of breath and chills.  Admitted to [facility] in 12/14 with septic shock and Acute resp failure with e coli PNA. Had an episode of A-fib with RVR (CHADS VASc2 was 1)  Patient reported that he was scheduled for hemodialysis TTS in [place], however he missed the hemodialysis on Thursday due to snow. Patient was rescheduled to have hemodialysis on 2/27 - he woke up with 10/10 chest pain with shortness of breath nausea and dizziness and did not feel that he could make it to the hemodialysis center. Patient's friend called EMS and patient was brought to the ER.  Patient complaint of having generalized weakness for about 6 months and chest pain for at least a month however worse on the day of admission with sensation of "something sitting on my chest". He was given one nitroglycerin in route and was noted to have severe bradycardia with his heart rate in 20s and in junctional rhythm. He is on beta blocker as outpatient.  Labs in the ER showed a potassium >7.7, BUN 87, creatinine 9.16 magnesium 1.4. EKG showed heart rate of 36.  He was treated with calcium, insulin, bicarbonate, albuterol, D50 with improvement in his cardiac rhythm in the ER. Nephrology was consulted for emergent hemodialysis cardiology was also consulted by EDP.[name]    Assessment & Plan   Severe bradycardia- junctional rhythm- h/o  Atrial fibrillation  -symptomatic in the setting of severe hyperkalemia, beta blocker, recent history of rapid atrial fibrillation  -corrected with emergent hemodialysis  -EP following -Echo:  EF 55-60%  Chest pressure  -CTA negative- likely was from pulm edema in setting of missed dialysis   Leukocytosis -Possibly reactive.  Patient is currently afebrile -Will obtain blood cultures and UA if possible, and CXR to rule out infectious process.   Hyperkalemia  -K 6.6, will dialyze today.  Spiculated mass - h/o lung CA in 2003 treated with chemo and radiation  -left lung apex - stable  -right lung base- enlarging  -nodule in middle lobe- enlarging  -Previous hospitalist discussed with Dr [name] (pulmonologist) -Patient will need an outpatient PET scan at discharge  Thrombocytopenia  -Monitor CBC  Severe peripheral neuropathy  -has had work up as outpt including MRIs and f/u with neurology (per patient)  -Vitamin B12 675 -PT consulted for evaluation and treatment  Hep C  -on no medications  Diabetes mellitus  -Continue ISS, lantus, and CBG monitoring  Hypertension  -Was low, hypertensive medications held -Currently normatensive -  Will montior  ESRD on hemodialysis  -Nephrology consulted and following -Continue renavit -Patient scheduled for Right arm fistulogram with possible intervention for 04/06/13 by vascular  Code Status: DNR  Family Communication: None at bedside.  Disposition Plan: Admitted.  Pending SNF placement.    Time Spent in minutes   30 minutes  Procedures  Echocardiogram Study Conclusions Left ventricle: The cavity size was normal. Systolic function was normal. The estimated ejection fraction was in the range of 55% to 60%. Wall motion was normal; there were no regional wall motion abnormalities. Left ventricular diastolic function parameters were normal.  Consults   Pulmonology Nephrology Cardiology Vascular Surgery  DVT Prophylaxis   Heparin  Lab Results  Component Value Date   PLT 112* 04/05/2013    Medications  Scheduled Meds: . diphenhydrAMINE      . feeding supplement (NEPRO CARB STEADY)  237 mL Oral TID WC  . folic acid  1 mg Oral Daily  . gabapentin  400 mg Oral BID  . heparin subcutaneous  5,000 Units Subcutaneous 3 times per day  . insulin aspart  0-5 Units Subcutaneous QHS  . insulin aspart  0-9 Units Subcutaneous TID WC  . insulin glargine  10 Units Subcutaneous QHS  . multivitamin  1 tablet Oral QHS  . naproxen  500 mg Oral BID WC  . pantoprazole  40 mg Oral Daily  . sodium chloride  3 mL Intravenous Q12H   Continuous Infusions:  PRN Meds:.sodium chloride, sodium chloride, acetaminophen, acetaminophen, alteplase, alum & mag hydroxide-simeth, feeding supplement (NEPRO CARB STEADY), guaiFENesin-dextromethorphan, heparin, heparin, HYDROcodone-acetaminophen, HYDROmorphone (DILAUDID) injection, ipratropium-albuterol, lidocaine (PF), lidocaine-prilocaine, morphine, ondansetron (ZOFRAN) IV, ondansetron, oxycodone, pentafluoroprop-tetrafluoroeth  Antibiotics    Anti-infectives   None      Subjective:   Joe Vincent seen and examined today.  Patient does not feel he is ready to go home.  He states he still feels weak and knows his potassium continues to be elevated.  He currently denies complaints of chest pain, abdominal pain. Does not feel his breathing has changed.  Objective:   Filed Vitals:   04/05/13 0729 04/05/13 0800 04/05/13 0830 04/05/13 0900  BP: 109/54 112/72 114/66 104/67  Pulse: 50 90 90 97  Temp:      TempSrc:      Resp:      Height:      Weight:      SpO2:        Wt Readings from Last 3 Encounters:  04/05/13 69 kg (152 lb 1.9 oz)  04/05/13 69 kg (152 lb 1.9 oz)  03/18/13 67.178 kg (148 lb 1.6 oz)     Intake/Output Summary (Last 24 hours) at 04/05/13 0903 Last data filed at 04/05/13 0518  Gross per 24 hour  Intake    480 ml  Output      0 ml  Net    480 ml     Exam  General: Well developed, thin, NAD, appears stated age  HEENT: NCAT, mucous membranes moist, poor dentition  Neck: Supple, no JVD, no masses  Cardiovascular: S1 S2 auscultated, no rubs, murmurs or gallops.   Respiratory: Clear to auscultation bilaterally with equal chest rise  Abdomen: Soft, nontender, nondistended, + bowel sounds  Extremities: warm dry without cyanosis clubbing, trace edema, right hand edema, RUE fistula  Neuro: AAOx3, cranial nerves grossly intact.  Skin: Without rashes exudates or nodules  Psych: Normal affect and demeanor with intact judgement and insight  Data Review   Micro Results Recent  Results (from the past 240 hour(s))  MRSA PCR SCREENING     Status: None   Collection Time    04/01/13 10:23 PM      Result Value Ref Range Status   MRSA by PCR NEGATIVE  NEGATIVE Final   Comment:            The GeneXpert MRSA Assay (FDA     approved for NASAL specimens     only), is one component of a     comprehensive MRSA colonization     surveillance program. It is not     intended to diagnose MRSA     infection nor to guide or     monitor treatment for     MRSA infections.    Radiology Reports Ct Angio Chest Pe W/cm &/or Wo Cm  04/02/2013   CLINICAL DATA:  Atypical chest pain, for 1 month and worse today with pleuritic pain  EXAM: CT ANGIOGRAPHY CHEST WITH CONTRAST  TECHNIQUE: Multidetector CT imaging of the chest was performed using the standard protocol during bolus administration of intravenous contrast. Multiplanar CT image reconstructions and MIPs were obtained to evaluate the vascular anatomy.  CONTRAST:  34mL OMNIPAQUE IOHEXOL 350 MG/ML SOLN  COMPARISON:  DG CHEST 1V PORT dated 04/01/2013; CT ANGIO CHEST dated 07/03/2012  FINDINGS: Thoracic aorta is not dilated. There are no filling defects in the pulmonary arterial system. There is no pericardial or pleural effusion. There is coronary arterial calcification. There are numerous small calcified  lymph nodes within the mediastinum suggesting prior granulomatous exposure. There is an 8 mm calcified left upper lobe lung nodule consistent with a granuloma. Scans through the upper abdomen are unremarkable. There are no acute musculoskeletal findings.  Spiculated scarring in the left lung apex is stable. There is spiculated nodular density based against the posterior pleural surface in the right lung base. Previously it measured 11 x 14 mm, and currently it measures 13 x 19 mm. It was previously shown to be non tracer avid on PET scan. There is a bilobed nodular opacity in the anterior inferior right middle lobe. Components of this measure 17 x 9 and 13 x 14 mm as compared to 7 x 10 and 7 x 12 mm previously.  Review of the MIP images confirms the above findings.  IMPRESSION: 1. No evidence of pulmonary embolism 2. Although previously non FDG avid, there are progressively enlarging spiculated masslike lesions in the right lung as described above. These may represent progressive fibrotic change related to prior granulomatous exposure but neoplasm is not excluded. Percutaneous biopsy or PET-CT scan suggested. These results will be called to the ordering clinician or representative by the Radiologist Assistant, and communication documented in the PACS Dashboard.   Electronically Signed   By: Skipper Cliche M.D.   On: 04/02/2013 09:18   Dg Chest Port 1 View  04/01/2013   CLINICAL DATA:  Smoker.  Chest pain.  EXAM: PORTABLE CHEST - 1 VIEW  COMPARISON:  DG CHEST 2 VIEW dated 08/17/2012  FINDINGS: Large bore dual-lumen catheter noted with tip projected over right atrium. Lungs are clear. No pleural effusion or pneumothorax. Heart size normal. Pacing pads noted over the chest.  IMPRESSION: 1. No acute cardiopulmonary disease. 2. Dialysis catheter noted in good anatomic position.   Electronically Signed   By: Marcello Moores  Register   On: 04/01/2013 13:30    CBC  Recent Labs Lab 04/01/13 1433 04/01/13 2250  04/02/13 0300 04/05/13 0446  WBC 10.1 9.4 8.7 14.2*  HGB 13.5 12.5* 11.5* 11.8*  HCT 40.7 36.8* 35.3* 35.9*  PLT 157 138* 137* 112*  MCV 94.2 92.5 94.6 95.5  MCH 31.3 31.4 30.8 31.4  MCHC 33.2 34.0 32.6 32.9  RDW 15.5 15.3 15.7* 15.4  LYMPHSABS 1.3  --   --   --   MONOABS 1.2*  --   --   --   EOSABS 0.0  --   --   --   BASOSABS 0.0  --   --   --     Chemistries   Recent Labs Lab 04/01/13 1316 04/01/13 1433  04/01/13 2250 04/02/13 0300 04/04/13 0410 04/04/13 0931 04/04/13 1645 04/05/13 0446  NA  --  132*  --   --  134* 126*  --   --  128*  K  --  >7.7*  < >  --  5.0 6.3* 6.9* 7.6* 6.6*  CL  --  97  --   --  91* 87*  --   --  90*  CO2  --  13*  --   --  22 25  --   --  18*  GLUCOSE  --  225*  --   --  119* 133*  --   --  189*  BUN  --  87*  --   --  27* 38*  --   --  50*  CREATININE  --  9.16*  --  4.09* 4.59* 5.72*  --   --  7.46*  CALCIUM  --  8.6  --   --  8.4 8.2*  --   --  8.4  MG 1.4*  --   --   --   --   --   --   --   --   AST  --  28  --   --   --   --   --   --   --   ALT  --  34  --   --   --   --   --   --   --   ALKPHOS  --  201*  --   --   --   --   --   --   --   BILITOT  --  <0.2*  --   --   --   --   --   --   --   < > = values in this interval not displayed. ------------------------------------------------------------------------------------------------------------------ estimated creatinine clearance is 11.3 ml/min (by C-G formula based on Cr of 7.46). ------------------------------------------------------------------------------------------------------------------ No results found for this basename: HGBA1C,  in the last 72 hours ------------------------------------------------------------------------------------------------------------------ No results found for this basename: CHOL, HDL, LDLCALC, TRIG, CHOLHDL, LDLDIRECT,  in the last 72  hours ------------------------------------------------------------------------------------------------------------------ No results found for this basename: TSH, T4TOTAL, FREET3, T3FREE, THYROIDAB,  in the last 72 hours ------------------------------------------------------------------------------------------------------------------  Recent Labs  04/04/13 0410  VITAMINB12 675    Coagulation profile No results found for this basename: INR, PROTIME,  in the last 168 hours  No results found for this basename: DDIMER,  in the last 72 hours  Cardiac Enzymes  Recent Labs Lab 04/01/13 2250 04/02/13 0300 04/02/13 1205  TROPONINI <0.30 <0.30 <0.30   ------------------------------------------------------------------------------------------------------------------ No components found with this basename: POCBNP,     Carolle Ishii D.O. on 04/05/2013 at 9:03 AM  Between 7am to 7pm - Pager - [phone]  After 7pm go to [email or web address] - password [email or web address]  And look for the night coverage person covering for  me after hours  [facility] Office  [phone]

## 2013-04-05 NOTE — ED Provider Notes (Signed)
Medical screening examination/treatment/procedure(s) were conducted as a shared visit with non-physician practitioner(s) and myself.  I personally evaluated the patient during the encounter.   EKG Interpretation None        Houston Siren III, MD 04/05/13 [phone]

## 2013-04-05 NOTE — Progress Notes (Signed)
Inpatient Diabetes Program Recommendations  AACE/ADA: New Consensus Statement on Inpatient Glycemic Control (2013)  Target Ranges:  Prepandial:   less than 140 mg/dL      Peak postprandial:   less than 180 mg/dL (1-2 hours)      Critically ill patients:  140 - 180 mg/dL    Results for DURK, CARMEN (MRN [id number]) as of 04/05/2013 10:38  Ref. Range 04/03/2013 08:17 04/03/2013 12:13 04/03/2013 16:44 04/03/2013 20:51  Glucose-Capillary Latest Range: 70-99 mg/dL 126 (H) 108 (H) 196 (H) 234 (H)     Results for MEAD, SLANE (MRN [id number]) as of 04/05/2013 10:38  Ref. Range 04/04/2013 07:37 04/04/2013 11:51 04/04/2013 16:11 04/04/2013 21:00  Glucose-Capillary Latest Range: 70-99 mg/dL 130 (H) 199 (H) 202 (H) 205 (H)     **Fasting glucose looks great.  Patient having some issues with postprandial CBGs.  Eating 100% of meals.  Note that patient will NPO tomorrow for procedure.   **MD- Please consider adding low dose meal coverage if patient continues to have elevated postprandial CBGs- Novolog 3 units tid with meals (hold if pt NPO, hold if pt eats <50% of meal)   Will follow. Wyn Quaker RN, MSN, CDE Diabetes Coordinator Inpatient Diabetes Program Team Pager: [phone] (8a-10p)

## 2013-04-05 NOTE — Progress Notes (Signed)
PT Cancellation Note  Patient Details Name: Joe Vincent MRN: [id number] DOB: 13-Feb-1960   Cancelled Treatment:    Reason Eval/Treat Not Completed: Patient at procedure or test/unavailable. Pt at HD, will attempt tomorrow as appropriate.   Talli Kimmer 04/05/2013, 9:08 AM

## 2013-04-05 NOTE — Progress Notes (Signed)
Patient Name: Joe Vincent Date of Encounter: 04/05/2013  Principal Problem:   Severe bradycardia Active Problems:   Diabetes mellitus   Hypertension   CAD (coronary artery disease)   ESRD on hemodialysis   Hyperkalemia   Chest pain   Atrial fibrillation   Symptomatic bradycardia   Protein-calorie malnutrition, severe    SUBJECTIVE: Feels OK after HD  OBJECTIVE Filed Vitals:   04/05/13 1030 04/05/13 1100 04/05/13 1132 04/05/13 1233  BP: 103/57 103/54 94/59 105/55  Pulse: 70 103 91 66  Temp:   98.5 F (36.9 C) 98.1 F (36.7 C)  TempSrc:   Oral Oral  Resp:   18 14  Height:      Weight:   143 lb 4.8 oz (65 kg)   SpO2:   95% 99%    Intake/Output Summary (Last 24 hours) at 04/05/13 1332 Last data filed at 04/05/13 1132  Gross per 24 hour  Intake    240 ml  Output   3500 ml  Net  -3260 ml   Filed Weights   04/03/13 2053 04/05/13 0716 04/05/13 1132  Weight: 147 lb 7.8 oz (66.9 kg) 152 lb 1.9 oz (69 kg) 143 lb 4.8 oz (65 kg)    PHYSICAL EXAM General: Well developed, well nourished, male in no acute distress. Head: Normocephalic, atraumatic.  Neck: Supple without bruits, JVD not elevated. Lungs:  Resp regular and unlabored, some rales bases, good air exchange Heart: RRR, S1, S2, no S3, S4, 2+ murmur; no rub. Abdomen: Soft, non-tender, non-distended, BS + x 4.  Extremities: No clubbing, cyanosis, no edema.  Neuro: Alert and oriented X 3. Moves all extremities spontaneously. Psych: Normal affect.  LABS: CBC: Recent Labs  04/05/13 0446  WBC 14.2*  HGB 11.8*  HCT 35.9*  MCV 95.5  PLT 112*   INR:No results found for this basename: INR,  in the last 72 hours Basic Metabolic Panel: Recent Labs  04/04/13 0410  04/04/13 1645 04/05/13 0446  NA 126*  --   --  128*  K 6.3*  < > 7.6* 6.6*  CL 87*  --   --  90*  CO2 25  --   --  18*  GLUCOSE 133*  --   --  189*  BUN 38*  --   --  50*  CREATININE 5.72*  --   --  7.46*  CALCIUM 8.2*  --   --  8.4  < > =  values in this interval not displayed.  Anemia Panel: Recent Labs  04/04/13 0410  VITAMINB12 675    Current Medications:  . feeding supplement (NEPRO CARB STEADY)  237 mL Oral TID WC  . folic acid  1 mg Oral Daily  . gabapentin  400 mg Oral BID  . heparin subcutaneous  5,000 Units Subcutaneous 3 times per day  . insulin aspart  0-5 Units Subcutaneous QHS  . insulin aspart  0-9 Units Subcutaneous TID WC  . insulin glargine  10 Units Subcutaneous QHS  . multivitamin  1 tablet Oral QHS  . naproxen  500 mg Oral BID WC  . pantoprazole  40 mg Oral Daily  . sodium chloride  3 mL Intravenous Q12H      ASSESSMENT AND PLAN: Principal Problem:   Severe bradycardia - resolved with HD for high K+, has been off BB since admit. Since K+ was creeping up again, may not be able to restart BB, even at lower dose.   Chest pain - ez neg  MI, EF normal by echo, no WMA; non-obs dz by cath 2013. Atypical pain (10/10 for many hours without ECG or enzyme elevation, worse w/ movement, deep insp). No further eval planned.   Atrial fibrillation - hx PAF, not on telemetry, no irreg HR by exam, MD advise on BB, not anticoag candidate due to fall risk, also hx thrombocytopenia (plt 137 on 02/28). MD advise on pt being on telemetry after R fistulogram w/ possible pci 03/04.   Otherwise, per IM/Renal Active Problems:   Diabetes mellitus   Hypertension   ESRD on hemodialysis - K+ 7.6 yest, 6.6 today, had HD today   Hyperkalemia   Symptomatic bradycardia   Protein-calorie malnutrition, severe   Signed, Rhonda Barrett , PA-C 1:32 PM 04/05/2013   Patient seen and examined. Agree with assessment and plan. No chest pain. Continues to have hyperkalemia. Complains of muscle weekness. TSH nl. Will check ESR, aldolase, and CPK.   Troy Sine, MD, [facility] 04/05/2013 2:48 PM

## 2013-04-05 NOTE — Progress Notes (Signed)
   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  The patient is aware of that the risks of an angiographic procedure include but are not limited to: bleeding, infection, access site complications, renal failure, embolization, rupture of vessel, dissection, possible need for emergent surgical intervention, possible need for surgical procedures to treat the patient's pathology, anaphylactic reaction to contrast, and stroke and death.  The patient is aware of the risks and agrees to proceed.  Subjective  No complaints  Objective Filed Vitals:   04/04/13 1428 04/04/13 1643 04/04/13 2102 04/05/13 0513  BP:  97/56 108/49 141/47  Pulse:  65 107 58  Temp:  97.9 F (36.6 C) 97.9 F (36.6 C) 98 F (36.7 C)  TempSrc:  Oral Oral Oral  Resp:  18 16 18   Height:      Weight:      SpO2: 96% 90% 97% 94%    Intake/Output Summary (Last 24 hours) at 04/05/13 0719 Last data filed at 04/05/13 0518  Gross per 24 hour  Intake    480 ml  Output      0 ml  Net    480 ml   VASC  R arm slightly swollen throughout, incision well healed, consistent with BVT  Laboratory CBC    Component Value Date/Time   WBC 14.2* 04/05/2013 0446   HGB 11.8* 04/05/2013 0446   HCT 35.9* 04/05/2013 0446   PLT 112* 04/05/2013 0446    BMET    Component Value Date/Time   NA 128* 04/05/2013 0446   K 6.6* 04/05/2013 0446   CL 90* 04/05/2013 0446   CO2 18* 04/05/2013 0446   GLUCOSE 189* 04/05/2013 0446   BUN 50* 04/05/2013 0446   CREATININE 7.46* 04/05/2013 0446   CALCIUM 8.4 04/05/2013 0446   GFRNONAA 7* 04/05/2013 0446   GFRAA 9* 04/05/2013 [place], MD [facility] Office: [phone] Pager: [phone]  04/05/2013, 7:19  AM

## 2013-04-06 ENCOUNTER — Ambulatory Visit (HOSPITAL_COMMUNITY): Admission: RE | Admit: 2013-04-06 | Payer: Medicare Other | Source: Ambulatory Visit | Admitting: Vascular Surgery

## 2013-04-06 ENCOUNTER — Encounter (HOSPITAL_COMMUNITY)
Admission: EM | Disposition: A | Discharge status: Nursing Home (Includes ICF) | Payer: Self-pay | Source: Home / Self Care | Attending: Internal Medicine

## 2013-04-06 ENCOUNTER — Encounter (HOSPITAL_COMMUNITY): Payer: Self-pay | Admitting: Certified Registered"

## 2013-04-06 ENCOUNTER — Inpatient Hospital Stay (HOSPITAL_COMMUNITY): Payer: Medicare Other | Admitting: Certified Registered"

## 2013-04-06 ENCOUNTER — Telehealth: Payer: Self-pay | Admitting: Vascular Surgery

## 2013-04-06 ENCOUNTER — Encounter (HOSPITAL_COMMUNITY): Payer: Medicare Other | Admitting: Certified Registered"

## 2013-04-06 ENCOUNTER — Other Ambulatory Visit: Payer: Self-pay

## 2013-04-06 DIAGNOSIS — N186 End stage renal disease: Secondary | ICD-10-CM

## 2013-04-06 DIAGNOSIS — J189 Pneumonia, unspecified organism: Secondary | ICD-10-CM

## 2013-04-06 DIAGNOSIS — T82898A Other specified complication of vascular prosthetic devices, implants and grafts, initial encounter: Secondary | ICD-10-CM

## 2013-04-06 DIAGNOSIS — Z48812 Encounter for surgical aftercare following surgery on the circulatory system: Secondary | ICD-10-CM

## 2013-04-06 HISTORY — PX: FISTULOGRAM: SHX5832

## 2013-04-06 LAB — BASIC METABOLIC PANEL
BUN: 24 mg/dL — AB (ref 6–23)
CALCIUM: 8.5 mg/dL (ref 8.4–10.5)
CO2: 24 mEq/L (ref 19–32)
Chloride: 93 mEq/L — ABNORMAL LOW (ref 96–112)
Creatinine, Ser: 5.11 mg/dL — ABNORMAL HIGH (ref 0.50–1.35)
GFR, EST AFRICAN AMERICAN: 14 mL/min — AB (ref >90)
GFR, EST NON AFRICAN AMERICAN: 12 mL/min — AB (ref >90)
Glucose, Bld: 131 mg/dL — ABNORMAL HIGH (ref 70–99)
Potassium: 5.4 mEq/L — ABNORMAL HIGH (ref 3.7–5.3)
SODIUM: 134 meq/L — AB (ref 137–147)

## 2013-04-06 LAB — GLUCOSE, CAPILLARY
GLUCOSE-CAPILLARY: 142 mg/dL — AB (ref 70–99)
GLUCOSE-CAPILLARY: 171 mg/dL — AB (ref 70–99)
GLUCOSE-CAPILLARY: 138 mg/dL — AB (ref 70–99)
Glucose-Capillary: 182 mg/dL — ABNORMAL HIGH (ref 70–99)
Glucose-Capillary: 251 mg/dL — ABNORMAL HIGH (ref 70–99)

## 2013-04-06 LAB — CLOSTRIDIUM DIFFICILE BY PCR: Toxigenic C. Difficile by PCR: NEGATIVE

## 2013-04-06 SURGERY — FISTULOGRAM
Anesthesia: General | Site: Arm Upper | Laterality: Right

## 2013-04-06 MED ORDER — VANCOMYCIN HCL IN DEXTROSE 750-5 MG/150ML-% IV SOLN
750.0000 mg | INTRAVENOUS | Status: DC
  Administered 2013-04-07: 750 mg via INTRAVENOUS
  Filled 2013-04-06 (×3): qty 150

## 2013-04-06 MED ORDER — FENTANYL CITRATE 0.05 MG/ML IJ SOLN
INTRAMUSCULAR | Status: DC | PRN
  Administered 2013-04-06: 50 ug via INTRAVENOUS

## 2013-04-06 MED ORDER — IOHEXOL 300 MG/ML  SOLN
INTRAMUSCULAR | Status: DC | PRN
  Administered 2013-04-06: 50 mL via INTRAVENOUS
  Administered 2013-04-06: 10 mL via INTRAVENOUS
  Administered 2013-04-06: 50 mL via INTRAVENOUS

## 2013-04-06 MED ORDER — HEPARIN SODIUM (PORCINE) 1000 UNIT/ML IJ SOLN
INTRAMUSCULAR | Status: DC | PRN
  Administered 2013-04-06: 3000 [IU] via INTRAVENOUS

## 2013-04-06 MED ORDER — 0.9 % SODIUM CHLORIDE (POUR BTL) OPTIME
TOPICAL | Status: DC | PRN
  Administered 2013-04-06: 1000 mL

## 2013-04-06 MED ORDER — EPHEDRINE SULFATE 50 MG/ML IJ SOLN
INTRAMUSCULAR | Status: DC | PRN
Start: 2013-04-06 — End: 2013-04-06
  Administered 2013-04-06 (×2): 10 mg via INTRAVENOUS

## 2013-04-06 MED ORDER — CEFAZOLIN SODIUM-DEXTROSE 2-3 GM-% IV SOLR
INTRAVENOUS | Status: DC | PRN
  Administered 2013-04-06: 2 g via INTRAVENOUS

## 2013-04-06 MED ORDER — DEXTROSE 5 % IV SOLN
1.0000 g | INTRAVENOUS | Status: DC
  Administered 2013-04-06: 1 g via INTRAVENOUS
  Filled 2013-04-06 (×2): qty 1

## 2013-04-06 MED ORDER — LIDOCAINE HCL (CARDIAC) 20 MG/ML IV SOLN
INTRAVENOUS | Status: DC | PRN
  Administered 2013-04-06: 40 mg via INTRAVENOUS

## 2013-04-06 MED ORDER — SODIUM CHLORIDE 0.9 % IR SOLN
Status: DC | PRN
  Administered 2013-04-06: 08:00:00

## 2013-04-06 MED ORDER — SODIUM CHLORIDE 0.9 % IV SOLN
INTRAVENOUS | Status: DC | PRN
  Administered 2013-04-06: 08:00:00 via INTRAVENOUS

## 2013-04-06 MED ORDER — MIDAZOLAM HCL 5 MG/5ML IJ SOLN
INTRAMUSCULAR | Status: DC | PRN
  Administered 2013-04-06: 2 mg via INTRAVENOUS

## 2013-04-06 MED ORDER — PHENYLEPHRINE HCL 10 MG/ML IJ SOLN
INTRAMUSCULAR | Status: DC | PRN
  Administered 2013-04-06 (×2): 80 ug via INTRAVENOUS

## 2013-04-06 MED ORDER — FENTANYL CITRATE 0.05 MG/ML IJ SOLN: 25.0000 ug | INTRAMUSCULAR | Status: DC | PRN

## 2013-04-06 MED ORDER — PROPOFOL 10 MG/ML IV BOLUS
INTRAVENOUS | Status: DC | PRN
  Administered 2013-04-06: 50 mg via INTRAVENOUS
  Administered 2013-04-06: 100 mg via INTRAVENOUS

## 2013-04-06 MED ORDER — VANCOMYCIN HCL 10 G IV SOLR
1500.0000 mg | Freq: Once | INTRAVENOUS | Status: AC
  Administered 2013-04-06: 1500 mg via INTRAVENOUS
  Filled 2013-04-06 (×2): qty 1500

## 2013-04-06 SURGICAL SUPPLY — 67 items
BAG BANDED W/RUBBER/TAPE 36X54 (MISCELLANEOUS) ×3 IMPLANT
BALLN MUSTANG 4.0X40 75 (BALLOONS) ×2
BALLN MUSTANG 4.0X40 75CM (BALLOONS) ×1
BALLN MUSTANG 4X20X135 (BALLOONS) ×3
BALLN MUSTANG 6.0X40 75 (BALLOONS) ×2
BALLN MUSTANG 6.0X40 75CM (BALLOONS) ×1
BALLN MUSTANG 8X20X75 (BALLOONS) ×3
BALLOON MUSTANG 4.0X40 75 (BALLOONS) ×1 IMPLANT
BALLOON MUSTANG 4X20X135 (BALLOONS) ×1 IMPLANT
BALLOON MUSTANG 6.0X40 75 (BALLOONS) ×1 IMPLANT
BALLOON MUSTANG 8X20X75 (BALLOONS) ×1 IMPLANT
BLADE SURG ROTATE 9660 (MISCELLANEOUS) ×3 IMPLANT
CANISTER SUCTION 2500CC (MISCELLANEOUS) ×3 IMPLANT
CATH BEACON 5.038 65CM KMP-01 (CATHETERS) ×3 IMPLANT
CLIP TI MEDIUM 6 (CLIP) ×3 IMPLANT
CLIP TI WIDE RED SMALL 6 (CLIP) ×3 IMPLANT
COVER DOME SNAP 22 D (MISCELLANEOUS) ×3 IMPLANT
COVER PROBE W GEL 5X96 (DRAPES) ×3 IMPLANT
COVER SURGICAL LIGHT HANDLE (MISCELLANEOUS) ×3 IMPLANT
DERMABOND ADVANCED (GAUZE/BANDAGES/DRESSINGS) ×2
DERMABOND ADVANCED .7 DNX12 (GAUZE/BANDAGES/DRESSINGS) ×1 IMPLANT
DEVICE TORQUE KENDALL .025-038 (MISCELLANEOUS) ×3 IMPLANT
ELECT REM PT RETURN 9FT ADLT (ELECTROSURGICAL) ×3
ELECTRODE REM PT RTRN 9FT ADLT (ELECTROSURGICAL) ×1 IMPLANT
GAUZE SPONGE 2X2 8PLY STRL LF (GAUZE/BANDAGES/DRESSINGS) ×1 IMPLANT
GEL ULTRASOUND 20GR AQUASONIC (MISCELLANEOUS) ×3 IMPLANT
GLOVE BIO SURGEON STRL SZ 6.5 (GLOVE) ×2 IMPLANT
GLOVE BIO SURGEON STRL SZ7 (GLOVE) ×3 IMPLANT
GLOVE BIO SURGEONS STRL SZ 6.5 (GLOVE) ×1
GLOVE BIOGEL PI IND STRL 7.0 (GLOVE) ×1 IMPLANT
GLOVE BIOGEL PI IND STRL 7.5 (GLOVE) ×2 IMPLANT
GLOVE BIOGEL PI IND STRL 8 (GLOVE) ×1 IMPLANT
GLOVE BIOGEL PI INDICATOR 7.0 (GLOVE) ×2
GLOVE BIOGEL PI INDICATOR 7.5 (GLOVE) ×4
GLOVE BIOGEL PI INDICATOR 8 (GLOVE) ×2
GOWN STRL REUS W/ TWL LRG LVL3 (GOWN DISPOSABLE) ×3 IMPLANT
GOWN STRL REUS W/TWL LRG LVL3 (GOWN DISPOSABLE) ×6
GUIDEWIRE ANGLED .035X150CM (WIRE) ×3 IMPLANT
INTRODUCER COOK 11FR (CATHETERS) IMPLANT
INTRODUCER SET COOK 14FR (MISCELLANEOUS) IMPLANT
KIT BASIN OR (CUSTOM PROCEDURE TRAY) ×3 IMPLANT
KIT ENCORE 26 ADVANTAGE (KITS) ×3 IMPLANT
KIT ROOM TURNOVER OR (KITS) ×3 IMPLANT
NEEDLE PERC 18GX7CM (NEEDLE) ×3 IMPLANT
NS IRRIG 1000ML POUR BTL (IV SOLUTION) ×3 IMPLANT
PACK CV ACCESS (CUSTOM PROCEDURE TRAY) ×3 IMPLANT
PAD ARMBOARD 7.5X6 YLW CONV (MISCELLANEOUS) ×6 IMPLANT
SET INTRODUCER 12FR PACEMAKER (SHEATH) IMPLANT
SET MICROPUNCTURE 5F STIFF (MISCELLANEOUS) ×6 IMPLANT
SHEATH PINNACLE 6F 10CM (SHEATH) ×3 IMPLANT
SPONGE GAUZE 2X2 STER 10/PKG (GAUZE/BANDAGES/DRESSINGS) ×2
STOPCOCK 4 WAY LG BORE MALE ST (IV SETS) ×3 IMPLANT
SUT MNCRL AB 4-0 PS2 18 (SUTURE) ×6 IMPLANT
SUT PROLENE 6 0 BV (SUTURE) ×3 IMPLANT
SUT SILK 2 0 FS (SUTURE) IMPLANT
SUT VIC AB 3-0 SH 27 (SUTURE) ×2
SUT VIC AB 3-0 SH 27X BRD (SUTURE) ×1 IMPLANT
SYR 20CC LL (SYRINGE) ×3 IMPLANT
SYR 30ML LL (SYRINGE) ×3 IMPLANT
SYRINGE 10CC LL (SYRINGE) ×6 IMPLANT
TAPE CLOTH SURG 4X10 WHT LF (GAUZE/BANDAGES/DRESSINGS) ×3 IMPLANT
TOWEL OR 17X24 6PK STRL BLUE (TOWEL DISPOSABLE) ×3 IMPLANT
TOWEL OR 17X26 10 PK STRL BLUE (TOWEL DISPOSABLE) ×3 IMPLANT
TUBING EXTENTION W/L.L. (IV SETS) ×3 IMPLANT
UNDERPAD 30X30 INCONTINENT (UNDERPADS AND DIAPERS) ×3 IMPLANT
WATER STERILE IRR 1000ML POUR (IV SOLUTION) ×3 IMPLANT
WIRE BENTSON .035X145CM (WIRE) ×3 IMPLANT

## 2013-04-06 NOTE — H&P (View-Only) (Signed)
  Postoperative Access Visit   History of Present Illness  [name] is a 53 y.o. year old male who presents for postoperative follow-up for: R single stage BVT by Dr. Lawson (Date: 03/02/13).  The patient's wounds are healing.  The patient notes no steal symptoms.  He has total right arm swelling which painful and limiting his use of his dominant R arm.  The patient notes he had swelling since the procedure and it has been getting worsen  Past Medical History  Diagnosis Date  . Diabetes mellitus   . Hypertension   . ESRD on hemodialysis     On HD in [place] at [facility] unit.  Nephrologist is Dr. Befakadu. Gets HD on TTS schedule.   . Retinopathy   . Peripheral neuropathy   . Chest pain     a. 05/2011 Cath: nonobs dzs.  . Myocardial infarction 2000  . Depression   . Liver disease   . Cancer     Colon  . GERD (gastroesophageal reflux disease)   . CAD (coronary artery disease)   . OA (osteoarthritis)     Hx: of  . Anxiety     Past Surgical History  Procedure Laterality Date  . Colostomy  Ileostomy    colon cancer  . Amputation      left pinky toe  . Esophagogastroduodenoscopy  05/23/2011    Procedure: ESOPHAGOGASTRODUODENOSCOPY (EGD);  Surgeon: Patrick D Hung, MD;  Location: [facility] END[facility]COPY;  Service: Endoscopy;  Laterality: N/A;  . Colon surgery  2009  . Cardiac catheterization  2012  . Eye surgery    . Av fistula placement Right 03/02/2013    Procedure: INSERTION OF ARTERIOVENOUS (AV) GORE-TEX GRAFT ARM - RIGHT FOREARM VERSUS UPPER ARM;  Surgeon: James D Lawson, MD;  Location: [facility] OR;  Service: Vascular;  Laterality: Right;    History   Social History  . Marital Status: Divorced    Spouse Name: N/A    Number of Children: N/A  . Years of Education: N/A   Occupational History  . Not on file.   Social History Main Topics  . Smoking status: Current Every Day Smoker -- 0.25 packs/day for 30 years    Types: Cigarettes  . Smokeless tobacco: Former User  . Alcohol  Use: No  . Drug Use: No  . Sexual Activity: No   Other Topics Concern  . Not on file   Social History Narrative   All relatives are deceased except a brother-in-law. Mostly cancer but his father had CAD also.     Family History  Problem Relation Age of Onset  . Cancer Mother   . Heart disease Father   . Cancer Father     Current Outpatient Prescriptions on File Prior to Visit  Medication Sig Dispense Refill  . insulin aspart (NOVOLOG) 100 UNIT/ML injection Inject 4-12 Units into the skin 3 (three) times daily as needed for high blood sugar (Uses a sliding scale at home).      . insulin glargine (LANTUS) 100 UNIT/ML injection Inject 15 Units into the skin at bedtime.       . multivitamin (RENA-VIT) TABS tablet Take 1 tablet by mouth daily.      . oxycodone (ROXICODONE) 30 MG immediate release tablet Take 1 tablet (30 mg total) by mouth 3 (three) times daily.  20 tablet  0  . pantoprazole (PROTONIX) 40 MG tablet Take 40 mg by mouth daily.      . sucralfate (CARAFATE) 1 G tablet Take   1 g by mouth 4 (four) times daily -  with meals and at bedtime.      . traZODone (DESYREL) 100 MG tablet Take 1 tablet (100 mg total) by mouth at bedtime.  30 tablet  0   No current facility-administered medications on file prior to visit.    Allergies  Allergen Reactions  . Codeine Swelling and Rash    REVIEW OF SYSTEMS:  (Positives checked otherwise negative)  CARDIOVASCULAR:  []  chest pain, []  chest pressure, []  palpitations, []  shortness of breath when laying flat, []  shortness of breath with exertion,  []  pain in feet when walking, []  pain in feet when laying flat, []  history of blood clot in veins (DVT), []  history of phlebitis, [x]  swelling in R arm, []  varicose veins  PULMONARY:  []  productive cough, []  asthma, []  wheezing  NEUROLOGIC:  []  weakness in arms or legs, []  numbness in arms or legs, []  difficulty speaking or slurred speech, []  temporary loss of vision in one eye, []   dizziness  HEMATOLOGIC:  []  bleeding problems, []  problems with blood clotting too easily  MUSCULOSKEL:  []  joint pain, []  joint swelling  GASTROINTEST:  []  vomiting blood, []  blood in stool     GENITOURINARY:  []  burning with urination, []  blood in urine, [x]  ESRD-HD: T-R-S  PSYCHIATRIC:  []  history of major depression  INTEGUMENTARY:  []  rashes, []  ulcers  For VQI Use Only  PRE-ADM LIVING: Home  AMB STATUS: Ambulatory  Physical Examination Filed Vitals:   03/18/13 1108  BP: 158/74  Pulse: 78  Temp: 98.5 F (36.9 C)   Pulmonary: Sym exp, good air movt, CTAB, no rales, rhonchi, & wheezing  Cardiac: RRR, Nl S1, S2, no Murmurs, rubs or gallops  RUE: Incisions are nearly healed, skin feels warm, hand grip is 3-4/5, sensation in digits is intact, palpable thrill, bruit can be auscultated , entire right arm is swollen  Medical Decision Making  Joe Vincent is a 53 y.o. year old male who presents s/p R single stage BVT with venous outflow complication.  I recommend R arm fistulogram, possible intervention.  I discussed with the patient the nature of angiographic procedures, especially the limited patencies of any endovascular intervention.  The patient is aware of that the risks of an angiographic procedure include but are not limited to: bleeding, infection, access site complications, renal failure, embolization, rupture of vessel, dissection, possible need for emergent surgical intervention, possible need for surgical procedures to treat the patient's pathology, anaphylactic reaction to contrast, and stroke and death.    The patient is aware of the risks and agrees to proceed.  He is scheduled with Dr. Oneida Alar on 20 FEB 15.  Thank you for allowing [place] to participate in this patient's care.  Adele Barthel, MD [facility] [place] Office: [phone] Pager: [phone]  03/18/2013, 1:20 PM

## 2013-04-06 NOTE — Progress Notes (Signed)
PT Cancellation Note  Patient Details Name: Joe Vincent MRN: [id number] DOB: [birth year]   Cancelled Treatment:     Pt off floor at procedure to have Rt arm fistulogram. WIll attempt to see at next available time.    Elie Confer [place], [place] 04/06/2013, 8:57 AM

## 2013-04-06 NOTE — Telephone Encounter (Addendum)
Message copied by Doristine Section on Wed Apr 06, 2013 11:56 AM ------      Message from: Michaele Offer S      Created: Wed Apr 06, 2013 10:42 AM      Regarding: order placed/ needs f/u       FYI- order placed      ----- Message -----         From: Conrad Fobes [place], MD         Sent: 04/06/2013  10:08 AM           To: [facility] Charge Pool            Joe Vincent      [id number]      May 28, 1960                  PROCEDURE:      1.  right basilic vein transposition cannulation under ultrasound guidance      2.  right arm fistulogram      3.  Venoplasty of right innominate vein x 3 (4 mm x 40 mm, 6 mm x 40 mm, 8 mm x 20 mm)            Follow-up: 4 weeks       ------  notified patient of fu appt. with dr. Bridgett Larsson on 05-20-13 at 8:30, mailed appt. letter

## 2013-04-06 NOTE — Interval H&P Note (Signed)
[facility] [place]  History and Physical Update  The patient was interviewed and re-examined.  The patient's previous History and Physical has been reviewed and is unchanged except for: interval admission to hospital for chest pain.  There is no change in the plan of care: R arm fistulogram, possible intervention.  Adele Barthel, MD [facility] [place] Office: [phone] Pager: [phone]  04/06/2013, 7:58 AM

## 2013-04-06 NOTE — Progress Notes (Signed)
ANTIBIOTIC CONSULT NOTE - INITIAL  Pharmacy Consult for Vancomycin  Indication: pneumonia  Allergies  Allergen Reactions  . Codeine Swelling and Rash  . Prednisone Other (See Comments)    unknown    Patient Measurements: Height: 6' (182.9 cm) Weight: 145 lb 11.6 oz (66.1 kg) IBW/kg (Calculated) : 77.6 Dosing Weight: 66.1 kg  Vital Signs: Temp: 98.7 F (37.1 C) (03/04 1720) Temp src: Oral (03/04 1720) BP: 110/63 mmHg (03/04 1720) Pulse Rate: 85 (03/04 1720) Intake/Output from previous day: 03/03 0701 - 03/04 0700 In: 120 [P.O.:120] Out: 3500  Intake/Output from this shift:    Labs:  Recent Labs  04/04/13 0410 04/05/13 0446 04/06/13 0539  WBC  --  14.2*  --   HGB  --  11.8*  --   PLT  --  112*  --   CREATININE 5.72* 7.46* 5.11*   Estimated Creatinine Clearance: 15.8 ml/min (by C-G formula based on Cr of 5.11). No results found for this basename: VANCOTROUGH, Corlis Leak, VANCORANDOM, [place], GENTPEAK, GENTRANDOM, TOBRATROUGH, TOBRAPEAK, TOBRARND, AMIKACINPEAK, AMIKACINTROU, AMIKACIN,  in the last 72 hours   Microbiology: Recent Results (from the past 720 hour(s))  MRSA PCR SCREENING     Status: None   Collection Time    04/01/13 10:23 PM      Result Value Ref Range Status   MRSA by PCR NEGATIVE  NEGATIVE Final   Comment:            The GeneXpert MRSA Assay (FDA     approved for NASAL specimens     only), is one component of a     comprehensive MRSA colonization     surveillance program. It is not     intended to diagnose MRSA     infection nor to guide or     monitor treatment for     MRSA infections.  CULTURE, BLOOD (ROUTINE X 2)     Status: None   Collection Time    04/05/13  8:05 AM      Result Value Ref Range Status   Specimen Description BLOOD HEMODIALYSIS CATHETER   Final   Special Requests BOTTLES DRAWN AEROBIC AND ANAEROBIC 10CC   Final   Culture  Setup Time     Final   Value: 04/05/2013 12:36     Performed at [facility]   Culture     Final   Value:        BLOOD CULTURE RECEIVED NO GROWTH TO DATE CULTURE WILL BE HELD FOR 5 DAYS BEFORE ISSUING A FINAL NEGATIVE REPORT     Performed at [facility]   Report Status PENDING   Incomplete  CULTURE, BLOOD (ROUTINE X 2)     Status: None   Collection Time    04/05/13  8:15 AM      Result Value Ref Range Status   Specimen Description BLOOD HEMODIALYSIS CATHETER   Final   Special Requests BOTTLES DRAWN AEROBIC AND ANAEROBIC 10CC   Final   Culture  Setup Time     Final   Value: 04/05/2013 12:37     Performed at [facility]   Culture     Final   Value:        BLOOD CULTURE RECEIVED NO GROWTH TO DATE CULTURE WILL BE HELD FOR 5 DAYS BEFORE ISSUING A FINAL NEGATIVE REPORT     Performed at [facility]   Report Status PENDING   Incomplete  CLOSTRIDIUM DIFFICILE BY PCR  Status: None   Collection Time    04/06/13  3:40 PM      Result Value Ref Range Status   C difficile by pcr NEGATIVE  NEGATIVE Final    Medical History: Past Medical History  Diagnosis Date  . Diabetes mellitus   . Hypertension   . ESRD on hemodialysis     On HD in [place] at [place] unit.  Nephrologist is Dr. Hinda Lenis. Gets HD on TTS schedule.   [name] Retinopathy   . Peripheral neuropathy   . Chest pain     a. 05/2011 Cath: nonobs dzs.  . Myocardial infarction 2000  . Depression   . Liver disease   . Cancer     Colon  . GERD (gastroesophageal reflux disease)   . CAD (coronary artery disease)   . OA (osteoarthritis)     Hx: of  . Anxiety   . Esophagitis 2013    and gastritis by EGD    Medications:  Prescriptions prior to admission  Medication Sig Dispense Refill  . b complex-vitamin c-folic acid (NEPHRO-VITE) 0.8 MG TABS tablet Take 1 tablet by mouth at bedtime.      . ciprofloxacin (CIPRO) 500 MG tablet Take 500 mg by mouth daily with breakfast.      . folic acid (FOLVITE) 1 MG tablet Take 1 mg by mouth daily.      [name] gabapentin (NEURONTIN) 400 MG capsule  Take 400 mg by mouth 3 (three) times daily.       [name] HYDROcodone-acetaminophen (NORCO/VICODIN) 5-325 MG per tablet Take 1 tablet by mouth every 6 (six) hours as needed for moderate pain.      [name] ibuprofen (ADVIL,MOTRIN) 800 MG tablet Take 800 mg by mouth every 8 (eight) hours as needed for mild pain.       [name] insulin aspart (NOVOLOG) 100 UNIT/ML injection Inject 4-12 Units into the skin 3 (three) times daily as needed for high blood sugar (Uses a sliding scale at home).      . insulin glargine (LANTUS) 100 UNIT/ML injection Inject 10 Units into the skin at bedtime.       . metoprolol tartrate (LOPRESSOR) 25 MG tablet Take 25 mg by mouth 2 (two) times daily.       . midodrine (PROAMATINE) 10 MG tablet Take 10 mg by mouth daily.       [name] morphine (KADIAN) 80 MG 24 hr capsule Take 80 mg by mouth daily as needed for pain.      . naproxen (NAPROSYN) 500 MG tablet Take 500 mg by mouth 2 (two) times daily with a meal.      . oxycodone (ROXICODONE) 30 MG immediate release tablet Take 30 mg by mouth every 8 (eight) hours as needed for pain.      . pantoprazole (PROTONIX) 40 MG tablet Take 40 mg by mouth daily.      [name] SPIRIVA HANDIHALER 18 MCG inhalation capsule Place 18 mcg into inhaler and inhale daily.       [name] oxyCODONE-acetaminophen (PERCOCET/ROXICET) 5-325 MG per tablet Take 1 tablet by mouth every 6 (six) hours as needed for moderate pain or severe pain.      [name] penicillin v potassium (VEETID) 500 MG tablet Take 500 mg by mouth See admin instructions. Take 1 tablet by mouth 4 times a day until all gone. Patient states he started a week ago from today(04-01-13) patient has 8 tablets left in the bottle       Scheduled:  . ceFEPime (  MAXIPIME) IV  1 g Intravenous Q24H  . feeding supplement (NEPRO CARB STEADY)  237 mL Oral TID WC  . folic acid  1 mg Oral Daily  . gabapentin  400 mg Oral BID  . heparin subcutaneous  5,000 Units Subcutaneous 3 times per day  . insulin aspart  0-5 Units Subcutaneous QHS  .  insulin aspart  0-9 Units Subcutaneous TID WC  . insulin glargine  10 Units Subcutaneous QHS  . multivitamin  1 tablet Oral QHS  . naproxen  500 mg Oral BID WC  . pantoprazole  40 mg Oral Daily  . sodium chloride  3 mL Intravenous Q12H   Assessment: 53 y.o male with ESRD on HD qTTS starting on empiric Cefepime and vancomycin for pneumonia.  PMH also includes HTN, recurrent hyperkalemia and colon cancer, colostomy.  He missed his a HD treatment due to snow this week. He was dialyzed today and plan for HD tomorrow.  Goal of Therapy:  Pre HD vancomycin level = 15-25 mcg/ml  Plan:  Vancomycin 1500 mg IV x 1 tonight the 750 mg IV after each HD (qTTS).  Monitor clinical status and culture results.  Check pre-HD vancomycin level after approximately 3rd dose.  Nicole Cella, RPh Clinical Pharmacist Pager: [phone] 04/06/2013,7:17 PM

## 2013-04-06 NOTE — Preoperative (Signed)
Beta Blockers   Reason not to administer Beta Blockers:Not Applicable 

## 2013-04-06 NOTE — Anesthesia Preprocedure Evaluation (Addendum)
Anesthesia Evaluation  Patient identified by MRN, date of birth, ID band Patient awake    Reviewed: Allergy & Precautions, H&P , NPO status , Patient's Chart, lab work & pertinent test results  Airway Mallampati: II TM Distance: >3 FB Neck ROM: Full    Dental  (+) Poor Dentition, Dental Advisory Given   Pulmonary Current Smoker,          Cardiovascular hypertension, Pt. on medications and Pt. on home beta blockers + CAD and + Past MI     Neuro/Psych    GI/Hepatic GERD-  ,  Endo/Other  diabetes  Renal/GU DialysisRenal disease     Musculoskeletal   Abdominal   Peds  Hematology   Anesthesia Other Findings   Reproductive/Obstetrics                         Anesthesia Physical Anesthesia Plan  ASA: III  Anesthesia Plan: General   Post-op Pain Management:    Induction: Intravenous  Airway Management Planned: LMA  Additional Equipment: None  Intra-op Plan:   Post-operative Plan: Extubation in OR  Informed Consent: I have reviewed the patients History and Physical, chart, labs and discussed the procedure including the risks, benefits and alternatives for the proposed anesthesia with the patient or authorized representative who has indicated his/her understanding and acceptance.   Dental advisory given  Plan Discussed with: CRNA, Anesthesiologist and Surgeon  Anesthesia Plan Comments:         Anesthesia Quick Evaluation

## 2013-04-06 NOTE — Op Note (Signed)
OPERATIVE NOTE   PROCEDURE: 1.  right basilic vein transposition cannulation under ultrasound guidance 2.  right arm fistulogram 3.  Venoplasty of right innominate vein x 3 (4 mm x 40 mm, 6 mm x 40 mm, 8 mm x 20 mm)  PRE-OPERATIVE DIAGNOSIS: Right arm swelling  POST-OPERATIVE DIAGNOSIS: same as above   SURGEON: Adele Barthel, MD  ANESTHESIA: local  ESTIMATED BLOOD LOSS: 5 cc  FINDING(S): 1. 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 Left internal jugular vein tunneled dialysis catheter in superior vena cava  3. Patent fistula > 6 mm throughout  SPECIMEN(S):  None  CONTRAST: 100 cc  INDICATIONS: Joe Vincent is a 53 y.o. male who presents with right arm swelling after placement of right basilic vein transposition.  The patient is scheduled for right arm fistulogram, possible intervention.  The patient is aware the risks include but are not limited to: bleeding, infection, thrombosis of the cannulated access, and possible anaphylactic reaction to the contrast.  The patient is aware of the risks of the procedure and elects to proceed forward.  DESCRIPTION: After full informed written consent was obtained, the patient was brought back to the angiography suite and placed supine upon the angiography table.  The patient was connected to monitoring equipment.  The right arm was prepped and draped in the standard fashion for a percutaneous access intervention.  Under ultrasound guidance, the right basilic vein transposition was cannulated with a micropuncture needle.  The microwire was advanced into the fistula and the needle was exchanged for the a microsheath, which was lodged 2 cm into the access.  The wire was removed and the sheath was connected to the IV extension tubing.  Hand injections were completed to image the access from the antecubitum up to the level of axilla.  The central venous structures were also imaged by hand injections.  Based on the images,  this patient will need: venoplasty of the right innominate vein.  A Benson wire was advanced into the axillary vein and the sheath was exchanged for a short 6-Fr sheath.  Based on the the imaging, a 4 mm x 40 mm angioplasty balloon was selected.  The balloon was centered around the stenosis and inflated to 10 atm for 2 minutes.  On completion imaging, a >75% residual stenosis is present.  At this point, the balloon was exchanged for a 6 mm x 40 mm angioplasty balloon.  The balloon was centered around the stenosis and inflated to 10 atm for 2 minutes.  On completion imaging, a >30% residual stenosis is present.  At this point, the balloon was exchanged for a 8 mm x 20 mm angioplasty balloon.  The balloon was centered around the stenosis and inflated to 16 atm for 2 minutes.  On completion imaging, a >30% residual stenosis is present.  I felt there was some rebound, so I replaced the balloon and inflated it to 10 atm for 4 minutes.  Completion imaging, demonstrated 4.5 mm lumen present, which i felt was adequate.  I felt further dilation with the tunneled dialysis catheter in the vicinity might result in innominate vein rupture.  The wire and balloon were removed from the sheath.  A 4-0 Monocryl purse-string suture was sewn around the sheath.  The sheath was removed while tying down the suture.  A sterile bandage was applied to the puncture site.  COMPLICATIONS: none  CONDITION: stable  Adele Barthel, MD [facility]  Office: [phone] Pager: [phone]  04/06/2013 10:01 AM

## 2013-04-06 NOTE — Progress Notes (Signed)
NT noted that pt came out of bathroom smelling like cigarette smoke. Pt admits to being a smoker but denies smoking in bathroom. Educated pt on dangers of smoking in facility, pt agrees to not smoke on unit. Will continue to monitor.

## 2013-04-06 NOTE — Progress Notes (Signed)
Pt refusing bed alarm after procedure. Up in room with brother, will continue to monitor.

## 2013-04-06 NOTE — Progress Notes (Signed)
Spoke with MD, requested to place pt back on telemetry for time being. Pt placed on box [id number], Mika with CMT notified. Will continue to monitor.

## 2013-04-06 NOTE — Anesthesia Postprocedure Evaluation (Signed)
  Anesthesia Post-op Note  Patient: Joe Vincent  Procedure(s) Performed: Procedure(s): FISTULOGRAM-RIGHT ARM WITH BALLOON ANGIOPLASTY (Right)  Patient Location: PACU  Anesthesia Type:General  Level of Consciousness: awake  Airway and Oxygen Therapy: Patient Spontanous Breathing  Post-op Pain: mild  Post-op Assessment: Post-op Vital signs reviewed  Post-op Vital Signs: Reviewed  Complications: No apparent anesthesia complications

## 2013-04-06 NOTE — Progress Notes (Signed)
Had BVT RUE today  1. ESRD Dialysis TTS [place] missed treatment(due to snow) Thursday presents for urgent dialysis for hyperkalemia; HD Thursday. 2. HTN Controlled  3. Hyperkalemia, recurrent 4. History of colon Cancer, colostomy  Subjective: Interval History: Had AV access procedure today  Objective: Vital signs in last 24 hours: Temp:  [98 F (36.7 C)-100.5 F (38.1 C)] 98 F (36.7 C) (03/04 1115) Pulse Rate:  [65-110] 90 (03/04 1115) Resp:  [14-18] 17 (03/04 1115) BP: (94-115)/(47-60) 95/60 mmHg (03/04 1115) SpO2:  [90 %-97 %] 90 % (03/04 1115) Weight:  [66.1 kg (145 lb 11.6 oz)] 66.1 kg (145 lb 11.6 oz) (03/03 2107) Weight change:   Intake/Output from previous day: 03/03 0701 - 03/04 0700 In: 120 [P.O.:120] Out: 3500  Intake/Output this shift: Total I/O In: 350 [I.V.:350] Out: -   General appearance: alert and cooperative Extremities: edema RUE post op with swelling  Lab Results:  Recent Labs  04/05/13 0446  WBC 14.2*  HGB 11.8*  HCT 35.9*  PLT 112*   BMET:  Recent Labs  04/05/13 0446 04/06/13 0539  NA 128* 134*  K 6.6* 5.4*  CL 90* 93*  CO2 18* 24  GLUCOSE 189* 131*  BUN 50* 24*  CREATININE 7.46* 5.11*  CALCIUM 8.4 8.5   No results found for this basename: PTH,  in the last 72 hours Iron Studies: No results found for this basename: IRON, TIBC, TRANSFERRIN, FERRITIN,  in the last 72 hours Studies/Results: Dg Chest 2 View  04/05/2013   CLINICAL DATA:  Leukocytosis and dialysis patient  EXAM: CHEST  2 VIEW  COMPARISON:  CT ANGIO CHEST W/CM &/OR WO/CM dated 04/02/2013; DG CHEST 1V PORT dated 04/01/2013  FINDINGS: Since the previous chest x-ray there have developed confluent alveolar densities throughout much of the right lung. This is predominantly in the upper lobe. There is a tiny right pleural effusion. The left lung is well-expanded and largely clear. There is density overlying the posterior aspects of the fourth and fifth ribs on the left which may  reflect a confluence of densities but also could reflect early pneumonia. The cardiac silhouette is normal in size. The pulmonary vascularity is not engorged. The dual-lumen large caliber dialysis type catheter appears unchanged.  IMPRESSION: 1. The findings are consistent with progressive pneumonia predominantly in the right upper lobe. A tiny right pleural effusion is suspected. These findings are new since the previous studies. 2. There is parenchymal density projecting in the left upper lobe which may be artifactual, but developing pneumonia here is not excluded. 3. There is no evidence of CHF.   Electronically Signed   By: David  Martinique   On: 04/05/2013 15:13     LOS: 5 days   Tarri Guilfoil C 04/06/2013,3:25 PM

## 2013-04-06 NOTE — Progress Notes (Signed)
[facility] PROGRESS NOTE  Joe Vincent WOE:[id number] DOB: 20-Sep-1960 DOA: 04/01/2013 PCP: Curlene Labrum, MD  Assessment/Plan: Severe bradycardia- junctional rhythm- h/o Atrial fibrillation  -symptomatic in the setting of severe hyperkalemia, beta blocker, recent history of rapid atrial fibrillation  -improved with HD for hyperkalemia -EP following  -Echo: EF 55-60%  -hold BB Chest pressure  -CTA negative- likely was from pulm edema in setting of missed dialysis  -Troponins negative x3 Leukocytosis/HCAP -Possibly reactive. Patient is currently afebrile  -04/05/2013 blood cultures negative -Chest x-ray suggests new right upper lobe infiltrate -start empiric vanco and cefepime Hyperkalemia  -remains somewhat high -CK 71 -ESR 30 Lower extremity weakness -MRI L-spine in pt with lung cancer and back pain Spiculated mass - h/o lung CA in 2003 treated with chemo and radiation  -left lung apex - stable  -right lung base- enlarging  -nodule in middle lobe- enlarging  -Previous hospitalist discussed with Dr [name] (pulmonologist)  -Patient will need an outpatient PET scan at discharge  Thrombocytopenia  -chronic -Monitor CBC  Severe peripheral neuropathy  -has had work up as outpt including MRIs and f/u with neurology (per patient)  -Vitamin B12 675  -PT consulted for evaluation and treatment  Hep C  -on no medications  Diabetes mellitus  -Continue ISS, lantus, and CBG monitoring  -04/01/13 Hemoglobin A1c 8.9 Hypertension  -Was low, hypertensive medications held  -Currently normatensive  -Will montior  ESRD on hemodialysis  -Nephrology consulted and following  -Continue renavit  -Patient scheduled for Right arm fistulogram with possible intervention for 04/06/13 by vascular  Code Status: DNR  Family Communication: None at bedside.  Disposition Plan: Admitted. Pending SNF placement.          Procedures/Studies: Dg Chest 2 View  04/05/2013   CLINICAL DATA:   Leukocytosis and dialysis patient  EXAM: CHEST  2 VIEW  COMPARISON:  CT ANGIO CHEST W/CM &/OR WO/CM dated 04/02/2013; DG CHEST 1V PORT dated 04/01/2013  FINDINGS: Since the previous chest x-ray there have developed confluent alveolar densities throughout much of the right lung. This is predominantly in the upper lobe. There is a tiny right pleural effusion. The left lung is well-expanded and largely clear. There is density overlying the posterior aspects of the fourth and fifth ribs on the left which may reflect a confluence of densities but also could reflect early pneumonia. The cardiac silhouette is normal in size. The pulmonary vascularity is not engorged. The dual-lumen large caliber dialysis type catheter appears unchanged.  IMPRESSION: 1. The findings are consistent with progressive pneumonia predominantly in the right upper lobe. A tiny right pleural effusion is suspected. These findings are new since the previous studies. 2. There is parenchymal density projecting in the left upper lobe which may be artifactual, but developing pneumonia here is not excluded. 3. There is no evidence of CHF.   Electronically Signed   By: Keyona Emrich  [place]   On: 04/05/2013 15:13   Ct Angio Chest Pe W/cm &/or Wo Cm  04/02/2013   CLINICAL DATA:  Atypical chest pain, for 1 month and worse today with pleuritic pain  EXAM: CT ANGIOGRAPHY CHEST WITH CONTRAST  TECHNIQUE: Multidetector CT imaging of the chest was performed using the standard protocol during bolus administration of intravenous contrast. Multiplanar CT image reconstructions and MIPs were obtained to evaluate the vascular anatomy.  CONTRAST:  64m OMNIPAQUE IOHEXOL 350 MG/ML SOLN  COMPARISON:  DG CHEST 1V PORT dated 04/01/2013; CT ANGIO CHEST dated 07/03/2012  FINDINGS: Thoracic aorta is not dilated. There  are no filling defects in the pulmonary arterial system. There is no pericardial or pleural effusion. There is coronary arterial calcification. There are numerous small  calcified lymph nodes within the mediastinum suggesting prior granulomatous exposure. There is an 8 mm calcified left upper lobe lung nodule consistent with a granuloma. Scans through the upper abdomen are unremarkable. There are no acute musculoskeletal findings.  Spiculated scarring in the left lung apex is stable. There is spiculated nodular density based against the posterior pleural surface in the right lung base. Previously it measured 11 x 14 mm, and currently it measures 13 x 19 mm. It was previously shown to be non tracer avid on PET scan. There is a bilobed nodular opacity in the anterior inferior right middle lobe. Components of this measure 17 x 9 and 13 x 14 mm as compared to 7 x 10 and 7 x 12 mm previously.  Review of the MIP images confirms the above findings.  IMPRESSION: 1. No evidence of pulmonary embolism 2. Although previously non FDG avid, there are progressively enlarging spiculated masslike lesions in the right lung as described above. These may represent progressive fibrotic change related to prior granulomatous exposure but neoplasm is not excluded. Percutaneous biopsy or PET-CT scan suggested. These results will be called to the ordering clinician or representative by the Radiologist Assistant, and communication documented in the PACS Dashboard.   Electronically Signed   By: Skipper Cliche M.D.   On: 04/02/2013 09:18   Dg Chest Port 1 View  04/01/2013   CLINICAL DATA:  Smoker.  Chest pain.  EXAM: PORTABLE CHEST - 1 VIEW  COMPARISON:  DG CHEST 2 VIEW dated 08/17/2012  FINDINGS: Large bore dual-lumen catheter noted with tip projected over right atrium. Lungs are clear. No pleural effusion or pneumothorax. Heart size normal. Pacing pads noted over the chest.  IMPRESSION: 1. No acute cardiopulmonary disease. 2. Dialysis catheter noted in good anatomic position.   Electronically Signed   By: Marcello Moores  Register   On: 04/01/2013 13:30         Subjective: Patient complains of a  nonproductive cough. Denies any fevers, chills, chest discomfort, nausea, vomiting, diarrhea. He complains of chronic shortness of breath. No dysuria or hematuria.  Objective: Filed Vitals:   04/06/13 1030 04/06/13 1045 04/06/13 1115 04/06/13 1720  BP: 99/49 104/48 95/60 110/63  Pulse: 93 93 90 85  Temp: 100.5 F (38.1 C)  98 F (36.7 C) 98.7 F (37.1 C)  TempSrc:   Oral Oral  Resp: _0 Height:      Weight:      SpO2: 95% 93% 90% 90%    Intake/Output Summary (Last 24 hours) at 04/06/13 1844 Last data filed at 04/06/13 1000  Gross per 24 hour  Intake    350 ml  Output      0 ml  Net    350 ml   Weight change:  Exam:   General:  Pt is alert, follows commands appropriately, not in acute distress  HEENT: No icterus, No thrush,[place]/AT  Cardiovascular: RRR, S1/S2, no rubs, no gallops  Respiratory: Bilateral crackles, right greater than left. No wheezing. Good air movement.  Abdomen: Soft/+BS, non tender, non distended, no guarding  Extremities: No edema, No lymphangitis, No petechiae, No rashes, no synovitis  Neuro: CN II-XII intact, strength 4-/5 bilateral LE, sensation intact b/l LE  Data Reviewed: Basic Metabolic Panel:  Recent Labs Lab 04/01/13 1316  04/01/13 1433  04/01/13 2250 04/02/13 0300 04/04/13  0410 04/04/13 0931 04/04/13 1645 04/05/13 0446 04/06/13 0539  NA  --   --  132*  --   --  134* 126*  --   --  128* 134*  K  --   --  >7.7*  < >  --  5.0 6.3* 6.9* 7.6* 6.6* 5.4*  CL  --   --  97  --   --  91* 87*  --   --  90* 93*  CO2  --   --  13*  --   --  22 25  --   --  18* 24  GLUCOSE  --   --  225*  --   --  119* 133*  --   --  189* 131*  BUN  --   --  87*  --   --  27* 38*  --   --  50* 24*  CREATININE  --   < > 9.16*  --  4.09* 4.59* 5.72*  --   --  7.46* 5.11*  CALCIUM  --   --  8.6  --   --  8.4 8.2*  --   --  8.4 8.5  MG 1.4*  --   --   --   --   --   --   --   --   --   --   < > = values in this interval not displayed. Liver Function  Tests:  Recent Labs Lab 04/01/13 1433  AST 28  ALT 34  ALKPHOS 201*  BILITOT <0.2*  PROT 7.8  ALBUMIN 3.3*   No results found for this basename: LIPASE, AMYLASE,  in the last 168 hours No results found for this basename: AMMONIA,  in the last 168 hours CBC:  Recent Labs Lab 04/01/13 1433 04/01/13 2250 04/02/13 0300 04/05/13 0446  WBC 10.1 9.4 8.7 14.2*  NEUTROABS 7.6  --   --   --   HGB 13.5 12.5* 11.5* 11.8*  HCT 40.7 36.8* 35.3* 35.9*  MCV 94.2 92.5 94.6 95.5  PLT 157 138* 137* 112*   Cardiac Enzymes:  Recent Labs Lab 04/01/13 1635 04/01/13 2223 04/01/13 2250 04/02/13 0300 04/02/13 1205 04/05/13 1622  CKTOTAL  --   --   --   --   --  71  CKMB  --   --   --   --   --  4.1*  TROPONINI <0.30 <0.30 <0.30 <0.30 <0.30  --    BNP: No components found with this basename: POCBNP,  CBG:  Recent Labs Lab 04/05/13 2106 04/06/13 0545 04/06/13 1006 04/06/13 1113 04/06/13 1719  GLUCAP 75 142* 171* 182* 251*    Recent Results (from the past 240 hour(s))  MRSA PCR SCREENING     Status: None   Collection Time    04/01/13 10:23 PM      Result Value Ref Range Status   MRSA by PCR NEGATIVE  NEGATIVE Final   Comment:            The GeneXpert MRSA Assay (FDA     approved for NASAL specimens     only), is one component of a     comprehensive MRSA colonization     surveillance program. It is not     intended to diagnose MRSA     infection nor to guide or     monitor treatment for     MRSA infections.  CULTURE, BLOOD (ROUTINE X 2)     Status:  None   Collection Time    04/05/13  8:05 AM      Result Value Ref Range Status   Specimen Description BLOOD HEMODIALYSIS CATHETER   Final   Special Requests BOTTLES DRAWN AEROBIC AND ANAEROBIC 10CC   Final   Culture  Setup Time     Final   Value: 04/05/2013 12:36     Performed at [facility]   Culture     Final   Value:        BLOOD CULTURE RECEIVED NO GROWTH TO DATE CULTURE WILL BE HELD FOR 5 DAYS BEFORE  ISSUING A FINAL NEGATIVE REPORT     Performed at [facility]   Report Status PENDING   Incomplete  CULTURE, BLOOD (ROUTINE X 2)     Status: None   Collection Time    04/05/13  8:15 AM      Result Value Ref Range Status   Specimen Description BLOOD HEMODIALYSIS CATHETER   Final   Special Requests BOTTLES DRAWN AEROBIC AND ANAEROBIC 10CC   Final   Culture  Setup Time     Final   Value: 04/05/2013 12:37     Performed at [facility]   Culture     Final   Value:        BLOOD CULTURE RECEIVED NO GROWTH TO DATE CULTURE WILL BE HELD FOR 5 DAYS BEFORE ISSUING A FINAL NEGATIVE REPORT     Performed at [facility]   Report Status PENDING   Incomplete  CLOSTRIDIUM DIFFICILE BY PCR     Status: None   Collection Time    04/06/13  3:40 PM      Result Value Ref Range Status   C difficile by pcr NEGATIVE  NEGATIVE Final     Scheduled Meds: . feeding supplement (NEPRO CARB STEADY)  237 mL Oral TID WC  . folic acid  1 mg Oral Daily  . gabapentin  400 mg Oral BID  . heparin subcutaneous  5,000 Units Subcutaneous 3 times per day  . insulin aspart  0-5 Units Subcutaneous QHS  . insulin aspart  0-9 Units Subcutaneous TID WC  . insulin glargine  10 Units Subcutaneous QHS  . multivitamin  1 tablet Oral QHS  . naproxen  500 mg Oral BID WC  . pantoprazole  40 mg Oral Daily  . sodium chloride  3 mL Intravenous Q12H   Continuous Infusions:    Nieves Barberi, DO  [facility] Pager [phone]  If 7PM-7AM, please contact night-coverage [email or web address] Password [facility] 04/06/2013, 6:44 PM   LOS: 5 days

## 2013-04-06 NOTE — Transfer of Care (Signed)
Immediate Anesthesia Transfer of Care Note  Patient: Joe Vincent  Procedure(s) Performed: Procedure(s): FISTULOGRAM-RIGHT ARM WITH BALLOON ANGIOPLASTY (Right)  Patient Location: PACU  Anesthesia Type:General  Level of Consciousness: sedated  Airway & Oxygen Therapy: Patient Spontanous Breathing and Patient connected to nasal cannula oxygen  Post-op Assessment: Report given to PACU RN, Post -op Vital signs reviewed and stable and Patient moving all extremities  Post vital signs: Reviewed and stable  Complications: No apparent anesthesia complications

## 2013-04-07 ENCOUNTER — Encounter (HOSPITAL_COMMUNITY): Payer: Self-pay | Admitting: Vascular Surgery

## 2013-04-07 ENCOUNTER — Inpatient Hospital Stay
Admission: AD | Admit: 2013-04-07 | Discharge: 2013-04-13 | Disposition: A | Discharge status: Home Health | Payer: Self-pay | Source: Ambulatory Visit | Attending: Internal Medicine | Admitting: Internal Medicine

## 2013-04-07 LAB — CBC
HCT: 33.8 % — ABNORMAL LOW (ref 39.0–52.0)
Hemoglobin: 10.7 g/dL — ABNORMAL LOW (ref 13.0–17.0)
MCH: 30.5 pg (ref 26.0–34.0)
MCHC: 31.7 g/dL (ref 30.0–36.0)
MCV: 96.3 fL (ref 78.0–100.0)
PLATELETS: 162 10*3/uL (ref 150–400)
RBC: 3.51 MIL/uL — ABNORMAL LOW (ref 4.22–5.81)
RDW: 15.5 % (ref 11.5–15.5)
WBC: 11 10*3/uL — ABNORMAL HIGH (ref 4.0–10.5)

## 2013-04-07 LAB — BASIC METABOLIC PANEL
BUN: 34 mg/dL — ABNORMAL HIGH (ref 6–23)
CALCIUM: 8.2 mg/dL — AB (ref 8.4–10.5)
CO2: 19 meq/L (ref 19–32)
Chloride: 89 mEq/L — ABNORMAL LOW (ref 96–112)
Creatinine, Ser: 6.3 mg/dL — ABNORMAL HIGH (ref 0.50–1.35)
GFR calc non Af Amer: 9 mL/min — ABNORMAL LOW (ref >90)
GFR, EST AFRICAN AMERICAN: 11 mL/min — AB (ref >90)
Glucose, Bld: 194 mg/dL — ABNORMAL HIGH (ref 70–99)
Potassium: 5.7 mEq/L — ABNORMAL HIGH (ref 3.7–5.3)
Sodium: 127 mEq/L — ABNORMAL LOW (ref 137–147)

## 2013-04-07 LAB — GLUCOSE, CAPILLARY: GLUCOSE-CAPILLARY: 117 mg/dL — AB (ref 70–99)

## 2013-04-07 MED ORDER — GABAPENTIN 400 MG PO CAPS: 400.0000 mg | ORAL_CAPSULE | Freq: Two times a day (BID) | ORAL | Status: DC

## 2013-04-07 MED ORDER — LIDOCAINE-PRILOCAINE 2.5-2.5 % EX CREA: 1.0000 "application " | TOPICAL_CREAM | CUTANEOUS | Status: DC | PRN

## 2013-04-07 MED ORDER — NEPRO/CARBSTEADY PO LIQD: 237.0000 mL | Freq: Three times a day (TID) | ORAL | Status: AC

## 2013-04-07 MED ORDER — ALTEPLASE 2 MG IJ SOLR: 2.0000 mg | Freq: Once | INTRAMUSCULAR | Status: DC | PRN

## 2013-04-07 MED ORDER — LIDOCAINE HCL (PF) 1 % IJ SOLN: 5.0000 mL | INTRAMUSCULAR | Status: DC | PRN

## 2013-04-07 MED ORDER — SODIUM CHLORIDE 0.9 % IV SOLN: 100.0000 mL | INTRAVENOUS | Status: DC | PRN

## 2013-04-07 MED ORDER — NEPRO/CARBSTEADY PO LIQD: 237.0000 mL | ORAL | Status: DC | PRN

## 2013-04-07 MED ORDER — CAMPHOR-MENTHOL 0.5-0.5 % EX LOTN: TOPICAL_LOTION | CUTANEOUS | Status: AC | PRN

## 2013-04-07 MED ORDER — IPRATROPIUM-ALBUTEROL 0.5-2.5 (3) MG/3ML IN SOLN: 3.0000 mL | Freq: Four times a day (QID) | RESPIRATORY_TRACT | Status: AC | PRN

## 2013-04-07 MED ORDER — HEPARIN SODIUM (PORCINE) 1000 UNIT/ML DIALYSIS: 20.0000 [IU]/kg | INTRAMUSCULAR | Status: DC | PRN

## 2013-04-07 MED ORDER — VANCOMYCIN HCL IN DEXTROSE 750-5 MG/150ML-% IV SOLN: 750.0000 mg | INTRAVENOUS | Status: DC

## 2013-04-07 MED ORDER — HEPARIN SODIUM (PORCINE) 1000 UNIT/ML DIALYSIS: 1000.0000 [IU] | INTRAMUSCULAR | Status: DC | PRN

## 2013-04-07 MED ORDER — PENTAFLUOROPROP-TETRAFLUOROETH EX AERO: 1.0000 "application " | INHALATION_SPRAY | CUTANEOUS | Status: DC | PRN

## 2013-04-07 MED ORDER — CEFEPIME HCL 1 G IJ SOLR: 1.0000 g | INTRAMUSCULAR | Status: DC

## 2013-04-07 NOTE — Progress Notes (Signed)
Patient Name: Joe Vincent Date of Encounter: 04/07/2013  Principal Problem:   Severe bradycardia Active Problems:   Diabetes mellitus   Hypertension   CAD (coronary artery disease)   ESRD on hemodialysis   Hyperkalemia   Chest pain   Atrial fibrillation   Symptomatic bradycardia   Protein-calorie malnutrition, severe   HCAP (healthcare-associated pneumonia)    SUBJECTIVE: Feeling better, surgery went well. No palps, no new chest pain, no SOB  OBJECTIVE Filed Vitals:   04/07/13 0700 04/07/13 0712 04/07/13 0717 04/07/13 0730  BP: 127/67 113/63 128/69 120/67  Pulse: 81 80 76 78  Temp: 97.5 F (36.4 C)     TempSrc: Oral     Resp: 14     Height:      Weight: 154 lb 5.2 oz (70 kg)     SpO2: 93%       Intake/Output Summary (Last 24 hours) at 04/07/13 0801 Last data filed at 04/06/13 1800  Gross per 24 hour  Intake   1285 ml  Output      0 ml  Net   1285 ml   Filed Weights   04/05/13 2107 04/06/13 2116 04/07/13 0700  Weight: 145 lb 11.6 oz (66.1 kg) 147 lb 7.8 oz (66.9 kg) 154 lb 5.2 oz (70 kg)    PHYSICAL EXAM General: Well developed, well nourished, male in no acute distress. Head: Normocephalic, atraumatic.  Neck: Supple without bruits, JVD 8-9 cm. Lungs:  Resp regular and unlabored, rales bases. Heart: RRR, S1, S2, no S3, S4, or murmur; no rub. Abdomen: Soft, non-tender, non-distended, BS + x 4.  Extremities: No clubbing, cyanosis, no edema.  Neuro: Alert and oriented X 3. Moves all extremities spontaneously. Psych: Normal affect.  LABS: CBC: Recent Labs  04/05/13 0446 04/07/13 0500  WBC 14.2* 11.0*  HGB 11.8* 10.7*  HCT 35.9* 33.8*  MCV 95.5 96.3  PLT 112* 563   Basic Metabolic Panel: Recent Labs  04/06/13 0539 04/07/13 0500  NA 134* 127*  K 5.4* 5.7*  CL 93* 89*  CO2 24 19  GLUCOSE 131* 194*  BUN 24* 34*  CREATININE 5.11* 6.30*  CALCIUM 8.5 8.2*   Potassium  Date Value Ref Range Status  04/07/2013 5.7* 3.7 - 5.3 mEq/L Final    04/06/2013 5.4* 3.7 - 5.3 mEq/L Final  04/05/2013 6.6* 3.7 - 5.3 mEq/L Final  04/04/2013 7.6* 3.7 - 5.3 mEq/L Final  04/04/2013 6.9* 3.7 - 5.3 mEq/L Final  04/04/2013 6.3* 3.7 - 5.3 mEq/L Final  04/02/2013 5.0  3.7 - 5.3 mEq/L Final  04/01/2013 4.2  3.7 - 5.3 mEq/L Final   Aldolase: pending  Cardiac Enzymes: Recent Labs  04/05/13 1622  CKTOTAL 71  CKMB 4.1*   Lab Results  Component Value Date   ESRSEDRATE 30* 04/05/2013    TELE:  SR, no significant ectopy      Radiology/Studies: Dg Chest 2 View 04/05/2013   CLINICAL DATA:  Leukocytosis and dialysis patient  EXAM: CHEST  2 VIEW  COMPARISON:  CT ANGIO CHEST W/CM &/OR WO/CM dated 04/02/2013; DG CHEST 1V PORT dated 04/01/2013  FINDINGS: Since the previous chest x-ray there have developed confluent alveolar densities throughout much of the right lung. This is predominantly in the upper lobe. There is a tiny right pleural effusion. The left lung is well-expanded and largely clear. There is density overlying the posterior aspects of the fourth and fifth ribs on the left which may reflect a confluence of densities but also could  reflect early pneumonia. The cardiac silhouette is normal in size. The pulmonary vascularity is not engorged. The dual-lumen large caliber dialysis type catheter appears unchanged.  IMPRESSION: 1. The findings are consistent with progressive pneumonia predominantly in the right upper lobe. A tiny right pleural effusion is suspected. These findings are new since the previous studies. 2. There is parenchymal density projecting in the left upper lobe which may be artifactual, but developing pneumonia here is not excluded. 3. There is no evidence of CHF.   Electronically Signed   By: David  [place]   On: 04/05/2013 15:13     Current Medications:  . ceFEPime (MAXIPIME) IV  1 g Intravenous Q24H  . feeding supplement (NEPRO CARB STEADY)  237 mL Oral TID WC  . folic acid  1 mg Oral Daily  . gabapentin  400 mg Oral BID  . heparin  subcutaneous  5,000 Units Subcutaneous 3 times per day  . insulin aspart  0-5 Units Subcutaneous QHS  . insulin aspart  0-9 Units Subcutaneous TID WC  . insulin glargine  10 Units Subcutaneous QHS  . multivitamin  1 tablet Oral QHS  . naproxen  500 mg Oral BID WC  . pantoprazole  40 mg Oral Daily  . sodium chloride  3 mL Intravenous Q12H  . vancomycin  750 mg Intravenous Q T,Th,Sa-HD      ASSESSMENT AND PLAN: Principal Problem:  Severe bradycardia - resolved with HD for high K+, has been off BB since admit. May not be able to restart BB, even at lower dose.   Chest pain - ez neg MI, EF normal by echo, no WMA; non-obs dz by cath 2013. Atypical pain (10/10 for many hours without ECG or enzyme elevation, worse w/ movement, deep insp). No further eval planned.   Atrial fibrillation - hx PAF, telemetry reviewed and all SR, MD advise on BB, not anticoag candidate due to fall risk, also hx thrombocytopenia (plt 137 on 02/28).    Leg weakness - PT seeing. ESR - 30, aldolase - pending, and CPK 71/4.1.   Hyperkalemia - has been ongoing problem in-hosp. Mgt per IM/Renal, see above  Otherwise, per IM, Renal teams Principal Problem:   Severe bradycardia Active Problems:   Diabetes mellitus   Hypertension   CAD (coronary artery disease)   ESRD on hemodialysis   Hyperkalemia   Chest pain   Atrial fibrillation   Symptomatic bradycardia   Protein-calorie malnutrition, severe   HCAP (healthcare-associated pneumonia)   Signed, Rosaria Ferries , PA-C 8:01 AM 04/07/2013    Patient seen and examined. Agree with assessment and plan. K today 5.7. Pulse 78. Would not resume beta blocker due to rare potential to increase K. If rate control needed, consider cardizem.   Troy Sine, MD, [facility] 04/07/2013 2:02 PM

## 2013-04-07 NOTE — Progress Notes (Signed)
Pt prepared for d/c to [facility] on 5700). IV left in place. Patient has complained of itching and has scratch marks all over his body. Vitals are stable. Report called to receiving facility. Pt to be transported by RN and NT to 5700.  Jillyn Ledger, MBA, BS, RN

## 2013-04-07 NOTE — Progress Notes (Signed)
Physical Therapy Treatment Patient Details Name: Joe Vincent MRN: [id number] DOB: 11/10/1960 Today's Date: 04/07/2013 Time: 1191-4782 PT Time Calculation (min): 12 min  PT Assessment / Plan / Recommendation  History of Present Illness Joe Vincent is a 53 y.o. male presenting on 04/01/2013 with history of diabetes, Hep C, ESRD on hemodialysis, COPD history of colon cancer status post ileostomy and lung cancer presented to the ER for chest pain, shortness of breath and chills.  Admitted to [facility] in 12/14 with septic shock and Acute resp failure with e coli PNA. Had an episode of A-fib with RVR (CHADS VASc2 was 1)  He missed the hemodialysis on Thursday due to snow. Patient was rescheduled to have hemodialysis on 2/27 - he woke up with 10/10 chest pain with shortness of breath nausea and dizziness.   PT Comments   Patient progressing with ambulation.  Appropriate for d/c plan taking place this afternoon to [facility].  Will benefit from continued skilled PT in next venue of care.  Follow Up Recommendations  LTACH     Does the patient have the potential to tolerate intense rehabilitation   N/A  Barriers to Discharge  None      Equipment Recommendations  Wheelchair (measurements PT);Wheelchair cushion (measurements PT)    Recommendations for Other Services  None  Frequency Min 3X/week   Progress towards PT Goals Progress towards PT goals: Progressing toward goals  Plan Discharge plan needs to be updated    Precautions / Restrictions Precautions Precautions: Fall   Pertinent Vitals/Pain Min c/o pain various areas of blistering on skin    Mobility  Bed Mobility Overal bed mobility: Needs Assistance Bed Mobility: Supine to Sit Supine to sit: Min guard General bed mobility comments: for lifting trunk Transfers Overall transfer level: Needs assistance Transfers: Sit to/from Stand Sit to Stand: Mod assist;From elevated surface General transfer comment: assist with bed elevated and cues for  locking knees in standing Ambulation/Gait Ambulation/Gait assistance: Mod assist Ambulation Distance (Feet): 20 Feet Assistive device: Rolling walker (2 wheeled) Gait Pattern/deviations: Step-through pattern General Gait Details: heavy UE assist and needed assist to progress walker and assist for safety due to LE weakness      PT Goals (current goals can now be found in the care plan section)    Visit Information  Last PT Received On: 04/07/13 Assistance Needed: +1 History of Present Illness: Joe Vincent is a 53 y.o. male presenting on 04/01/2013 with history of diabetes, Hep C, ESRD on hemodialysis, COPD history of colon cancer status post ileostomy and lung cancer presented to the ER for chest pain, shortness of breath and chills.  Admitted to [facility] in 12/14 with septic shock and Acute resp failure with e coli PNA. Had an episode of A-fib with RVR (CHADS VASc2 was 1)  He missed the hemodialysis on Thursday due to snow. Patient was rescheduled to have hemodialysis on 2/27 - he woke up with 10/10 chest pain with shortness of breath nausea and dizziness.    Subjective Data      Cognition  Cognition Arousal/Alertness: Awake/alert Behavior During Therapy: WFL for tasks assessed/performed Overall Cognitive Status: Within Functional Limits for tasks assessed    Balance  Balance Overall balance assessment: Needs assistance Postural control: Left lateral lean Standing balance-Leahy Scale: Poor Standing balance comment: UE assist needed due to LE weakness  End of Session PT - End of Session Equipment Utilized During Treatment: Gait belt Activity Tolerance: Patient limited by fatigue Patient left: Other (comment) (in transport chair for  d/c to [facility])   GP     Zalayah Pizzuto,CYNDI 04/07/2013, 4:39 PM Magda Kiel, [place] 04/07/2013

## 2013-04-07 NOTE — Discharge Summary (Signed)
Physician Discharge Summary  Joe Vincent GPQ:[id number] DOB: 10-04-1960 DOA: 04/01/2013  PCP: Curlene Labrum, MD  Admit date: 04/01/2013 Discharge date: 04/07/2013  Recommendations for Outpatient Follow-up:  1. Pt will need to follow up with PCP in 2 weeks post discharge 2. Patient is being transferred to [facility]   Discharge Diagnoses:  Principal Problem:   Severe bradycardia Active Problems:   Diabetes mellitus   Hypertension   CAD (coronary artery disease)   ESRD on hemodialysis   Hyperkalemia   Chest pain   Atrial fibrillation   Symptomatic bradycardia   Protein-calorie malnutrition, severe   HCAP (healthcare-associated pneumonia) Severe bradycardia- junctional rhythm- h/o Atrial fibrillation  -symptomatic in the setting of severe hyperkalemia, beta blocker, recent history of rapid atrial fibrillation  -improved with HD for hyperkalemia  -EP following  -Echo: EF 55-60%  -Cardiology was consulted. Metoprolol tartrate was discontinued. The patient's heart rate improved. The patient's blood pressure as well as his hyperkalemia limited restarting the patient's beta blocker. Cardiology recommended observation off of any chronotropic agents at this time -Cardiology is happy to revisit the patient is the patient redevelops tachycardia Chest pressure  -CTA negative for PE---Although previously non FDG avid, there are progressively  enlarging spiculated masslike lesions in the right lung  -Troponins negative x3  Leukocytosis/HCAP  -04/06/2013--patient developed temperature 100.31F with increased WBC. -04/05/2013 blood cultures negative  -Chest x-ray suggests new right upper lobe infiltrate  -start empiric vanco and cefepime on 04/06/2013 with improvement of WBC -plan 7 days of IV abx--may be able to d/c vanco if blood culture remains ng Hyperkalemia  -remains mildly high without clear etiology although suspect a degree of exogenous intake from outside food -CK 71   -ESR 30  -Nephrology continued to follow the patient and did not feel that recirculation during dialysis was an issue contributing to hyperkalemia -pt remained on tele Lower extremity weakness  -MRI L-spine in pt with lung cancer and back pain  -This has been ordered but has not yet been performed  -Please ensure that the patient's MRI of the L-spine is performed once he is transferred to [place]  -continue PT Spiculated mass - h/o lung CA in 2003 treated with chemo and radiation  -left lung apex - stable  -right lung base- enlarging  -nodule in middle lobe- enlarging  -Previous hospitalist discussed with Dr [name] (pulmonologist)  -Dr. [name] was consulted on 04/03/13 and recommended outpatient PET scan and pending those results the patient may need needle aspiration -Patient will need an outpatient PET scan at discharge  History of paroxysmal fibrillation -The patient has remained in sinus rhythm without any tachycardia -Discussed with cardiology as noted above Thrombocytopenia  -chronic--stable -Monitor CBC  Severe peripheral neuropathy  -has had work up as outpt including MRIs and f/u with neurology (per patient)  -Vitamin B12 675  -PT consulted for evaluation and treatment  Hep C  -on no medications  Diabetes mellitus  -Continue ISS, lantus 10 units, and CBG monitoring  -04/01/13 Hemoglobin A1c 8.9  Hypertension  -Was low, hypertensive medications held  -Currently normatensive--the patient is to remain off of all his antihypertensive medications at this time  -Will montior  ESRD on hemodialysis  -Nephrology consulted and following  -Continue renavit  -Patient underwent Right arm fistulogram with venoplasty of right innominant vein by Dr. Bridgett Larsson on 04/06/13--the patient had some improvement in his right upper extremity edema--he has an outpatient followup with Dr. Bridgett Larsson on 05/20/2013 at 8:30 AM  Code Status: DNR  Family Communication: None at bedside.  Disposition Plan:  LTAC   Discharge Condition: stable  Disposition: LTAC  Diet: renal carb modifited with 1200 cc fluid restrict Wt Readings from Last 3 Encounters:  04/07/13 68.1 kg (150 lb 2.1 oz)  04/07/13 68.1 kg (150 lb 2.1 oz)  04/07/13 68.1 kg (150 lb 2.1 oz)    History of present illness:  53 year old male with history of diabetes, hypertension, ESRD on hemodialysis, TTS, history of colon cancer status post ileostomy presented to the ER for chest pain, shortness of breath and chills. History was obtained from the patient during his emergent hemodialysis. Patient reported that he was scheduled for hemodialysis TTS in [place], however he missed the hemodialysis on Thursday 2/26 due to snow. Patient was rescheduled to have hemodialysis on day of admission; however, he woke up with 10/10 chest pain with shortness of breath nausea or dizziness and did not feel that he could make it to the hemodialysis center. Patient's friend called EMS and patient was brought to the ER.  Patient complaint of having generalized weakness for about 6 months and chest pain for at least a month however forced today with sensation of "something sitting on my chest". Patient was given one nitroglycerin in route, patient was noticed to have severe bradycardia with his heart rate in 20s and in junctional rhythm. He is on beta blocker outpatient.  Labs in the ER showed a potassium >7.7, BUN 87, creatinine 9.16 magnesium 1.4. EKG showed heart rate of 36.  Patient was treated with calcium, insulin, bicarbonate, albuterol, D50 with improvement in his cardiac rhythm in the ER. Nephrology was consulted for emergent hemodialysis cardiology was also consulted by EDP     Consultants: Vascular surgery Pulmonary--[facility]  Discharge Exam: Filed Vitals:   04/07/13 1201  BP: 108/59  Pulse: 87  Temp: 98.2 F (36.8 C)  Resp: 20   Filed Vitals:   04/07/13 1030 04/07/13 1100 04/07/13 1115 04/07/13 1201  BP: 98/56 97/53 102/54  108/59  Pulse: 81 81 81 87  Temp:   98.4 F (36.9 C) 98.2 F (36.8 C)  TempSrc:   Oral Oral  Resp:   20 20  Height:      Weight:   68.1 kg (150 lb 2.1 oz)   SpO2:   100% 100%   General: A&O x 3, NAD, pleasant, cooperative Cardiovascular: RRR, no rub, no gallop, no S3 Respiratory: Scattered bilateral rales, right greater than left. No wheezing. Good air movement.  Abdomen:soft, nontender, nondistended, positive bowel sounds Extremities: No edema, No lymphangitis, no petechiae  Discharge Instructions      Discharge Orders   Future Appointments Provider Department Dept Phone   05/20/2013 8:30 AM Conrad Kell, MD [facility] [phone]   Future Orders Complete By Expires   Increase activity slowly  As directed        Medication List    STOP taking these medications       ciprofloxacin 500 MG tablet  Commonly known as:  CIPRO     metoprolol tartrate 25 MG tablet  Commonly known as:  LOPRESSOR     oxyCODONE-acetaminophen 5-325 MG per tablet  Commonly known as:  PERCOCET/ROXICET     penicillin v potassium 500 MG tablet  Commonly known as:  VEETID      TAKE these medications       b complex-vitamin c-folic acid 0.8 MG Tabs tablet  Take 1 tablet by mouth at bedtime.  camphor-menthol lotion  Commonly known as:  SARNA  Apply topically as needed for itching.     dextrose 5 % SOLN 50 mL with ceFEPIme 1 G SOLR 1 g  Inject 1 g into the vein daily. For 6 additional days     feeding supplement (NEPRO CARB STEADY) Liqd  Take 237 mLs by mouth 3 (three) times daily with meals.     folic acid 1 MG tablet  Commonly known as:  FOLVITE  Take 1 mg by mouth daily.     gabapentin 400 MG capsule  Commonly known as:  NEURONTIN  Take 1 capsule (400 mg total) by mouth 2 (two) times daily.     HYDROcodone-acetaminophen 5-325 MG per tablet  Commonly known as:  NORCO/VICODIN  Take 1 tablet by mouth every 6 (six) hours as needed for moderate pain.      ibuprofen 800 MG tablet  Commonly known as:  ADVIL,MOTRIN  Take 800 mg by mouth every 8 (eight) hours as needed for mild pain.     insulin aspart 100 UNIT/ML injection  Commonly known as:  novoLOG  Inject 4-12 Units into the skin 3 (three) times daily as needed for high blood sugar (Uses a sliding scale at home).     insulin glargine 100 UNIT/ML injection  Commonly known as:  LANTUS  Inject 10 Units into the skin at bedtime.     ipratropium-albuterol 0.5-2.5 (3) MG/3ML Soln  Commonly known as:  DUONEB  Take 3 mLs by nebulization every 6 (six) hours as needed.     midodrine 10 MG tablet  Commonly known as:  PROAMATINE  Take 10 mg by mouth daily.     morphine 80 MG 24 hr capsule  Commonly known as:  KADIAN  Take 80 mg by mouth daily as needed for pain.     naproxen 500 MG tablet  Commonly known as:  NAPROSYN  Take 500 mg by mouth 2 (two) times daily with a meal.     oxycodone 30 MG immediate release tablet  Commonly known as:  ROXICODONE  Take 30 mg by mouth every 8 (eight) hours as needed for pain.     pantoprazole 40 MG tablet  Commonly known as:  PROTONIX  Take 40 mg by mouth daily.     SPIRIVA HANDIHALER 18 MCG inhalation capsule  Generic drug:  tiotropium  Place 18 mcg into inhaler and inhale daily.     Vancomycin 750 MG/150ML Soln  Commonly known as:  VANCOCIN  Inject 150 mLs (750 mg total) into the vein Every Tuesday,Thursday,and Saturday with dialysis. Stop date on 3/11/5         The results of significant diagnostics from this hospitalization (including imaging, microbiology, ancillary and laboratory) are listed below for reference.    Significant Diagnostic Studies: Dg Chest 2 View  04/05/2013   CLINICAL DATA:  Leukocytosis and dialysis patient  EXAM: CHEST  2 VIEW  COMPARISON:  CT ANGIO CHEST W/CM &/OR WO/CM dated 04/02/2013; DG CHEST 1V PORT dated 04/01/2013  FINDINGS: Since the previous chest x-ray there have developed confluent alveolar densities  throughout much of the right lung. This is predominantly in the upper lobe. There is a tiny right pleural effusion. The left lung is well-expanded and largely clear. There is density overlying the posterior aspects of the fourth and fifth ribs on the left which may reflect a confluence of densities but also could reflect early pneumonia. The cardiac silhouette is normal in size. The pulmonary vascularity is  not engorged. The dual-lumen large caliber dialysis type catheter appears unchanged.  IMPRESSION: 1. The findings are consistent with progressive pneumonia predominantly in the right upper lobe. A tiny right pleural effusion is suspected. These findings are new since the previous studies. 2. There is parenchymal density projecting in the left upper lobe which may be artifactual, but developing pneumonia here is not excluded. 3. There is no evidence of CHF.   Electronically Signed   By: Kunio Cummiskey  [place]   On: 04/05/2013 15:13   Ct Angio Chest Pe W/cm &/or Wo Cm  04/02/2013   CLINICAL DATA:  Atypical chest pain, for 1 month and worse today with pleuritic pain  EXAM: CT ANGIOGRAPHY CHEST WITH CONTRAST  TECHNIQUE: Multidetector CT imaging of the chest was performed using the standard protocol during bolus administration of intravenous contrast. Multiplanar CT image reconstructions and MIPs were obtained to evaluate the vascular anatomy.  CONTRAST:  22m OMNIPAQUE IOHEXOL 350 MG/ML SOLN  COMPARISON:  DG CHEST 1V PORT dated 04/01/2013; CT ANGIO CHEST dated 07/03/2012  FINDINGS: Thoracic aorta is not dilated. There are no filling defects in the pulmonary arterial system. There is no pericardial or pleural effusion. There is coronary arterial calcification. There are numerous small calcified lymph nodes within the mediastinum suggesting prior granulomatous exposure. There is an 8 mm calcified left upper lobe lung nodule consistent with a granuloma. Scans through the upper abdomen are unremarkable. There are no acute  musculoskeletal findings.  Spiculated scarring in the left lung apex is stable. There is spiculated nodular density based against the posterior pleural surface in the right lung base. Previously it measured 11 x 14 mm, and currently it measures 13 x 19 mm. It was previously shown to be non tracer avid on PET scan. There is a bilobed nodular opacity in the anterior inferior right middle lobe. Components of this measure 17 x 9 and 13 x 14 mm as compared to 7 x 10 and 7 x 12 mm previously.  Review of the MIP images confirms the above findings.  IMPRESSION: 1. No evidence of pulmonary embolism 2. Although previously non FDG avid, there are progressively enlarging spiculated masslike lesions in the right lung as described above. These may represent progressive fibrotic change related to prior granulomatous exposure but neoplasm is not excluded. Percutaneous biopsy or PET-CT scan suggested. These results will be called to the ordering clinician or representative by the Radiologist Assistant, and communication documented in the PACS Dashboard.   Electronically Signed   By: RSkipper ClicheM.D.   On: 04/02/2013 09:18   Dg Chest Port 1 View  04/01/2013   CLINICAL DATA:  Smoker.  Chest pain.  EXAM: PORTABLE CHEST - 1 VIEW  COMPARISON:  DG CHEST 2 VIEW dated 08/17/2012  FINDINGS: Large bore dual-lumen catheter noted with tip projected over right atrium. Lungs are clear. No pleural effusion or pneumothorax. Heart size normal. Pacing pads noted over the chest.  IMPRESSION: 1. No acute cardiopulmonary disease. 2. Dialysis catheter noted in good anatomic position.   Electronically Signed   By: TMarcello Moores Register   On: 04/01/2013 13:30     Microbiology: Recent Results (from the past 240 hour(s))  MRSA PCR SCREENING     Status: None   Collection Time    04/01/13 10:23 PM      Result Value Ref Range Status   MRSA by PCR NEGATIVE  NEGATIVE Final   Comment:            The GeneXpert MRSA  Assay (FDA     approved for NASAL  specimens     only), is one component of a     comprehensive MRSA colonization     surveillance program. It is not     intended to diagnose MRSA     infection nor to guide or     monitor treatment for     MRSA infections.  CULTURE, BLOOD (ROUTINE X 2)     Status: None   Collection Time    04/05/13  8:05 AM      Result Value Ref Range Status   Specimen Description BLOOD HEMODIALYSIS CATHETER   Final   Special Requests BOTTLES DRAWN AEROBIC AND ANAEROBIC 10CC   Final   Culture  Setup Time     Final   Value: 04/05/2013 12:36     Performed at [facility]   Culture     Final   Value:        BLOOD CULTURE RECEIVED NO GROWTH TO DATE CULTURE WILL BE HELD FOR 5 DAYS BEFORE ISSUING A FINAL NEGATIVE REPORT     Performed at [facility]   Report Status PENDING   Incomplete  CULTURE, BLOOD (ROUTINE X 2)     Status: None   Collection Time    04/05/13  8:15 AM      Result Value Ref Range Status   Specimen Description BLOOD HEMODIALYSIS CATHETER   Final   Special Requests BOTTLES DRAWN AEROBIC AND ANAEROBIC 10CC   Final   Culture  Setup Time     Final   Value: 04/05/2013 12:37     Performed at [facility]   Culture     Final   Value:        BLOOD CULTURE RECEIVED NO GROWTH TO DATE CULTURE WILL BE HELD FOR 5 DAYS BEFORE ISSUING A FINAL NEGATIVE REPORT     Performed at [facility]   Report Status PENDING   Incomplete  CLOSTRIDIUM DIFFICILE BY PCR     Status: None   Collection Time    04/06/13  3:40 PM      Result Value Ref Range Status   C difficile by pcr NEGATIVE  NEGATIVE Final     Labs: Basic Metabolic Panel:  Recent Labs Lab 04/01/13 1316  04/02/13 0300 04/04/13 0410  04/05/13 0446 04/06/13 0539 04/07/13 0500  NA  --   < > 134* 126*  --  128* 134* 127*  K  --   < > 5.0 6.3*  < > 6.6* 5.4* 5.7*  CL  --   < > 91* 87*  --  90* 93* 89*  CO2  --   < > 22 25  --  18* 24 19  GLUCOSE  --   < > 119* 133*  --  189* 131* 194*  BUN  --   < > 27* 38*   --  50* 24* 34*  CREATININE  --   < > 4.59* 5.72*  --  7.46* 5.11* 6.30*  CALCIUM  --   < > 8.4 8.2*  --  8.4 8.5 8.2*  MG 1.4*  --   --   --   --   --   --   --   < > = values in this interval not displayed. Liver Function Tests:  Recent Labs Lab 04/01/13 1433  AST 28  ALT 34  ALKPHOS 201*  BILITOT <0.2*  PROT 7.8  ALBUMIN 3.3*   No  results found for this basename: LIPASE, AMYLASE,  in the last 168 hours No results found for this basename: AMMONIA,  in the last 168 hours CBC:  Recent Labs Lab 04/01/13 1433 04/01/13 2250 04/02/13 0300 04/05/13 0446 04/07/13 0500  WBC 10.1 9.4 8.7 14.2* 11.0*  NEUTROABS 7.6  --   --   --   --   HGB 13.5 12.5* 11.5* 11.8* 10.7*  HCT 40.7 36.8* 35.3* 35.9* 33.8*  MCV 94.2 92.5 94.6 95.5 96.3  PLT 157 138* 137* 112* 162   Cardiac Enzymes:  Recent Labs Lab 04/01/13 1635 04/01/13 2223 04/01/13 2250 04/02/13 0300 04/02/13 1205 04/05/13 1622  CKTOTAL  --   --   --   --   --  71  CKMB  --   --   --   --   --  4.1*  TROPONINI <0.30 <0.30 <0.30 <0.30 <0.30  --    BNP: No components found with this basename: POCBNP,  CBG:  Recent Labs Lab 04/06/13 1006 04/06/13 1113 04/06/13 1719 04/06/13 2139 04/07/13 1159  GLUCAP 171* 182* 251* 138* 117*    Time coordinating discharge:  Greater than 30 minutes  Signed:  Tyaira Heward, DO [facility] Pager: [phone] 04/07/2013, 2:10 PM

## 2013-04-07 NOTE — Procedures (Signed)
Tolerating hemodialysis today. Curiously potassium remains elevated. Anneke Cundy C

## 2013-04-07 NOTE — Progress Notes (Signed)
   Daily Progress Note  Assessment/Planning: POD #1 s/p R innominate vein venoplasty   Swelling appears to be improving R arm  I would probably wait another week and then start using the R arm for access.  The sooner the [facility] is removed, the better as it also is obturating the superior vena cava.  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  Available as needed.  Subjective  - 1 Day Post-Op  No complaints.  Objective Filed Vitals:   04/07/13 1030 04/07/13 1100 04/07/13 1115 04/07/13 1201  BP: 98/56 97/53 102/54 108/59  Pulse: 81 81 81 87  Temp:   98.4 F (36.9 C) 98.2 F (36.8 C)  TempSrc:   Oral Oral  Resp:   20 20  Height:      Weight:   150 lb 2.1 oz (68.1 kg)   SpO2:   100% 100%    Intake/Output Summary (Last 24 hours) at 04/07/13 1314 Last data filed at 04/07/13 1115  Gross per 24 hour  Intake    735 ml  Output   3043 ml  Net  -2308 ml    PULM  CTAB CV  RRR GI  soft, NTND VASC  R arm swelling somewhat decreased, palpable thrill and fistula  Laboratory CBC    Component Value Date/Time   WBC 11.0* 04/07/2013 0500   HGB 10.7* 04/07/2013 0500   HCT 33.8* 04/07/2013 0500   PLT 162 04/07/2013 0500    BMET    Component Value Date/Time   NA 127* 04/07/2013 0500   K 5.7* 04/07/2013 0500   CL 89* 04/07/2013 0500   CO2 19 04/07/2013 0500   GLUCOSE 194* 04/07/2013 0500   BUN 34* 04/07/2013 0500   CREATININE 6.30* 04/07/2013 0500   CALCIUM 8.2* 04/07/2013 0500   GFRNONAA 9* 04/07/2013 0500   GFRAA 11* 04/07/2013 0500    Adele Barthel, MD [facility] [place]: [phone] Pager: [phone]  04/07/2013, 1:14 PM

## 2013-04-08 ENCOUNTER — Other Ambulatory Visit (HOSPITAL_COMMUNITY): Payer: Self-pay

## 2013-04-08 ENCOUNTER — Other Ambulatory Visit: Payer: Self-pay

## 2013-04-08 LAB — LIPID PANEL
CHOL/HDL RATIO: 3.5 ratio
Cholesterol: 121 mg/dL (ref 0–200)
HDL: 35 mg/dL — ABNORMAL LOW (ref >39)
LDL Cholesterol: 56 mg/dL (ref 0–99)
Triglycerides: 150 mg/dL — ABNORMAL HIGH (ref <150)
VLDL: 30 mg/dL (ref 0–40)

## 2013-04-08 LAB — CBC WITH DIFFERENTIAL/PLATELET
Basophils Absolute: 0 10*3/uL (ref 0.0–0.1)
Basophils Relative: 0 % (ref 0–1)
Eosinophils Absolute: 0.1 10*3/uL (ref 0.0–0.7)
Eosinophils Relative: 1 % (ref 0–5)
HCT: 33.8 % — ABNORMAL LOW (ref 39.0–52.0)
HEMOGLOBIN: 10.7 g/dL — AB (ref 13.0–17.0)
LYMPHS ABS: 0.7 10*3/uL (ref 0.7–4.0)
LYMPHS PCT: 7 % — AB (ref 12–46)
MCH: 30.4 pg (ref 26.0–34.0)
MCHC: 31.7 g/dL (ref 30.0–36.0)
MCV: 96 fL (ref 78.0–100.0)
MONO ABS: 1 10*3/uL (ref 0.1–1.0)
Monocytes Relative: 11 % (ref 3–12)
NEUTROS ABS: 7.4 10*3/uL (ref 1.7–7.7)
NEUTROS PCT: 80 % — AB (ref 43–77)
Platelets: 171 10*3/uL (ref 150–400)
RBC: 3.52 MIL/uL — AB (ref 4.22–5.81)
RDW: 15.3 % (ref 11.5–15.5)
WBC: 9.2 10*3/uL (ref 4.0–10.5)

## 2013-04-08 LAB — COMPREHENSIVE METABOLIC PANEL
ALBUMIN: 2.6 g/dL — AB (ref 3.5–5.2)
ALK PHOS: 197 U/L — AB (ref 39–117)
ALT: 13 U/L (ref 0–53)
AST: 29 U/L (ref 0–37)
BUN: 14 mg/dL (ref 6–23)
CHLORIDE: 92 meq/L — AB (ref 96–112)
CO2: 24 meq/L (ref 19–32)
Calcium: 8.2 mg/dL — ABNORMAL LOW (ref 8.4–10.5)
Creatinine, Ser: 3.98 mg/dL — ABNORMAL HIGH (ref 0.50–1.35)
GFR, EST AFRICAN AMERICAN: 18 mL/min — AB (ref >90)
GFR, EST NON AFRICAN AMERICAN: 16 mL/min — AB (ref >90)
GLUCOSE: 120 mg/dL — AB (ref 70–99)
POTASSIUM: 4.5 meq/L (ref 3.7–5.3)
Sodium: 131 mEq/L — ABNORMAL LOW (ref 137–147)
Total Protein: 6.8 g/dL (ref 6.0–8.3)

## 2013-04-08 LAB — T4, FREE: Free T4: 0.95 ng/dL (ref 0.80–1.80)

## 2013-04-08 LAB — CK TOTAL AND CKMB (NOT AT ARMC)
CK, MB: 2.4 ng/mL (ref 0.3–4.0)
Relative Index: INVALID (ref 0.0–2.5)
Total CK: 24 U/L (ref 7–232)

## 2013-04-08 LAB — HEMOGLOBIN A1C
Hgb A1c MFr Bld: 8.7 % — ABNORMAL HIGH (ref <5.7)
MEAN PLASMA GLUCOSE: 203 mg/dL — AB (ref <117)

## 2013-04-08 LAB — MAGNESIUM: MAGNESIUM: 2 mg/dL (ref 1.5–2.5)

## 2013-04-08 LAB — APTT: aPTT: 39 seconds — ABNORMAL HIGH (ref 24–37)

## 2013-04-08 LAB — PREALBUMIN: PREALBUMIN: 13.9 mg/dL — AB (ref 17.0–34.0)

## 2013-04-08 LAB — HEPATITIS B SURFACE ANTIBODY,QUALITATIVE: Hep B S Ab: NEGATIVE

## 2013-04-08 LAB — TSH: TSH: 1.144 u[IU]/mL (ref 0.350–4.500)

## 2013-04-08 LAB — HEPATITIS B SURFACE ANTIGEN: Hepatitis B Surface Ag: NEGATIVE

## 2013-04-08 LAB — VITAMIN B12: VITAMIN B 12: 1006 pg/mL — AB (ref 211–911)

## 2013-04-08 LAB — PROTIME-INR
INR: 1.06 (ref 0.00–1.49)
Prothrombin Time: 13.6 seconds (ref 11.6–15.2)

## 2013-04-08 LAB — TROPONIN I: Troponin I: <0.3 ng/mL (ref <0.30)

## 2013-04-08 LAB — PHOSPHORUS: Phosphorus: 3.9 mg/dL (ref 2.3–4.6)

## 2013-04-08 LAB — ALDOLASE: Aldolase: 10.3 U/L — ABNORMAL HIGH (ref <=8.1)

## 2013-04-09 ENCOUNTER — Other Ambulatory Visit (HOSPITAL_COMMUNITY): Payer: Medicare Other

## 2013-04-10 ENCOUNTER — Other Ambulatory Visit: Payer: Medicare Other

## 2013-04-10 LAB — BASIC METABOLIC PANEL
BUN: 29 mg/dL — AB (ref 6–23)
CHLORIDE: 96 meq/L (ref 96–112)
CO2: 24 mEq/L (ref 19–32)
CREATININE: 4.68 mg/dL — AB (ref 0.50–1.35)
Calcium: 8.4 mg/dL (ref 8.4–10.5)
GFR calc Af Amer: 15 mL/min — ABNORMAL LOW (ref >90)
GFR calc non Af Amer: 13 mL/min — ABNORMAL LOW (ref >90)
Glucose, Bld: 148 mg/dL — ABNORMAL HIGH (ref 70–99)
Potassium: 6.3 mEq/L — ABNORMAL HIGH (ref 3.7–5.3)
Sodium: 134 mEq/L — ABNORMAL LOW (ref 137–147)

## 2013-04-10 LAB — FOLATE RBC: RBC Folate: 954 ng/mL — ABNORMAL HIGH (ref >280)

## 2013-04-10 LAB — POTASSIUM: Potassium: 6.2 mEq/L — ABNORMAL HIGH (ref 3.7–5.3)

## 2013-04-11 LAB — CULTURE, BLOOD (ROUTINE X 2)
CULTURE: NO GROWTH
Culture: NO GROWTH

## 2013-04-11 LAB — CBC
HEMATOCRIT: 34.3 % — AB (ref 39.0–52.0)
Hemoglobin: 11 g/dL — ABNORMAL LOW (ref 13.0–17.0)
MCH: 30.7 pg (ref 26.0–34.0)
MCHC: 32.1 g/dL (ref 30.0–36.0)
MCV: 95.8 fL (ref 78.0–100.0)
Platelets: 202 10*3/uL (ref 150–400)
RBC: 3.58 MIL/uL — AB (ref 4.22–5.81)
RDW: 15.5 % (ref 11.5–15.5)
WBC: 6.6 10*3/uL (ref 4.0–10.5)

## 2013-04-11 LAB — RENAL FUNCTION PANEL
Albumin: 2.6 g/dL — ABNORMAL LOW (ref 3.5–5.2)
BUN: 49 mg/dL — AB (ref 6–23)
CO2: 23 mEq/L (ref 19–32)
CREATININE: 6.11 mg/dL — AB (ref 0.50–1.35)
Calcium: 8.4 mg/dL (ref 8.4–10.5)
Chloride: 100 mEq/L (ref 96–112)
GFR calc Af Amer: 11 mL/min — ABNORMAL LOW (ref >90)
GFR calc non Af Amer: 9 mL/min — ABNORMAL LOW (ref >90)
GLUCOSE: 160 mg/dL — AB (ref 70–99)
PHOSPHORUS: 3.1 mg/dL (ref 2.3–4.6)
POTASSIUM: 7 meq/L — AB (ref 3.7–5.3)
Sodium: 138 mEq/L (ref 137–147)

## 2013-04-11 LAB — VANCOMYCIN, TROUGH: Vancomycin Tr: 15.8 ug/mL (ref 10.0–20.0)

## 2013-04-12 ENCOUNTER — Encounter: Payer: Self-pay | Admitting: Vascular Surgery

## 2013-04-12 LAB — POTASSIUM
POTASSIUM: 6.8 meq/L — AB (ref 3.7–5.3)
Potassium: 6.4 mEq/L — ABNORMAL HIGH (ref 3.7–5.3)

## 2013-04-13 LAB — RENAL FUNCTION PANEL
Albumin: 2.6 g/dL — ABNORMAL LOW (ref 3.5–5.2)
BUN: 53 mg/dL — ABNORMAL HIGH (ref 6–23)
CO2: 20 meq/L (ref 19–32)
Calcium: 8.4 mg/dL (ref 8.4–10.5)
Chloride: 92 mEq/L — ABNORMAL LOW (ref 96–112)
Creatinine, Ser: 5.56 mg/dL — ABNORMAL HIGH (ref 0.50–1.35)
GFR calc Af Amer: 12 mL/min — ABNORMAL LOW (ref >90)
GFR calc non Af Amer: 11 mL/min — ABNORMAL LOW (ref >90)
GLUCOSE: 230 mg/dL — AB (ref 70–99)
POTASSIUM: 7.5 meq/L — AB (ref 3.7–5.3)
Phosphorus: 4.4 mg/dL (ref 2.3–4.6)
SODIUM: 127 meq/L — AB (ref 137–147)

## 2013-04-13 LAB — CBC
HCT: 34 % — ABNORMAL LOW (ref 39.0–52.0)
Hemoglobin: 10.7 g/dL — ABNORMAL LOW (ref 13.0–17.0)
MCH: 30.3 pg (ref 26.0–34.0)
MCHC: 31.5 g/dL (ref 30.0–36.0)
MCV: 96.3 fL (ref 78.0–100.0)
Platelets: 208 10*3/uL (ref 150–400)
RBC: 3.53 MIL/uL — AB (ref 4.22–5.81)
RDW: 15.7 % — ABNORMAL HIGH (ref 11.5–15.5)
WBC: 9 10*3/uL (ref 4.0–10.5)

## 2013-04-13 LAB — VANCOMYCIN, TROUGH: Vancomycin Tr: 18.3 ug/mL (ref 10.0–20.0)

## 2013-04-13 LAB — POTASSIUM: POTASSIUM: 5.1 meq/L (ref 3.7–5.3)

## 2013-04-26 ENCOUNTER — Emergency Department (HOSPITAL_COMMUNITY): Payer: Medicare Other

## 2013-04-26 ENCOUNTER — Inpatient Hospital Stay (HOSPITAL_COMMUNITY)
Admission: EM | Admit: 2013-04-26 | Discharge: 2013-04-29 | DRG: 640 | Disposition: A | Discharge status: Home / Self Care | Payer: Medicare Other | Attending: Internal Medicine | Admitting: Internal Medicine

## 2013-04-26 ENCOUNTER — Encounter (HOSPITAL_COMMUNITY): Payer: Self-pay | Admitting: Emergency Medicine

## 2013-04-26 DIAGNOSIS — R001 Bradycardia, unspecified: Secondary | ICD-10-CM

## 2013-04-26 DIAGNOSIS — F332 Major depressive disorder, recurrent severe without psychotic features: Secondary | ICD-10-CM

## 2013-04-26 DIAGNOSIS — F411 Generalized anxiety disorder: Secondary | ICD-10-CM | POA: Diagnosis present

## 2013-04-26 DIAGNOSIS — Z720 Tobacco use: Secondary | ICD-10-CM

## 2013-04-26 DIAGNOSIS — I4891 Unspecified atrial fibrillation: Secondary | ICD-10-CM

## 2013-04-26 DIAGNOSIS — Z933 Colostomy status: Secondary | ICD-10-CM

## 2013-04-26 DIAGNOSIS — X838XXA Intentional self-harm by other specified means, initial encounter: Secondary | ICD-10-CM

## 2013-04-26 DIAGNOSIS — I252 Old myocardial infarction: Secondary | ICD-10-CM

## 2013-04-26 DIAGNOSIS — R45851 Suicidal ideations: Secondary | ICD-10-CM

## 2013-04-26 DIAGNOSIS — J189 Pneumonia, unspecified organism: Secondary | ICD-10-CM

## 2013-04-26 DIAGNOSIS — I120 Hypertensive chronic kidney disease with stage 5 chronic kidney disease or end stage renal disease: Secondary | ICD-10-CM | POA: Diagnosis present

## 2013-04-26 DIAGNOSIS — G609 Hereditary and idiopathic neuropathy, unspecified: Secondary | ICD-10-CM | POA: Diagnosis present

## 2013-04-26 DIAGNOSIS — J90 Pleural effusion, not elsewhere classified: Secondary | ICD-10-CM | POA: Diagnosis present

## 2013-04-26 DIAGNOSIS — Z85038 Personal history of other malignant neoplasm of large intestine: Secondary | ICD-10-CM

## 2013-04-26 DIAGNOSIS — E875 Hyperkalemia: Principal | ICD-10-CM | POA: Diagnosis present

## 2013-04-26 DIAGNOSIS — J4489 Other specified chronic obstructive pulmonary disease: Secondary | ICD-10-CM | POA: Diagnosis present

## 2013-04-26 DIAGNOSIS — F329 Major depressive disorder, single episode, unspecified: Secondary | ICD-10-CM | POA: Diagnosis present

## 2013-04-26 DIAGNOSIS — T82898A Other specified complication of vascular prosthetic devices, implants and grafts, initial encounter: Secondary | ICD-10-CM

## 2013-04-26 DIAGNOSIS — J449 Chronic obstructive pulmonary disease, unspecified: Secondary | ICD-10-CM | POA: Diagnosis present

## 2013-04-26 DIAGNOSIS — R079 Chest pain, unspecified: Secondary | ICD-10-CM | POA: Diagnosis present

## 2013-04-26 DIAGNOSIS — Z8249 Family history of ischemic heart disease and other diseases of the circulatory system: Secondary | ICD-10-CM

## 2013-04-26 DIAGNOSIS — Z91199 Patient's noncompliance with other medical treatment and regimen due to unspecified reason: Secondary | ICD-10-CM

## 2013-04-26 DIAGNOSIS — I10 Essential (primary) hypertension: Secondary | ICD-10-CM | POA: Diagnosis present

## 2013-04-26 DIAGNOSIS — Z923 Personal history of irradiation: Secondary | ICD-10-CM

## 2013-04-26 DIAGNOSIS — N2581 Secondary hyperparathyroidism of renal origin: Secondary | ICD-10-CM | POA: Diagnosis present

## 2013-04-26 DIAGNOSIS — Z794 Long term (current) use of insulin: Secondary | ICD-10-CM

## 2013-04-26 DIAGNOSIS — N186 End stage renal disease: Secondary | ICD-10-CM | POA: Diagnosis present

## 2013-04-26 DIAGNOSIS — D696 Thrombocytopenia, unspecified: Secondary | ICD-10-CM | POA: Diagnosis present

## 2013-04-26 DIAGNOSIS — Z85118 Personal history of other malignant neoplasm of bronchus and lung: Secondary | ICD-10-CM

## 2013-04-26 DIAGNOSIS — G8929 Other chronic pain: Secondary | ICD-10-CM | POA: Diagnosis present

## 2013-04-26 DIAGNOSIS — F3289 Other specified depressive episodes: Secondary | ICD-10-CM | POA: Diagnosis present

## 2013-04-26 DIAGNOSIS — Z9221 Personal history of antineoplastic chemotherapy: Secondary | ICD-10-CM

## 2013-04-26 DIAGNOSIS — Z992 Dependence on renal dialysis: Secondary | ICD-10-CM

## 2013-04-26 DIAGNOSIS — K219 Gastro-esophageal reflux disease without esophagitis: Secondary | ICD-10-CM | POA: Diagnosis present

## 2013-04-26 DIAGNOSIS — E43 Unspecified severe protein-calorie malnutrition: Secondary | ICD-10-CM

## 2013-04-26 DIAGNOSIS — B1920 Unspecified viral hepatitis C without hepatic coma: Secondary | ICD-10-CM | POA: Diagnosis present

## 2013-04-26 DIAGNOSIS — K2970 Gastritis, unspecified, without bleeding: Secondary | ICD-10-CM

## 2013-04-26 DIAGNOSIS — R222 Localized swelling, mass and lump, trunk: Secondary | ICD-10-CM | POA: Diagnosis present

## 2013-04-26 DIAGNOSIS — Z9119 Patient's noncompliance with other medical treatment and regimen: Secondary | ICD-10-CM

## 2013-04-26 DIAGNOSIS — M1990 Unspecified osteoarthritis, unspecified site: Secondary | ICD-10-CM | POA: Diagnosis present

## 2013-04-26 DIAGNOSIS — H3500 Unspecified background retinopathy: Secondary | ICD-10-CM | POA: Diagnosis present

## 2013-04-26 DIAGNOSIS — E8779 Other fluid overload: Secondary | ICD-10-CM | POA: Diagnosis present

## 2013-04-26 DIAGNOSIS — D649 Anemia, unspecified: Secondary | ICD-10-CM | POA: Diagnosis present

## 2013-04-26 DIAGNOSIS — F17200 Nicotine dependence, unspecified, uncomplicated: Secondary | ICD-10-CM | POA: Diagnosis present

## 2013-04-26 DIAGNOSIS — I2510 Atherosclerotic heart disease of native coronary artery without angina pectoris: Secondary | ICD-10-CM | POA: Diagnosis present

## 2013-04-26 DIAGNOSIS — E119 Type 2 diabetes mellitus without complications: Secondary | ICD-10-CM | POA: Diagnosis present

## 2013-04-26 LAB — BASIC METABOLIC PANEL
BUN: 78 mg/dL — ABNORMAL HIGH (ref 6–23)
BUN: 79 mg/dL — ABNORMAL HIGH (ref 6–23)
CO2: 18 mEq/L — ABNORMAL LOW (ref 19–32)
CO2: 20 mEq/L (ref 19–32)
Calcium: 8.1 mg/dL — ABNORMAL LOW (ref 8.4–10.5)
Calcium: 8.1 mg/dL — ABNORMAL LOW (ref 8.4–10.5)
Chloride: 98 mEq/L (ref 96–112)
Chloride: 101 mEq/L (ref 96–112)
Creatinine, Ser: 9.91 mg/dL — ABNORMAL HIGH (ref 0.50–1.35)
Creatinine, Ser: 10.01 mg/dL — ABNORMAL HIGH (ref 0.50–1.35)
GFR calc Af Amer: 6 mL/min — ABNORMAL LOW (ref >90)
GFR calc Af Amer: 6 mL/min — ABNORMAL LOW (ref >90)
GFR calc non Af Amer: 5 mL/min — ABNORMAL LOW (ref >90)
GFR calc non Af Amer: 5 mL/min — ABNORMAL LOW (ref >90)
Glucose, Bld: 222 mg/dL — ABNORMAL HIGH (ref 70–99)
Glucose, Bld: 176 mg/dL — ABNORMAL HIGH (ref 70–99)
Potassium: >7.7 mEq/L (ref 3.7–5.3)
Potassium: 6.5 mEq/L (ref 3.7–5.3)
Sodium: 135 mEq/L — ABNORMAL LOW (ref 137–147)
Sodium: 142 mEq/L (ref 137–147)

## 2013-04-26 LAB — CBC WITH DIFFERENTIAL/PLATELET
Basophils Absolute: 0.1 10*3/uL (ref 0.0–0.1)
Basophils Relative: 1 % (ref 0–1)
Eosinophils Absolute: 0 10*3/uL (ref 0.0–0.7)
Eosinophils Relative: 1 % (ref 0–5)
HCT: 38.3 % — ABNORMAL LOW (ref 39.0–52.0)
Hemoglobin: 12.2 g/dL — ABNORMAL LOW (ref 13.0–17.0)
Lymphocytes Relative: 20 % (ref 12–46)
Lymphs Abs: 1.5 10*3/uL (ref 0.7–4.0)
MCH: 30.3 pg (ref 26.0–34.0)
MCHC: 31.9 g/dL (ref 30.0–36.0)
MCV: 95.3 fL (ref 78.0–100.0)
Monocytes Absolute: 0.8 10*3/uL (ref 0.1–1.0)
Monocytes Relative: 11 % (ref 3–12)
Neutro Abs: 5 10*3/uL (ref 1.7–7.7)
Neutrophils Relative %: 67 % (ref 43–77)
Platelets: 168 10*3/uL (ref 150–400)
RBC: 4.02 MIL/uL — ABNORMAL LOW (ref 4.22–5.81)
RDW: 15.2 % (ref 11.5–15.5)
WBC: 7.5 10*3/uL (ref 4.0–10.5)

## 2013-04-26 LAB — TROPONIN I
Troponin I: <0.3 ng/mL (ref <0.30)
Troponin I: <0.3 ng/mL (ref <0.30)

## 2013-04-26 LAB — CBG MONITORING, ED: Glucose-Capillary: 132 mg/dL — ABNORMAL HIGH (ref 70–99)

## 2013-04-26 LAB — MAGNESIUM: Magnesium: 2.8 mg/dL — ABNORMAL HIGH (ref 1.5–2.5)

## 2013-04-26 LAB — GLUCOSE, CAPILLARY: Glucose-Capillary: 96 mg/dL (ref 70–99)

## 2013-04-26 LAB — PHOSPHORUS: Phosphorus: 8 mg/dL — ABNORMAL HIGH (ref 2.3–4.6)

## 2013-04-26 MED ORDER — DEXTROSE 50 % IV SOLN
50.0000 mL | Freq: Once | INTRAVENOUS | Status: AC
  Administered 2013-04-26: 50 mL via INTRAVENOUS

## 2013-04-26 MED ORDER — SODIUM POLYSTYRENE SULFONATE 15 GM/60ML PO SUSP
30.0000 g | Freq: Once | ORAL | Status: AC
  Administered 2013-04-26: 30 g via ORAL
  Filled 2013-04-26: qty 120

## 2013-04-26 MED ORDER — VITAMIN B-1 100 MG PO TABS: 100.0000 mg | ORAL_TABLET | Freq: Every day | ORAL | Status: DC

## 2013-04-26 MED ORDER — INSULIN GLARGINE 100 UNIT/ML ~~LOC~~ SOLN
15.0000 [IU] | Freq: Every day | SUBCUTANEOUS | Status: DC
  Administered 2013-04-26 – 2013-04-27 (×2): 15 [IU] via SUBCUTANEOUS
  Filled 2013-04-26 (×2): qty 0.15

## 2013-04-26 MED ORDER — NEPRO/CARBSTEADY PO LIQD
237.0000 mL | Freq: Three times a day (TID) | ORAL | Status: DC
  Administered 2013-04-28 – 2013-04-29 (×2): 237 mL via ORAL
  Filled 2013-04-26: qty 237

## 2013-04-26 MED ORDER — ONDANSETRON HCL 4 MG/2ML IJ SOLN: 4.0000 mg | Freq: Four times a day (QID) | INTRAMUSCULAR | Status: DC | PRN

## 2013-04-26 MED ORDER — ALBUTEROL SULFATE (2.5 MG/3ML) 0.083% IN NEBU
INHALATION_SOLUTION | RESPIRATORY_TRACT | Status: AC
  Filled 2013-04-26: qty 3

## 2013-04-26 MED ORDER — TIOTROPIUM BROMIDE MONOHYDRATE 18 MCG IN CAPS
18.0000 ug | ORAL_CAPSULE | Freq: Every day | RESPIRATORY_TRACT | Status: DC
  Administered 2013-04-27 – 2013-04-29 (×3): 18 ug via RESPIRATORY_TRACT
  Filled 2013-04-26 (×2): qty 5

## 2013-04-26 MED ORDER — PANTOPRAZOLE SODIUM 40 MG PO TBEC
40.0000 mg | DELAYED_RELEASE_TABLET | Freq: Every day | ORAL | Status: DC
  Administered 2013-04-27 – 2013-04-29 (×3): 40 mg via ORAL
  Filled 2013-04-26 (×3): qty 1

## 2013-04-26 MED ORDER — INSULIN ASPART 100 UNIT/ML ~~LOC~~ SOLN: 0.0000 [IU] | Freq: Every day | SUBCUTANEOUS | Status: DC

## 2013-04-26 MED ORDER — ONDANSETRON HCL 4 MG PO TABS: 4.0000 mg | ORAL_TABLET | Freq: Four times a day (QID) | ORAL | Status: DC | PRN

## 2013-04-26 MED ORDER — INSULIN ASPART 100 UNIT/ML ~~LOC~~ SOLN
10.0000 [IU] | Freq: Once | SUBCUTANEOUS | Status: AC
Start: 2013-04-26 — End: 2013-04-26
  Administered 2013-04-26: 10 [IU] via INTRAVENOUS
  Filled 2013-04-26: qty 1

## 2013-04-26 MED ORDER — SODIUM POLYSTYRENE SULFONATE 15 GM/60ML PO SUSP: 30.0000 g | Freq: Once | ORAL | Status: DC

## 2013-04-26 MED ORDER — CALCIUM GLUCONATE 10 % IV SOLN
1.0000 g | Freq: Once | INTRAVENOUS | Status: AC
  Administered 2013-04-26: 1 g via INTRAVENOUS

## 2013-04-26 MED ORDER — HYDROMORPHONE HCL PF 1 MG/ML IJ SOLN
1.0000 mg | Freq: Once | INTRAMUSCULAR | Status: AC
  Administered 2013-04-26: 1 mg via INTRAVENOUS
  Filled 2013-04-26: qty 1

## 2013-04-26 MED ORDER — FOLIC ACID 1 MG PO TABS
1.0000 mg | ORAL_TABLET | Freq: Every day | ORAL | Status: DC
  Administered 2013-04-27 – 2013-04-29 (×3): 1 mg via ORAL
  Filled 2013-04-26 (×3): qty 1

## 2013-04-26 MED ORDER — DEXTROSE 50 % IV SOLN
INTRAVENOUS | Status: AC
  Filled 2013-04-26: qty 50

## 2013-04-26 MED ORDER — INSULIN ASPART 100 UNIT/ML ~~LOC~~ SOLN
0.0000 [IU] | Freq: Three times a day (TID) | SUBCUTANEOUS | Status: DC
  Administered 2013-04-27: 1 [IU] via SUBCUTANEOUS
  Administered 2013-04-28: 2 [IU] via SUBCUTANEOUS
  Administered 2013-04-28: 1 [IU] via SUBCUTANEOUS
  Administered 2013-04-29: 3 [IU] via SUBCUTANEOUS

## 2013-04-26 MED ORDER — IPRATROPIUM-ALBUTEROL 0.5-2.5 (3) MG/3ML IN SOLN: 3.0000 mL | Freq: Four times a day (QID) | RESPIRATORY_TRACT | Status: DC | PRN

## 2013-04-26 MED ORDER — HEPARIN SODIUM (PORCINE) 5000 UNIT/ML IJ SOLN
5000.0000 [IU] | Freq: Three times a day (TID) | INTRAMUSCULAR | Status: DC
  Administered 2013-04-26 – 2013-04-28 (×5): 5000 [IU] via SUBCUTANEOUS
  Filled 2013-04-26 (×6): qty 1

## 2013-04-26 MED ORDER — DEXTROSE 50 % IV SOLN: 25.0000 mL | Freq: Once | INTRAVENOUS | Status: DC

## 2013-04-26 MED ORDER — SODIUM BICARBONATE 8.4 % IV SOLN
100.0000 meq | Freq: Once | INTRAVENOUS | Status: AC
  Administered 2013-04-26: 100 meq via INTRAVENOUS

## 2013-04-26 MED ORDER — SODIUM CHLORIDE 0.9 % IV SOLN
INTRAVENOUS | Status: AC
  Administered 2013-04-26: 1 g via INTRAVENOUS
  Filled 2013-04-26: qty 10

## 2013-04-26 MED ORDER — ALBUTEROL SULFATE (2.5 MG/3ML) 0.083% IN NEBU
5.0000 mg | INHALATION_SOLUTION | Freq: Once | RESPIRATORY_TRACT | Status: AC
  Administered 2013-04-26: 5 mg via RESPIRATORY_TRACT

## 2013-04-26 MED ORDER — INSULIN ASPART 100 UNIT/ML ~~LOC~~ SOLN: 4.0000 [IU] | Freq: Three times a day (TID) | SUBCUTANEOUS | Status: DC | PRN

## 2013-04-26 MED ORDER — SODIUM BICARBONATE 8.4 % IV SOLN
INTRAVENOUS | Status: AC
  Administered 2013-04-26: 100 meq via INTRAVENOUS
  Filled 2013-04-26: qty 100

## 2013-04-26 MED ORDER — VITAMIN B-1 100 MG PO TABS
100.0000 mg | ORAL_TABLET | Freq: Every day | ORAL | Status: DC
  Administered 2013-04-27 – 2013-04-29 (×3): 100 mg via ORAL
  Filled 2013-04-26 (×3): qty 1

## 2013-04-26 MED ORDER — OXYCODONE HCL 5 MG PO TABS
30.0000 mg | ORAL_TABLET | Freq: Three times a day (TID) | ORAL | Status: DC | PRN
  Administered 2013-04-26 – 2013-04-28 (×3): 30 mg via ORAL
  Filled 2013-04-26 (×3): qty 6

## 2013-04-26 NOTE — ED Notes (Signed)
Pt c/o pain all over esp abd

## 2013-04-26 NOTE — H&P (Signed)
[facility] History and Physical  Joe Vincent TOI:[id number] DOB: 08-10-1960 DOA: 04/26/2013  Referring physician: ER. PCP: Curlene Labrum, MD   Chief Complaint: Dyspnea, chest pain, generalized body pain.  HPI: Joe Vincent is a 53 y.o. male  This 54 year old man, who has end-stage renal disease and is on hemodialysis 3 times a week, presents with nonspecific symptoms but mainly include dyspnea and chest pain as well as hurting all over. When he was seen in the emergency room, he was found to have a potassium of 7.7 and chest x-ray was consistent with fluid overload. The emergency room physician has given him medications to help his potassium emergently but clearly this man requires dialysis. The patient has been given sodium bicarbonate, calcium gluconate as well as dextrose. He remains alert and oriented. He feels extremely fatigued.   Review of Systems:  Constitutional:  No weight loss, night sweats, Fevers, chills. HEENT:  No headaches, Difficulty swallowing,Tooth/dental problems,Sore throat,  No sneezing, itching, ear ache, nasal congestion, post nasal drip,  Cardio-vascular:  No Orthopnea, PND, swelling in lower extremities, anasarca, dizziness, palpitations  GI:  No heartburn, indigestion, abdominal pain, nausea, vomiting, diarrhea, change in bowel habits, loss of appetite  Resp:   No excess mucus, no productive cough, No non-productive cough, No coughing up of blood.No change in color of mucus.No wheezing.No chest wall deformity  Skin:  no rash or lesions.  GU:  no dysuria, change in color of urine, no urgency or frequency. No flank pain.  Musculoskeletal:  No joint pain or swelling. No decreased range of motion. No back pain.  Psych:  No change in mood or affect. No depression or anxiety. No memory loss.   Past Medical History  Diagnosis Date  . Diabetes mellitus   . Hypertension   . ESRD on hemodialysis     On HD in [place] [facility] [place] unit.  Nephrologist is  Dr. Hinda Lenis. Gets HD on TTS schedule.   [name] Retinopathy   . Peripheral neuropathy   . Chest pain     a. 05/2011 Cath: nonobs dzs.  . Myocardial infarction 2000  . Depression   . Liver disease   . Cancer     Colon  . GERD (gastroesophageal reflux disease)   . CAD (coronary artery disease)   . OA (osteoarthritis)     Hx: of  . Anxiety   . Esophagitis 2013    and gastritis by EGD   Past Surgical History  Procedure Laterality Date  . Colostomy  Ileostomy    colon cancer  . Amputation      left pinky toe  . Esophagogastroduodenoscopy  05/23/2011    Procedure: ESOPHAGOGASTRODUODENOSCOPY (EGD);  Surgeon: Beryle Beams, MD;  Location: [facility] ENDOSCOPY;  Service: Endoscopy;  Laterality: N/A;  . Colon surgery  2009  . Cardiac catheterization  05/22/2011    [place], LAD OK, D1 80-90% at bifurcation, OM1 40-50%, OM2 50%, RCA irregular. Ao root no dilatation or dissection. Recommendations: I'm not sure how to explain this patient's presentation of severe substernal chest pain with associated vomiting. He does not have an obvious coronary culprit vessel and there is no obvious proximal aortic pathology. He is medically very complex  . Eye surgery    . Av fistula placement Right 03/02/2013    Procedure: INSERTION OF ARTERIOVENOUS (AV) GORE-TEX GRAFT ARM - RIGHT FOREARM VERSUS UPPER ARM;  Surgeon: Mal Misty, MD;  Location: [place];  Service: Vascular;  Laterality: Right;  . Fistulogram Right 04/06/2013  Procedure: FISTULOGRAM-RIGHT ARM WITH BALLOON ANGIOPLASTY;  Surgeon: Conrad Stockton, MD;  Location: [place];  Service: Vascular;  Laterality: Right;   Social History:  reports that he has been smoking Cigarettes.  He has a 17.5 pack-year smoking history. He has quit using smokeless tobacco. He reports that he does not drink alcohol or use illicit drugs.  Allergies  Allergen Reactions  . Codeine Swelling and Rash  . Kadian [Morphine Sulfate Er] Itching  . Prednisone Other (See Comments)    unknown      Family History  Problem Relation Age of Onset  . Cancer Mother   . Heart disease Father   . Cancer Father      Prior to Admission medications   Medication Sig Start Date End Date Taking? Authorizing Provider  folic acid (FOLVITE) 1 MG tablet Take 1 mg by mouth daily.   Yes Historical Provider, MD  insulin aspart (NOVOLOG) 100 UNIT/ML injection Inject 4-12 Units into the skin 3 (three) times daily as needed for high blood sugar (Uses a sliding scale at home).   Yes Historical Provider, MD  insulin glargine (LANTUS) 100 UNIT/ML injection Inject 15 Units into the skin at bedtime.    Yes Historical Provider, MD  ipratropium-albuterol (DUONEB) 0.5-2.5 (3) MG/3ML SOLN Take 3 mLs by nebulization every 6 (six) hours as needed. 04/07/13  Yes Orson Eva, MD  naproxen (NAPROSYN) 500 MG tablet Take 500 mg by mouth 2 (two) times daily with a meal.   Yes Historical Provider, MD  Nutritional Supplements (FEEDING SUPPLEMENT, NEPRO CARB STEADY,) LIQD Take 237 mLs by mouth 3 (three) times daily with meals. 04/07/13  Yes Orson Eva, MD  oxycodone (ROXICODONE) 30 MG immediate release tablet Take 30 mg by mouth every 8 (eight) hours as needed for pain.   Yes Historical Provider, MD  pantoprazole (PROTONIX) 40 MG tablet Take 40 mg by mouth daily. 08/19/12  Yes Kathie Dike, MD  SPIRIVA HANDIHALER 18 MCG inhalation capsule Place 18 mcg into inhaler and inhale daily.  03/08/13  Yes Historical Provider, MD  thiamine (VITAMIN B-1) 100 MG tablet Take 100 mg by mouth daily.   Yes Historical Provider, MD  camphor-menthol Timoteo Ace) lotion Apply topically as needed for itching. 04/07/13   Orson Eva, MD   Physical Exam: Filed Vitals:   04/26/13 1930  BP:   Pulse: 81  Temp:   Resp: 19    BP 157/67  Pulse 81  Temp(Src) 98.6 F (37 C) (Oral)  Resp 19  Ht 6' (1.829 m)  Wt 68.04 kg (150 lb)  BMI 20.34 kg/m2  SpO2 97%  General:  Appears anxious. He is clinically dehydrated. Eyes: PERRL, normal lids, irises &  conjunctiva ENT: grossly normal hearing, lips & tongue Neck: no LAD, masses or thyromegaly Cardiovascular: RRR, no m/r/g. No LE edema. Telemetry: SR, no arrhythmias  Respiratory: CTA bilaterally, no w/r/r. Normal respiratory effort. Abdomen: soft, ntnd Skin: no rash or induration seen on limited exam Musculoskeletal: grossly normal tone BUE/BLE Psychiatric: grossly normal mood and affect, speech fluent and appropriate Neurologic: grossly non-focal.          Labs on Admission:  Basic Metabolic Panel:  Recent Labs Lab 04/26/13 1800  NA 135*  K >7.7*  CL 98  CO2 18*  GLUCOSE 222*  BUN 78*  CREATININE 9.91*  CALCIUM 8.1*  MG 2.8*  PHOS 8.0*       CBC:  Recent Labs Lab 04/26/13 1800  WBC 7.5  NEUTROABS 5.0  HGB 12.2*  HCT 38.3*  MCV 95.3  PLT 168   Cardiac Enzymes:  Recent Labs Lab 04/26/13 1800  TROPONINI <0.30    BNP (last 3 results)  Recent Labs  08/17/12 0746  PROBNP 3324.0*      Radiological Exams on Admission: Dg Chest Portable 1 View  04/26/2013   CLINICAL DATA:  Fatigue.  EXAM: PORTABLE CHEST - 1 VIEW  COMPARISON:  04/09/2013.  FINDINGS: A dialysis catheter is in stable position, tip at the upper right atrium.  No cardiomegaly for technique.  Unchanged mediastinal contours.  Diffuse interstitial coarsening which is new from prior. Cephalized pulmonary blood flow. Small right pleural effusion.  Remote left-sided rib fractures.  No acute osseous findings.  IMPRESSION: Pulmonary edema and small right pleural effusion.   Electronically Signed   By: Jorje Guild M.D.   On: 04/26/2013 18:29    EKG: Independently reviewed. Normal sinus rhythm without any acute ST-T wave changes. In particular there are no peaked T waves.  Assessment/Plan   1. Acute hyperkalemia. 2. End-stage renal disease on dialysis. 3. Fluid overload with evidence of pulmonary edema and pleural effusions explaining his dyspnea. 4. Chest pain arrest, unclear  etiology. 5. Hypertension. 6. Type 2 diabetes mellitus.  Plan: 1. Admit to step down unit for emergent hemodialysis. 2. Transfer patient to [facility]  hospital for hemodialysis.   Code Status: Full code.  Family Communication: Discuss plan with patient at bedside.   Disposition Plan: Depending on progress.  Time spent: 45 minutes.  Doree Albee [facility]

## 2013-04-26 NOTE — ED Notes (Signed)
Pt with HR in 40's EKG done and EDP Kohut seen, received new orders, EDP ordered a repeat EKG, will continue to monitor

## 2013-04-26 NOTE — ED Notes (Signed)
CRITICAL VALUE ALERT  Critical value received:  Potassium greater than 7.7  Date of notification: 04/26/13  Time of notification:  2536  Critical value read back:yes  Nurse who received alert: j. Jerl Santos  MD notified (1st page): [email or web address]  Time of first page:  1845  MD notified (2nd page):  Time of second page:  Responding MD: [email or web address]  Time MD responded:  [phone]

## 2013-04-26 NOTE — ED Notes (Signed)
Pt complain of generalized pain all over and weakness. States he was recently discharged from [facility] for the same

## 2013-04-26 NOTE — ED Provider Notes (Signed)
CSN: [id number]     Arrival date & time 04/26/13  1712 History   First MD Initiated Contact with Patient 04/26/13 1752     Chief Complaint  Patient presents with  . Fatigue     (Consider location/radiation/quality/duration/timing/severity/associated sxs/prior Treatment) HPI   53 year old male with generalized malaise, generalized weakness and diffuse pain. Patient with a complex past medical history. Reports that his symptoms feel similar, although more severe, to when presented before recent hospitalization. Then he had symptomatic bradycardia in the setting of severe hyperkalemia, beta blocker usage, atrial fibrillation. Improved with HD and now off beta blocker. ECHO with EF 55-60%. Dialysis T,H,S. Last had Sat. None today 2/2 feeling so bad. Just no energy. Feels extremely weak to the point that even just standing up or using hands to write feels very labored. Reports compliance with medications. No fever or chills.    Past Medical History  Diagnosis Date  . Diabetes mellitus   . Hypertension   . ESRD on hemodialysis     On HD in [place] [facility] [place] unit.  Nephrologist is Dr. Hinda Lenis. Gets HD on TTS schedule.   [name] Retinopathy   . Peripheral neuropathy   . Chest pain     a. 05/2011 Cath: nonobs dzs.  . Myocardial infarction 2000  . Depression   . Liver disease   . Cancer     Colon  . GERD (gastroesophageal reflux disease)   . CAD (coronary artery disease)   . OA (osteoarthritis)     Hx: of  . Anxiety   . Esophagitis 2013    and gastritis by EGD   Past Surgical History  Procedure Laterality Date  . Colostomy  Ileostomy    colon cancer  . Amputation      left pinky toe  . Esophagogastroduodenoscopy  05/23/2011    Procedure: ESOPHAGOGASTRODUODENOSCOPY (EGD);  Surgeon: Beryle Beams, MD;  Location: [facility] ENDOSCOPY;  Service: Endoscopy;  Laterality: N/A;  . Colon surgery  2009  . Cardiac catheterization  05/22/2011    [place], LAD [place], D1 80-90% at bifurcation, OM1 40-50%,  OM2 50%, RCA irregular. Ao root no dilatation or dissection. Recommendations: I'm not sure how to explain this patient's presentation of severe substernal chest pain with associated vomiting. He does not have an obvious coronary culprit vessel and there is no obvious proximal aortic pathology. He is medically very complex  . Eye surgery    . Av fistula placement Right 03/02/2013    Procedure: INSERTION OF ARTERIOVENOUS (AV) GORE-TEX GRAFT ARM - RIGHT FOREARM VERSUS UPPER ARM;  Surgeon: Mal Misty, MD;  Location: [facility] OR;  Service: Vascular;  Laterality: Right;  . Fistulogram Right 04/06/2013    Procedure: FISTULOGRAM-RIGHT ARM WITH BALLOON ANGIOPLASTY;  Surgeon: Conrad Marthasville, MD;  Location: [place];  Service: Vascular;  Laterality: Right;   Family History  Problem Relation Age of Onset  . Cancer Mother   . Heart disease Father   . Cancer Father    History  Substance Use Topics  . Smoking status: Current Every Day Smoker -- 0.50 packs/day for 35 years    Types: Cigarettes  . Smokeless tobacco: Former Systems developer     Comment: Quit chewing > 10 years ago, formerly smoked more.  . Alcohol Use: No    Review of Systems  All systems reviewed and negative, other than as noted in HPI.   Allergies  Codeine; Kadian; and Prednisone  Home Medications   Current Outpatient Rx  Name  Route  Sig  Dispense  Refill  . folic acid (FOLVITE) 1 MG tablet   Oral   Take 1 mg by mouth daily.         . insulin aspart (NOVOLOG) 100 UNIT/ML injection   Subcutaneous   Inject 4-12 Units into the skin 3 (three) times daily as needed for high blood sugar (Uses a sliding scale at home).         . insulin glargine (LANTUS) 100 UNIT/ML injection   Subcutaneous   Inject 15 Units into the skin at bedtime.          [name] ipratropium-albuterol (DUONEB) 0.5-2.5 (3) MG/3ML SOLN   Nebulization   Take 3 mLs by nebulization every 6 (six) hours as needed.   3 mL   10   . naproxen (NAPROSYN) 500 MG tablet   Oral    Take 500 mg by mouth 2 (two) times daily with a meal.         . Nutritional Supplements (FEEDING SUPPLEMENT, NEPRO CARB STEADY,) LIQD   Oral   Take 237 mLs by mouth 3 (three) times daily with meals.      0   . oxycodone (ROXICODONE) 30 MG immediate release tablet   Oral   Take 30 mg by mouth every 8 (eight) hours as needed for pain.         . pantoprazole (PROTONIX) 40 MG tablet   Oral   Take 40 mg by mouth daily.         [name] SPIRIVA HANDIHALER 18 MCG inhalation capsule   Inhalation   Place 18 mcg into inhaler and inhale daily.          [name] thiamine (VITAMIN B-1) 100 MG tablet   Oral   Take 100 mg by mouth daily.         . camphor-menthol (SARNA) lotion   Topical   Apply topically as needed for itching.   222 mL   0    BP 134/65  Pulse 43  Temp(Src) 98.6 F (37 C) (Oral)  Resp 10  Ht 6' (1.829 m)  Wt 150 lb (68.04 kg)  BMI 20.34 kg/m2  SpO2 100% Physical Exam  Nursing note and vitals reviewed. Constitutional: He appears well-developed and well-nourished. No distress.  HENT:  Head: Normocephalic and atraumatic.  Eyes: Conjunctivae are normal. Right eye exhibits no discharge. Left eye exhibits no discharge.  Neck: Neck supple.  Cardiovascular: Regular rhythm and normal heart sounds.  Exam reveals no gallop and no friction rub.   No murmur heard. Dialysis cath L chest. Insertion site w/o concerning skin changes. Bradycardic. Irregularly irregular.   Pulmonary/Chest: Effort normal and breath sounds normal. No respiratory distress.  Abdominal: Soft. He exhibits no distension. There is no tenderness.  l sided ostomy  Musculoskeletal: He exhibits no edema and no tenderness.  Neurological: He is alert.  Skin: Skin is warm and dry.  Psychiatric: He has a normal mood and affect. His behavior is normal. Thought content normal.    ED Course  Procedures (including critical care time)  CRITICAL CARE Performed by: Virgel Manifold  Total critical care time: 35  minutes  Critical care time was exclusive of separately billable procedures and treating other patients. Critical care was necessary to treat or prevent imminent or life-threatening deterioration. Critical care was time spent personally by me on the following activities: development of treatment plan with patient and/or surrogate as well as nursing, discussions with consultants, evaluation of patient's response to treatment,  examination of patient, obtaining history from patient or surrogate, ordering and performing treatments and interventions, ordering and review of laboratory studies, ordering and review of radiographic studies, pulse oximetry and re-evaluation of patient's condition.  Labs Review Labs Reviewed  CBC WITH DIFFERENTIAL - Abnormal; Notable for the following:    RBC 4.02 (*)    Hemoglobin 12.2 (*)    HCT 38.3 (*)    All other components within normal limits  BASIC METABOLIC PANEL - Abnormal; Notable for the following:    Sodium 135 (*)    Potassium >7.7 (*)    CO2 18 (*)    Glucose, Bld 222 (*)    BUN 78 (*)    Creatinine, Ser 9.91 (*)    Calcium 8.1 (*)    GFR calc non Af Amer 5 (*)    GFR calc Af Amer 6 (*)    All other components within normal limits  MAGNESIUM - Abnormal; Notable for the following:    Magnesium 2.8 (*)    All other components within normal limits  PHOSPHORUS - Abnormal; Notable for the following:    Phosphorus 8.0 (*)    All other components within normal limits  CBG MONITORING, ED - Abnormal; Notable for the following:    Glucose-Capillary 132 (*)    All other components within normal limits  TROPONIN I  COMPREHENSIVE METABOLIC PANEL  CBC  TROPONIN I  TROPONIN I  TROPONIN I  BASIC METABOLIC PANEL   Imaging Review Dg Chest Portable 1 View  04/26/2013   CLINICAL DATA:  Fatigue.  EXAM: PORTABLE CHEST - 1 VIEW  COMPARISON:  04/09/2013.  FINDINGS: A dialysis catheter is in stable position, tip at the upper right atrium.  No cardiomegaly for  technique.  Unchanged mediastinal contours.  Diffuse interstitial coarsening which is new from prior. Cephalized pulmonary blood flow. Small right pleural effusion.  Remote left-sided rib fractures.  No acute osseous findings.  IMPRESSION: Pulmonary edema and small right pleural effusion.   Electronically Signed   By: Jorje Guild M.D.   On: 04/26/2013 18:29     EKG Interpretation   Date/Time:  Tuesday April 26 2013 17:42:32 EDT Ventricular Rate:  47 PR Interval:    QRS Duration: 114 QT Interval:  460 QTC Calculation: 407 R Axis:   -77 Text Interpretation:  Atrial fibrillation with slow ventricular response  Left axis deviation Right bundle branch block Septal infarct (cited on or  before 08-Sep-2012) Abnormal ECG When compared with ECG of 13-Apr-2013  10:30, Atrial fibrillation has replaced Sinus rhythm Vent. rate has  decreased BY  24 BPM Confirmed by [employer]roleum  MD, Ell Tiso ([id number]) on 04/26/2013  6:09:13 PM      MDM   Final diagnoses:  Chest pain at rest  Diabetes mellitus  ESRD on hemodialysis  Hyperkalemia  Hypertension  .   52yM with generalized weakness/malaise pain. Afib with rate 20-50s. Empiric bicarb/Ca ordered for presumed hyperkalemia. BP fine and mentating normally. Subsequent labs confirm and additional meds ordered. HR improved to 70-80s and now sinus. Discussed with Dr Lorrene Reid, nephrology, for possible transfer to [facility] for dialysis. With clinical response, she does not feel he needs emergent transfer. Recommending additional kayexalate and repeating labs. If remains HD stable and K trending down then dialysis can be deferred until tomorrow. Again discussed with hospitalist and will admit to step down.    Virgel Manifold, MD 04/26/13 2257

## 2013-04-26 NOTE — Evaluation (Signed)
Spoke with Dr. Anastasio Champion about pt at [facility].  53 year-old male history of end-stage renal disease on hemodialysis 3 times a week coming in with non specific chest pain. Noted potassium is 7.7. Has gotten IV calcium gluconate, albuterol, insulin, sodium bicarbonate. Case was discussed at [place]. Patient will be transferred to [facility] for emergent dialysis. We'll accept to a telemetry bed. [facility] team 10.

## 2013-04-26 NOTE — ED Notes (Signed)
Kirstin from lab called to say that repeat blood had delta'd from before.  She stated there was a big change in Na+ and K+ and reported a K+ of 6.5.

## 2013-04-27 ENCOUNTER — Encounter (HOSPITAL_COMMUNITY): Payer: Self-pay | Admitting: *Deleted

## 2013-04-27 DIAGNOSIS — I4891 Unspecified atrial fibrillation: Secondary | ICD-10-CM

## 2013-04-27 DIAGNOSIS — I2510 Atherosclerotic heart disease of native coronary artery without angina pectoris: Secondary | ICD-10-CM

## 2013-04-27 LAB — BASIC METABOLIC PANEL
BUN: 26 mg/dL — AB (ref 6–23)
CALCIUM: 8.1 mg/dL — AB (ref 8.4–10.5)
CO2: 28 mEq/L (ref 19–32)
CREATININE: 5.25 mg/dL — AB (ref 0.50–1.35)
Chloride: 96 mEq/L (ref 96–112)
GFR calc Af Amer: 13 mL/min — ABNORMAL LOW (ref >90)
GFR, EST NON AFRICAN AMERICAN: 11 mL/min — AB (ref >90)
GLUCOSE: 128 mg/dL — AB (ref 70–99)
Potassium: 6 mEq/L — ABNORMAL HIGH (ref 3.7–5.3)
Sodium: 137 mEq/L (ref 137–147)

## 2013-04-27 LAB — COMPREHENSIVE METABOLIC PANEL
ALBUMIN: 2.9 g/dL — AB (ref 3.5–5.2)
ALK PHOS: 179 U/L — AB (ref 39–117)
ALT: 69 U/L — AB (ref 0–53)
AST: 53 U/L — AB (ref 0–37)
BILIRUBIN TOTAL: 0.2 mg/dL — AB (ref 0.3–1.2)
BUN: 79 mg/dL — ABNORMAL HIGH (ref 6–23)
CO2: 19 mEq/L (ref 19–32)
Calcium: 7.8 mg/dL — ABNORMAL LOW (ref 8.4–10.5)
Chloride: 102 mEq/L (ref 96–112)
Creatinine, Ser: 10.74 mg/dL — ABNORMAL HIGH (ref 0.50–1.35)
GFR calc Af Amer: 6 mL/min — ABNORMAL LOW (ref >90)
GFR calc non Af Amer: 5 mL/min — ABNORMAL LOW (ref >90)
Glucose, Bld: 134 mg/dL — ABNORMAL HIGH (ref 70–99)
POTASSIUM: 6.9 meq/L — AB (ref 3.7–5.3)
SODIUM: 142 meq/L (ref 137–147)
Total Protein: 6.9 g/dL (ref 6.0–8.3)

## 2013-04-27 LAB — CBC
HCT: 31.9 % — ABNORMAL LOW (ref 39.0–52.0)
Hemoglobin: 10.3 g/dL — ABNORMAL LOW (ref 13.0–17.0)
MCH: 30.3 pg (ref 26.0–34.0)
MCHC: 32.3 g/dL (ref 30.0–36.0)
MCV: 93.8 fL (ref 78.0–100.0)
PLATELETS: 157 10*3/uL (ref 150–400)
RBC: 3.4 MIL/uL — AB (ref 4.22–5.81)
RDW: 15.2 % (ref 11.5–15.5)
WBC: 5.4 10*3/uL (ref 4.0–10.5)

## 2013-04-27 LAB — TROPONIN I
Troponin I: <0.3 ng/mL (ref <0.30)
Troponin I: <0.3 ng/mL (ref <0.30)

## 2013-04-27 LAB — GLUCOSE, CAPILLARY
Glucose-Capillary: 142 mg/dL — ABNORMAL HIGH (ref 70–99)
Glucose-Capillary: 104 mg/dL — ABNORMAL HIGH (ref 70–99)
Glucose-Capillary: 101 mg/dL — ABNORMAL HIGH (ref 70–99)
Glucose-Capillary: 144 mg/dL — ABNORMAL HIGH (ref 70–99)

## 2013-04-27 LAB — MRSA PCR SCREENING: MRSA by PCR: NEGATIVE

## 2013-04-27 LAB — HEPATITIS B SURFACE ANTIGEN: Hepatitis B Surface Ag: NEGATIVE

## 2013-04-27 MED ORDER — HEPARIN SODIUM (PORCINE) 1000 UNIT/ML DIALYSIS
20.0000 [IU]/kg | INTRAMUSCULAR | Status: DC | PRN
  Administered 2013-04-27 – 2013-04-28 (×2): 1400 [IU] via INTRAVENOUS_CENTRAL
  Filled 2013-04-27: qty 2

## 2013-04-27 MED ORDER — CALCIUM ACETATE 667 MG PO CAPS
1334.0000 mg | ORAL_CAPSULE | Freq: Three times a day (TID) | ORAL | Status: DC
  Administered 2013-04-27 – 2013-04-29 (×6): 1334 mg via ORAL
  Filled 2013-04-27 (×7): qty 2

## 2013-04-27 MED ORDER — SODIUM CHLORIDE 0.9 % IV SOLN: 100.0000 mL | INTRAVENOUS | Status: DC | PRN

## 2013-04-27 MED ORDER — HEPARIN SODIUM (PORCINE) 1000 UNIT/ML DIALYSIS
1000.0000 [IU] | INTRAMUSCULAR | Status: DC | PRN
  Filled 2013-04-27: qty 1

## 2013-04-27 MED ORDER — NEPRO/CARBSTEADY PO LIQD: 237.0000 mL | ORAL | Status: DC | PRN

## 2013-04-27 MED ORDER — DILTIAZEM LOAD VIA INFUSION
20.0000 mg | Freq: Once | INTRAVENOUS | Status: DC
  Filled 2013-04-27: qty 20

## 2013-04-27 MED ORDER — SODIUM CHLORIDE 0.9 % IV SOLN
1.0000 g | Freq: Once | INTRAVENOUS | Status: AC
  Administered 2013-04-27: 1 g via INTRAVENOUS
  Filled 2013-04-27: qty 10

## 2013-04-27 MED ORDER — OXYCODONE HCL ER 10 MG PO T12A
10.0000 mg | EXTENDED_RELEASE_TABLET | Freq: Two times a day (BID) | ORAL | Status: DC
  Filled 2013-04-27: qty 1

## 2013-04-27 MED ORDER — ALTEPLASE 2 MG IJ SOLR: 2.0000 mg | Freq: Once | INTRAMUSCULAR | Status: AC | PRN

## 2013-04-27 MED ORDER — BACITRACIN-NEOMYCIN-POLYMYXIN 400-5-5000 EX OINT
TOPICAL_OINTMENT | Freq: Two times a day (BID) | CUTANEOUS | Status: DC
  Administered 2013-04-27 – 2013-04-28 (×4): 1 via TOPICAL
  Filled 2013-04-27 (×4): qty 1

## 2013-04-27 MED ORDER — INSULIN ASPART 100 UNIT/ML IV SOLN
10.0000 [IU] | Freq: Once | INTRAVENOUS | Status: AC
  Administered 2013-04-27: 10 [IU] via INTRAVENOUS

## 2013-04-27 MED ORDER — SODIUM BICARBONATE 8.4 % IV SOLN
50.0000 meq | Freq: Once | INTRAVENOUS | Status: DC
  Filled 2013-04-27: qty 50

## 2013-04-27 MED ORDER — DEXTROSE 50 % IV SOLN
1.0000 | Freq: Once | INTRAVENOUS | Status: AC
  Administered 2013-04-27: 25 mL via INTRAVENOUS

## 2013-04-27 MED ORDER — DEXTROSE 50 % IV SOLN
INTRAVENOUS | Status: AC
  Administered 2013-04-27: 25 mL via INTRAVENOUS
  Filled 2013-04-27: qty 50

## 2013-04-27 MED ORDER — DEXTROSE 5 % IV SOLN
5.0000 mg/h | INTRAVENOUS | Status: DC
  Filled 2013-04-27: qty 100

## 2013-04-27 MED ORDER — LIDOCAINE-PRILOCAINE 2.5-2.5 % EX CREA
1.0000 "application " | TOPICAL_CREAM | CUTANEOUS | Status: DC | PRN
  Filled 2013-04-27: qty 5

## 2013-04-27 MED ORDER — SODIUM POLYSTYRENE SULFONATE 15 GM/60ML PO SUSP
60.0000 g | Freq: Once | ORAL | Status: AC
  Administered 2013-04-27: 60 g via ORAL
  Filled 2013-04-27: qty 240

## 2013-04-27 MED ORDER — LIDOCAINE HCL (PF) 1 % IJ SOLN: 5.0000 mL | INTRAMUSCULAR | Status: DC | PRN

## 2013-04-27 MED ORDER — EPOETIN ALFA 4000 UNIT/ML IJ SOLN
4000.0000 [IU] | INTRAMUSCULAR | Status: DC
  Administered 2013-04-27: 4000 [IU] via INTRAVENOUS
  Filled 2013-04-27 (×3): qty 1

## 2013-04-27 MED ORDER — PENTAFLUOROPROP-TETRAFLUOROETH EX AERO
1.0000 "application " | INHALATION_SPRAY | CUTANEOUS | Status: DC | PRN
  Filled 2013-04-27: qty 103.5

## 2013-04-27 MED ORDER — HYDROMORPHONE HCL PF 1 MG/ML IJ SOLN
1.0000 mg | Freq: Once | INTRAMUSCULAR | Status: AC
  Administered 2013-04-27: 1 mg via INTRAVENOUS
  Filled 2013-04-27: qty 1

## 2013-04-27 NOTE — Consult Note (Addendum)
Joe Vincent MRN: [id number] DOB/AGE: 1960/07/31 53 y.o. Primary Care Physician:BURDINE,STEVEN E, MD Admit date: 04/26/2013 Chief Complaint:  Chief Complaint  Patient presents with  . Fatigue   HPI: Pt is 53 year old male with Past medical hx of ESRD who presented to Er with c/o Fatigue.  HPI dates back to yesterday when pt presented to er with Fatigue. Pt also  C/o generalized body aches. Pt also c/o Dyspnea on exertion. NO c/o Nausea/ vomiting No c/o abdominal pain NO c/o fever/ cough/ chills NO c/o change in speech/vision No c/o syncope Pt last HD was on [place] missed Tuesday.   HPI dates back to  Past Medical History  Diagnosis Date  . Diabetes mellitus   . Hypertension   . ESRD on hemodialysis     On HD in [place] at [facility].  Nephrologist is Dr. Hinda Lenis. Gets HD on TTS schedule.   [name] Retinopathy   . Peripheral neuropathy   . Chest pain     a. 05/2011 Cath: nonobs dzs.  . Myocardial infarction 2000  . Depression   . Liver disease   . Cancer     Colon  . GERD (gastroesophageal reflux disease)   . CAD (coronary artery disease)   . OA (osteoarthritis)     Hx: of  . Anxiety   . Esophagitis 2013    and gastritis by EGD        Family History  Problem Relation Age of Onset  . Cancer Mother   . Heart disease Father   . Cancer Father     Social History:  reports that he has been smoking Cigarettes.  He has a 17.5 pack-year smoking history. He has quit using smokeless tobacco. He reports that he does not drink alcohol or use illicit drugs.   Allergies:  Allergies  Allergen Reactions  . Codeine Swelling and Rash  . Kadian [Morphine Sulfate Er] Itching  . Prednisone Other (See Comments)    unknown    Medications Prior to Admission  Medication Sig Dispense Refill  . folic acid (FOLVITE) 1 MG tablet Take 1 mg by mouth daily.      . insulin aspart (NOVOLOG) 100 UNIT/ML injection Inject 4-12 Units into the skin 3 (three) times daily as needed for  high blood sugar (Uses a sliding scale at home).      . insulin glargine (LANTUS) 100 UNIT/ML injection Inject 15 Units into the skin at bedtime.       [name] ipratropium-albuterol (DUONEB) 0.5-2.5 (3) MG/3ML SOLN Take 3 mLs by nebulization every 6 (six) hours as needed.  3 mL  10  . naproxen (NAPROSYN) 500 MG tablet Take 500 mg by mouth 2 (two) times daily with a meal.      . Nutritional Supplements (FEEDING SUPPLEMENT, NEPRO CARB STEADY,) LIQD Take 237 mLs by mouth 3 (three) times daily with meals.    0  . oxycodone (ROXICODONE) 30 MG immediate release tablet Take 30 mg by mouth every 8 (eight) hours as needed for pain.      . pantoprazole (PROTONIX) 40 MG tablet Take 40 mg by mouth daily.      [name] SPIRIVA HANDIHALER 18 MCG inhalation capsule Place 18 mcg into inhaler and inhale daily.       [name] thiamine (VITAMIN B-1) 100 MG tablet Take 100 mg by mouth daily.      . camphor-menthol (SARNA) lotion Apply topically as needed for itching.  222 mL  0  HYI:FOYDX from the symptoms mentioned above,there are no other symptoms referable to all systems reviewed.  . feeding supplement (NEPRO CARB STEADY)  237 mL Oral TID WC  . folic acid  1 mg Oral Daily  . heparin  5,000 Units Subcutaneous 3 times per day  . insulin aspart  0-5 Units Subcutaneous QHS  . insulin aspart  0-9 Units Subcutaneous TID WC  . insulin glargine  15 Units Subcutaneous QHS  . pantoprazole  40 mg Oral Daily  . sodium bicarbonate  50 mEq Intravenous Once  . thiamine  100 mg Oral Daily  . tiotropium  18 mcg Inhalation Daily      Physical Exam: Vital signs in last 24 hours: Temp:  [97.8 F (36.6 C)-98.6 F (37 C)] 98 F (36.7 C) (03/25 0400) Pulse Rate:  [25-88] 63 (03/25 0400) Resp:  [8-24] 12 (03/25 0600) BP: (114-157)/(60-92) 114/60 mmHg (03/25 0600) SpO2:  [76 %-100 %] 97 % (03/25 0400) Weight:  [150 lb (68.04 kg)-154 lb 15.7 oz (70.3 kg)] 154 lb 15.7 oz (70.3 kg) (03/25 0500) Weight change:  Last BM Date:  04/26/13  Intake/Output from previous day: 03/24 0701 - 03/25 0700 In: 240 [P.O.:240] Out: -      Physical Exam: General- pt is awake,alert, oriented to time place and person Resp- No acute REsp distress, CTA B/L NO Rhonchi CVS- S1S2 regular in rate and rhythm GIT- BS+, soft, NT, ND EXT- NO LE Edema, Cyanosis CNS- CN 2-12 grossly intact. Moving all 4 extremities Psych- normal mod and affect Access- PC + AVF   Lab Results: CBC  Recent Labs  04/26/13 1800 04/27/13 0438  WBC 7.5 5.4  HGB 12.2* 10.3*  HCT 38.3* 31.9*  PLT 168 157    BMET  Recent Labs  04/26/13 2008 04/27/13 0438  NA 142 142  K 6.5* 6.9*  CL 101 102  CO2 20 19  GLUCOSE 176* 134*  BUN 79* 79*  CREATININE 10.01* 10.74*  CALCIUM 8.1* 7.8*    MICRO Recent Results (from the past 240 hour(s))  MRSA PCR SCREENING     Status: None   Collection Time    04/26/13  9:30 PM      Result Value Ref Range Status   MRSA by PCR NEGATIVE  NEGATIVE Final   Comment:            The GeneXpert MRSA Assay (FDA     approved for NASAL specimens     only), is one component of a     comprehensive MRSA colonization     surveillance program. It is not     intended to diagnose MRSA     infection nor to guide or     monitor treatment for     MRSA infections.      Lab Results  Component Value Date   PTH 149.2* 05/24/2011   CALCIUM 7.8* 04/27/2013   CAION 1.18 09/11/2012   PHOS 8.0* 04/26/2013   Alb 2.9 Corrected calcium 8.0+0.8=8.8   Impression: 1)Renal  ESRD on HD On MWF/ TTS schedule Pt is to be dialyzed today   2)HTN BP at goal   3)Anemia HGb at goal (9--11)   4)CKD Mineral-Bone Disorder PTH acceptable. Secondary Hyperparathyroidism  Present. Phosphorus NOt at goal.   Sec to dietary indiscretion Corrected calcium at goal  5)Resp Hx ofCOPD Primary MD following  6)Electrolyes HyperKalemic NOrmonatremic   7)Acid base Co2 at goal     Plan:  wil dialyze today' Will use 2 k  bath for  2 hr s and then 1 k bath for last 2 hrs for Cardiac stability. Will start PO Binders Will keep on epo    Addendum Pt seen on HD. Pt tolerating tx well.   BHUTANI,MANPREET S 04/27/2013, 9:02 AM

## 2013-04-27 NOTE — Progress Notes (Signed)
Pt is complaining of generalized neuropathy pain. Pt states " that OXY IR medication does not help my pain" When nursing staff ask what the patient takes at home for pain relief he stated " I take Morphine 80 mg twice daily and Percocet 30 mg."  MD paged to make aware. MD has asked that our pharmacy staff to please contact the pharmacy that the patient uses to verify medications.

## 2013-04-27 NOTE — Progress Notes (Signed)
Potassium of 6.9 called by lab this AM.  MD notified via text page

## 2013-04-27 NOTE — Progress Notes (Signed)
Addendum: mentioned to HD RN, about leaving the hospital and Hurting/Killing himself Will get 1:1 sitter Psych evaluation  Domenic Polite, MD [phone]

## 2013-04-27 NOTE — Progress Notes (Signed)
MD has ordered pain medication based on what the local pharmacy has on file. When ordered pain medication presented to the patient, he refuses and states he is leaving since he has been in pain for several hours. MD paged to make aware. The patient also refuses to sign AMA paperwork.

## 2013-04-27 NOTE — Progress Notes (Signed)
The patient became argumentative and began to refuse medications to be given via IV stating "The last time I was given all these medications my ileostomy bag exploded, I cannot have all these fluids" I asked Dr. Broadus John to come speak with the patient and educate him on the medications ordered since the patient became argumentative with staff. MD in room to speak with patient regarding ordered medications for the elevated potassium this am.

## 2013-04-27 NOTE — Progress Notes (Signed)
MD paged to make aware that the patient is agitated and is requesting his pain medication.

## 2013-04-27 NOTE — Procedures (Signed)
   HEMODIALYSIS TREATMENT NOTE:  4 hour low heparin, urgent dialysis completed via left chest tunneled catheter.  Exit site unremarkable, but erythema noted around wing sutures.  Catheter has been in place 2 months (per patient). Order received from Dr. Theador Hawthorne to remove sutures and apply triple antibiotic ointment to site bid.   2K/2.5Ca dialysate used for first two hours of treatment, 1K/2.5Ca dialysate then used for remaining two hours.    Pt was calm and cooperative until lunch tray arrived around 1230:  Upset about being on a renal diet.  We discussed complications of hyperkalemia and indications for this diet choice. Pt was agitated (tapping feet, repositioning, sighing).  He then asked about pain management.  "How long am I supposed to lie here and suffer?"  Stated neuropathic pain is constant and that Oxycontin IR "doesn't even touch it (pain)."  Primary RN discussed this with Dr. Broadus John and medication reconciliation was repeated.  Pt stated he had been prescribed "Morphine 80 mg" by his primary physician who has since referred him to a pain management clinic.  He continued to voice frustrations about being in pain and began asking, "how much longer do I have (on dialysis)?"  Asked to end treatment with 40 minutes remaining.  Risks/benefits were reviewed.  He stated, "I just can't sit here when I'm hurting like this...it's like no one trusts me.  I'm not a junkie!  I'm just tired of being in pain!" He began disclosing that he really has "nothing to live for, nothing to look forward to" and described intention to "get out of here" and "get me something good to eat, take my pistol, and blow my frickin' head off!"  Upon further discussion pt recanted previous statement, but maintained he "just need(ed) to leave!"  He stated that he was a veteran, that he had rights, that he was going to leave, that no one was going to stop him.  He informed Sharene Skeans, his primary RN (at this time unaware of pt's prior  comments) that he was leaving.  I questioned him in the presence of Nedine about his intentions upon departure.  He described getting a decent meal and following up with his outpatient dialysis center and stated previous conversation was "just venting".  After Ms. Rowe left pt's room he mumbled he would "jump off the nearest bridge" and said "I told you those things in confidence."  At this point, pt was tearful.  I explained the need to report his comments and I discussed the above with Dr. Broadus John.  Plan: sitter, tele-psych evaluation.  Above also reported to Dr. Theador Hawthorne.  Jakyri Brunkhorst L. Rebeckah Masih, RN, CDN

## 2013-04-27 NOTE — BH Assessment (Signed)
Tele Assessment Note   Joe Vincent is a 53 y.o. divorced white male.  He presents at the [facility] ICU where he was admitted for medical problems.  He is unaccompanied.  During his course of treatment he reported thoughts of hurting himself, for which reason his attending physician, Dr. Broadus John, requests a TTS assessment.  Stressors: Pt has a number of chronic medical conditions that cause him to be disabled.  His PCP has told him that in the next 4 - 5 months he can expect to be wheelchair bound.  He has disability benefits, but they are insufficient to take care of his ongoing financial needs.  He has minimal social supports.  Lethality: Suicidality: When this writer initially asks the pt about SI he replies, "I'm not going to answer that," even after prompting him with Dr Delene Loll reports.  These included pt telling hospital staff that he was having thoughts of hurting himself and that he wanted to "end it all," as well as an assertion that he has a gun.  At the end of this assessment I ask the pt if he feels that he needs to be in a psychiatric hospital so as not to harm himself, and he replies in the affirmative.  He adds that while he does not have a gun in his home, he can easily obtain one.  Pt reports a history of a single suicide attempt by unspecified means 8 - 10 years ago when his wife acrimoniously divorced him and took their children with her.  He is not able to identify any deterrents against suicide.  He denies any history of self mutilation.  Pt endorses depressed mood with symptoms noted in the "risk to self" assessment below. Homicidality: Pt denies homicidal thoughts or physical aggression, adding that he does not believe in violence.  Pt denies having any legal problems at this time.  Pt is calm and while he is initially guarded, he becomes increasingly cooperative during assessment. Psychosis: Pt denies hallucinations.  Pt does not appear to be responding to internal stimuli and  exhibits no delusional thought.  Pt's reality testing appears to be intact. Substance Abuse: Pt denies any current or past substance abuse problems.  Pt does not appear to be intoxicated or in withdrawal at this time.  Social History: Pt lives alone.  He identifies a Industrial/product designer as his closest social support.  He identifies his brother-in-law, Clinton Gallant [phone]), as his emergency contact.  His two children are now adults, but he only speaks to them about once every 6 months.  He receives disability benefits.  His highest level of education is 2 years of college.  Treatment History: Pt reports that a couple years ago he was admitted to an unnamed psychiatric hospital near [place] for [place].  He has never received outpatient services.  As noted, pt now believes that he needs to be admitted to a facility for safety.    Axis I: Major Depressive Disorder, recurrent, severe, without psychotic features 296.33 Axis II: Deferred 799.9 Axis III:  Past Medical History  Diagnosis Date  . Diabetes mellitus   . Hypertension   . ESRD on hemodialysis     On HD in [place] at [place] unit.  Nephrologist is Dr. Hinda Lenis. Gets HD on TTS schedule.   [name] Retinopathy   . Peripheral neuropathy   . Chest pain     a. 05/2011 Cath: nonobs dzs.  . Myocardial infarction 2000  . Depression   . Liver disease   .  Cancer     Colon  . GERD (gastroesophageal reflux disease)   . CAD (coronary artery disease)   . Anxiety   . Esophagitis 2013    and gastritis by EGD  . OA (osteoarthritis)     Hx: of   Axis IV: economic problems, problems with primary support group and general medical problems Axis V: GAF = 35  Past Medical History:  Past Medical History  Diagnosis Date  . Diabetes mellitus   . Hypertension   . ESRD on hemodialysis     On HD in [place] at [place] unit.  Nephrologist is Dr. Hinda Lenis. Gets HD on TTS schedule.   [name] Retinopathy   . Peripheral neuropathy   . Chest pain     a. 05/2011  Cath: nonobs dzs.  . Myocardial infarction 2000  . Depression   . Liver disease   . Cancer     Colon  . GERD (gastroesophageal reflux disease)   . CAD (coronary artery disease)   . Anxiety   . Esophagitis 2013    and gastritis by EGD  . OA (osteoarthritis)     Hx: of    Past Surgical History  Procedure Laterality Date  . Colostomy  Ileostomy    colon cancer  . Amputation      left pinky toe  . Esophagogastroduodenoscopy  05/23/2011    Procedure: ESOPHAGOGASTRODUODENOSCOPY (EGD);  Surgeon: Beryle Beams, MD;  Location: [facility] ENDOSCOPY;  Service: Endoscopy;  Laterality: N/A;  . Colon surgery  2009  . Cardiac catheterization  05/22/2011    [place], L[place] OK, D1 80-90% at bifurcation, OM1 40-50%, OM2 50%, RCA irregular. Ao root no dilatation or dissection. Recommendations: I'm not sure how to explain this patient's presentation of severe substernal chest pain with associated vomiting. He does not have an obvious coronary culprit vessel and there is no obvious proximal aortic pathology. He is medically very complex  . Eye surgery    . Av fistula placement Right 03/02/2013    Procedure: INSERTION OF ARTERIOVENOUS (AV) GORE-TEX GRAFT ARM - RIGHT FOREARM VERSUS UPPER ARM;  Surgeon: Mal Misty, MD;  Location: [facility] OR;  Service: Vascular;  Laterality: Right;  . Fistulogram Right 04/06/2013    Procedure: FISTULOGRAM-RIGHT ARM WITH BALLOON ANGIOPLASTY;  Surgeon: Conrad [place], MD;  Location: [facility];  Service: Vascular;  Laterality: Right;    Family History:  Family History  Problem Relation Age of Onset  . Cancer Mother   . Heart disease Father   . Cancer Father     Social History:  reports that he has been smoking Cigarettes.  He has a 17.5 pack-year smoking history. He has quit using smokeless tobacco. He reports that he does not drink alcohol or use illicit drugs.  Additional Social History:  Alcohol / Drug Use Pain Medications: Denies Prescriptions: Denies Over the Counter:  Denies History of alcohol / drug use?: No history of alcohol / drug abuse  CIWA: CIWA-Ar BP: 166/75 mmHg Pulse Rate: 70 COWS:    Allergies:  Allergies  Allergen Reactions  . Codeine Swelling and Rash  . Kadian [Morphine Sulfate Er] Itching  . Prednisone Other (See Comments)    unknown    Home Medications:  Medications Prior to Admission  Medication Sig Dispense Refill  . folic acid (FOLVITE) 1 MG tablet Take 1 mg by mouth daily.      . insulin aspart (NOVOLOG) 100 UNIT/ML injection Inject 4-12 Units into the skin 3 (three) times daily as  needed for high blood sugar (Uses a sliding scale at home).      . insulin glargine (LANTUS) 100 UNIT/ML injection Inject 15 Units into the skin at bedtime.       [name] ipratropium-albuterol (DUONEB) 0.5-2.5 (3) MG/3ML SOLN Take 3 mLs by nebulization every 6 (six) hours as needed.  3 mL  10  . naproxen (NAPROSYN) 500 MG tablet Take 500 mg by mouth 2 (two) times daily with a meal.      . Nutritional Supplements (FEEDING SUPPLEMENT, NEPRO CARB STEADY,) LIQD Take 237 mLs by mouth 3 (three) times daily with meals.    0  . OxyCODONE (OXYCONTIN) 10 mg T12A 12 hr tablet Take 10 mg by mouth every 12 (twelve) hours.      [name] oxycodone (ROXICODONE) 30 MG immediate release tablet Take 30 mg by mouth every 8 (eight) hours as needed for pain.      . pantoprazole (PROTONIX) 40 MG tablet Take 40 mg by mouth daily.      [name] SPIRIVA HANDIHALER 18 MCG inhalation capsule Place 18 mcg into inhaler and inhale daily.       [name] thiamine (VITAMIN B-1) 100 MG tablet Take 100 mg by mouth daily.      . camphor-menthol (SARNA) lotion Apply topically as needed for itching.  222 mL  0    OB/GYN Status:  No LMP for male patient.  General Assessment Data Location of Assessment: [facility] ([facility]) Is this a Tele or Face-to-Face Assessment?: Tele Assessment Is this an Initial Assessment or a Re-assessment for this encounter?: Initial Assessment Living Arrangements: Alone Can pt return to  current living arrangement?: Yes Admission Status: Voluntary Is patient capable of signing voluntary admission?: Yes Transfer from: [facility] Referral Source: Medical Floor Inpatient ([place] ICU)     [place] Living Arrangements: Alone Name of Psychiatrist: None Name of Therapist: None  Education Status Is patient currently in school?: No Highest grade of school patient has completed: 2 years of college Contact person: Clinton Gallant (brother-in-law) [phone]  Risk to self Suicidal Ideation: Yes-Currently Present Suicidal Intent: Yes-Currently Present Is patient at risk for suicide?: Yes Suicidal Plan?: Yes-Currently Present Specify Current Suicidal Plan: Shoot self Access to Means: Yes Specify Access to Suicidal Means: Does not have gun in home, but can obtain one. What has been your use of drugs/alcohol within the last 12 months?: Denies Previous Attempts/Gestures: Yes How many times?: 1 (8 - 10 years ago; will not specify means) Other Self Harm Risks: Lives alone, chronic pain, physical decline; initially evasive about SI. Triggers for Past Attempts: Other (Comment) (Hostile divorce with wife taking children) Intentional Self Injurious Behavior: None Family Suicide History: No Recent stressful life event(s): Recent negative physical changes;Financial Problems Persecutory voices/beliefs?: No Depression: Yes Depression Symptoms: Despondent;Insomnia;Fatigue;Loss of interest in usual pleasures;Feeling worthless/self pity;Feeling angry/irritable (Hopelessness) Substance abuse history and/or treatment for substance abuse?: No Suicide prevention information given to non-admitted patients: Not applicable (Tele-assessment: unable to provide)  Risk to Others Homicidal Ideation: No Thoughts of Harm to Others: No Current Homicidal Intent: No Current Homicidal Plan: No Access to Homicidal Means: No Identified Victim: None History of harm to others?:  No Assessment of Violence: None Noted Violent Behavior Description: Calm, initially guarded but became cooperative Does patient have access to weapons?: Yes (Comment) (Can obtain a gun that is not in his home) Criminal Charges Pending?: No Does patient have a court date: No  Psychosis Hallucinations: None noted Delusions: None noted  Mental Status Report Appear/Hygiene: Disheveled;Other (Comment) [facility] gown) Eye Contact: Good Motor Activity: Unremarkable Speech: Other (Comment) (Unremarkable) Level of Consciousness: Alert Mood: Depressed Affect: Appropriate to circumstance;Other (Comment) (Initially guarded, but became forthcoming) Anxiety Level: None Thought Processes: Coherent;Relevant Judgement: Unimpaired Orientation: Person;Place;Time;Situation Obsessive Compulsive Thoughts/Behaviors: None  Cognitive Functioning Concentration: Decreased Memory: Recent Intact;Remote Intact IQ: Average Insight: Fair Impulse Control: Good Appetite: Fair Weight Loss: 0 Weight Gain: 20 (over past 4 months) Sleep: Decreased Total Hours of Sleep:  (Poor over past 7 mos.; 5 hrs last night, best in some time.) Vegetative Symptoms: None  ADLScreening [facility] Assessment Services) Patient's cognitive ability adequate to safely complete daily activities?: Yes Patient able to express need for assistance with ADLs?: Yes Independently performs ADLs?: Yes (appropriate for developmental age)  Prior Inpatient Therapy Prior Inpatient Therapy: Yes Prior Therapy Dates: A couple years ago: unnamed facility near [place] for SI  Prior Outpatient Therapy Prior Outpatient Therapy: No  ADL Screening (condition at time of admission) Patient's cognitive ability adequate to safely complete daily activities?: Yes Is the patient deaf or have difficulty hearing?: No Does the patient have difficulty seeing, even when wearing glasses/contacts?: No Does the patient have difficulty concentrating,  remembering, or making decisions?: No Patient able to express need for assistance with ADLs?: Yes Does the patient have difficulty dressing or bathing?: No Independently performs ADLs?: Yes (appropriate for developmental age) Does the patient have difficulty walking or climbing stairs?: No Weakness of Legs: None Weakness of Arms/Hands: None  Home Assistive Devices/Equipment Home Assistive Devices/Equipment: Gilford Rile (specify type);Cane (specify quad or straight);CBG Meter (Quad-cane)  Therapy Consults (therapy consults require a physician order) PT Evaluation Needed: No OT Evalulation Needed: No SLP Evaluation Needed: No Abuse/Neglect Assessment (Assessment to be complete while patient is alone) Physical Abuse: Denies Verbal Abuse: Denies Sexual Abuse: Denies Exploitation of patient/patient's resources: Denies Self-Neglect: Denies Values / Beliefs Cultural Requests During Hospitalization: None Spiritual Requests During Hospitalization: None Consults Spiritual Care Consult Needed: No Social Work Consult Needed: No Regulatory affairs officer (For Healthcare) Advance Directive: Patient does not have advance directive;Patient would not like information Pre-existing out of facility DNR order (yellow form or pink MOST form): No Nutrition Screen- [facility] Adult/[facility] Patient's home diet: 2 gram sodium diet;Carb modified Have you recently lost weight without trying?: No Have you been eating poorly because of a decreased appetite?: No Malnutrition Screening Tool Score: 0  Additional Information 1:1 In Past 12 Months?: No CIRT Risk: No Elopement Risk: No Does patient have medical clearance?: No (Anticipated to medically clear tomorrow (04/28/2013))     Disposition:  Disposition Initial Assessment Completed for this Encounter: Yes Disposition of Patient: Other dispositions Other disposition(s): Other (Comment) (Recommended for med-psych placement once med clear.) After consulting with Patriciaann Clan, PA at 19:17 it has been determined that pt presents a life threatening danger to himself, for which psychiatric hospitalization is indicated.  However, he believes that pt's medical complications are too great for his care to be managed at [facility].  After pt medically clears he recommends that pt be transferred to a Med-Psych facility, such as those found at [facility] and at [facility].  Jalene Mullet, MA Triage Specialist [facility] [place] 04/27/2013 7:44 PM

## 2013-04-27 NOTE — Progress Notes (Addendum)
[facility] PROGRESS NOTE  Joe Vincent WUJ:[id number] DOB: May 08, 1960 DOA: 04/26/2013 PCP: Curlene Labrum, MD  Assessment/Plan: 1. Hyperkalemia -?  Poor compliance with Renal Diet, NSAID use, missed HD 3/24 -Renal Consulted, d/w Dr.Befekadu who will notify Dr.Bhutani for Urgent HD this am -Repeat Ca gluconate, Insulin, kayexalate -Bmet post HD  2. DM -resume lantus , SSI  3. ESRD on HD with mild volume overload -should improve with HD  4. COPD -stable, nebs PRN -smoking cessation  5. Thrombocytopenia -chronic, stable  6. Spiculated mass - h/o lung CA in 2003 treated with chemo and radiation  -Fu with Pulm outpt  7. CHronic pain -on oxycodone  DVT proph: heparin SQ  Code Status: Full Code Family Communication: none at bedside Disposition Plan: keep in ICU   Consultants:  Renal, called and d/w Dr.Befekadu  HPI/Subjective: Feels weak  Objective: Filed Vitals:   04/27/13 0600  BP: 114/60  Pulse:   Temp:   Resp: 12    Intake/Output Summary (Last 24 hours) at 04/27/13 0801 Last data filed at 04/26/13 2200  Gross per 24 hour  Intake    240 ml  Output      0 ml  Net    240 ml   Filed Weights   04/26/13 1728 04/27/13 0500  Weight: 68.04 kg (150 lb) 70.3 kg (154 lb 15.7 oz)    Exam:   General:  AAOx3, no distress  Cardiovascular: S1S2/RRR  Respiratory: poor air movement, fine basilar crackles  Abdomen: soft, NT, BS present, ostomy noted  Musculoskeletal: no edema c/c   Data Reviewed: Basic Metabolic Panel:  Recent Labs Lab 04/26/13 1800 04/26/13 2008 04/27/13 0438  NA 135* 142 142  K >7.7* 6.5* 6.9*  CL 98 101 102  CO2 18* 20 19  GLUCOSE 222* 176* 134*  BUN 78* 79* 79*  CREATININE 9.91* 10.01* 10.74*  CALCIUM 8.1* 8.1* 7.8*  MG 2.8*  --   --   PHOS 8.0*  --   --    Liver Function Tests:  Recent Labs Lab 04/27/13 0438  AST 53*  ALT 69*  ALKPHOS 179*  BILITOT 0.2*  PROT 6.9  ALBUMIN 2.9*   No results found for  this basename: LIPASE, AMYLASE,  in the last 168 hours No results found for this basename: AMMONIA,  in the last 168 hours CBC:  Recent Labs Lab 04/26/13 1800 04/27/13 0438  WBC 7.5 5.4  NEUTROABS 5.0  --   HGB 12.2* 10.3*  HCT 38.3* 31.9*  MCV 95.3 93.8  PLT 168 157   Cardiac Enzymes:  Recent Labs Lab 04/26/13 1800 04/26/13 1946 04/27/13 0044 04/27/13 0731  TROPONINI <0.30 <0.30 <0.30 <0.30   BNP (last 3 results)  Recent Labs  08/17/12 0746  PROBNP 3324.0*   CBG:  Recent Labs Lab 04/26/13 2007 04/26/13 2139 04/27/13 0722  GLUCAP 132* 96 142*    Recent Results (from the past 240 hour(s))  MRSA PCR SCREENING     Status: None   Collection Time    04/26/13  9:30 PM      Result Value Ref Range Status   MRSA by PCR NEGATIVE  NEGATIVE Final   Comment:            The GeneXpert MRSA Assay (FDA     approved for NASAL specimens     only), is one component of a     comprehensive MRSA colonization     surveillance program. It is not     intended to  diagnose MRSA     infection nor to guide or     monitor treatment for     MRSA infections.     Studies: Dg Chest Portable 1 View  04/26/2013   CLINICAL DATA:  Fatigue.  EXAM: PORTABLE CHEST - 1 VIEW  COMPARISON:  04/09/2013.  FINDINGS: A dialysis catheter is in stable position, tip at the upper right atrium.  No cardiomegaly for technique.  Unchanged mediastinal contours.  Diffuse interstitial coarsening which is new from prior. Cephalized pulmonary blood flow. Small right pleural effusion.  Remote left-sided rib fractures.  No acute osseous findings.  IMPRESSION: Pulmonary edema and small right pleural effusion.   Electronically Signed   By: Jorje Guild M.D.   On: 04/26/2013 18:29    Scheduled Meds: . calcium gluconate  1 g Intravenous Once  . feeding supplement (NEPRO CARB STEADY)  237 mL Oral TID WC  . folic acid  1 mg Oral Daily  . heparin  5,000 Units Subcutaneous 3 times per day  . insulin aspart  0-5  Units Subcutaneous QHS  . insulin aspart  0-9 Units Subcutaneous TID WC  . insulin glargine  15 Units Subcutaneous QHS  . pantoprazole  40 mg Oral Daily  . sodium bicarbonate  50 mEq Intravenous Once  . thiamine  100 mg Oral Daily  . tiotropium  18 mcg Inhalation Daily   Continuous Infusions:  Antibiotics Given (last 72 hours)   None      Active Problems:   Diabetes mellitus   Hypertension   Chest pain at rest   ESRD on hemodialysis   Hyperkalemia    Time spent: 69min    Joe Vincent  [facility] Pager [phone]. If 7PM-7AM, please contact night-coverage at [email or web address], password [facility] 04/27/2013, 8:01 AM  LOS: 1 day

## 2013-04-28 DIAGNOSIS — R45851 Suicidal ideations: Secondary | ICD-10-CM

## 2013-04-28 LAB — BASIC METABOLIC PANEL
BUN: 32 mg/dL — AB (ref 6–23)
BUN: 20 mg/dL (ref 6–23)
CALCIUM: 7.9 mg/dL — AB (ref 8.4–10.5)
CALCIUM: 8.3 mg/dL — AB (ref 8.4–10.5)
CHLORIDE: 94 meq/L — AB (ref 96–112)
CO2: 27 meq/L (ref 19–32)
CO2: 30 mEq/L (ref 19–32)
CREATININE: 6.42 mg/dL — AB (ref 0.50–1.35)
CREATININE: 5.14 mg/dL — AB (ref 0.50–1.35)
Chloride: 96 mEq/L (ref 96–112)
GFR calc Af Amer: 10 mL/min — ABNORMAL LOW (ref >90)
GFR calc non Af Amer: 9 mL/min — ABNORMAL LOW (ref >90)
GFR calc non Af Amer: 12 mL/min — ABNORMAL LOW (ref >90)
GFR, EST AFRICAN AMERICAN: 14 mL/min — AB (ref >90)
GLUCOSE: 140 mg/dL — AB (ref 70–99)
Glucose, Bld: 193 mg/dL — ABNORMAL HIGH (ref 70–99)
Potassium: 5.6 mEq/L — ABNORMAL HIGH (ref 3.7–5.3)
Potassium: 5 mEq/L (ref 3.7–5.3)
Sodium: 135 mEq/L — ABNORMAL LOW (ref 137–147)
Sodium: 138 mEq/L (ref 137–147)

## 2013-04-28 LAB — GLUCOSE, CAPILLARY
GLUCOSE-CAPILLARY: 104 mg/dL — AB (ref 70–99)
GLUCOSE-CAPILLARY: 175 mg/dL — AB (ref 70–99)
Glucose-Capillary: 110 mg/dL — ABNORMAL HIGH (ref 70–99)
Glucose-Capillary: 143 mg/dL — ABNORMAL HIGH (ref 70–99)
Glucose-Capillary: 168 mg/dL — ABNORMAL HIGH (ref 70–99)
Glucose-Capillary: 39 mg/dL — CL (ref 70–99)
Glucose-Capillary: 46 mg/dL — ABNORMAL LOW (ref 70–99)

## 2013-04-28 LAB — HEPATIC FUNCTION PANEL
ALK PHOS: 188 U/L — AB (ref 39–117)
ALT: 66 U/L — ABNORMAL HIGH (ref 0–53)
AST: 48 U/L — ABNORMAL HIGH (ref 0–37)
Albumin: 3.1 g/dL — ABNORMAL LOW (ref 3.5–5.2)
BILIRUBIN TOTAL: 0.2 mg/dL — AB (ref 0.3–1.2)
Bilirubin, Direct: <0.2 mg/dL (ref 0.0–0.3)
Total Protein: 7.6 g/dL (ref 6.0–8.3)

## 2013-04-28 LAB — LIPASE, BLOOD: Lipase: 32 U/L (ref 11–59)

## 2013-04-28 MED ORDER — DEXTROSE 50 % IV SOLN
INTRAVENOUS | Status: AC
  Administered 2013-04-28: 50 mL
  Filled 2013-04-28: qty 50

## 2013-04-28 MED ORDER — ALTEPLASE 2 MG IJ SOLR
2.0000 mg | Freq: Once | INTRAMUSCULAR | Status: AC | PRN
  Filled 2013-04-28: qty 2

## 2013-04-28 MED ORDER — SODIUM CHLORIDE 0.9 % IV SOLN: 100.0000 mL | INTRAVENOUS | Status: DC | PRN

## 2013-04-28 NOTE — Progress Notes (Signed)
Recheck of cbg 104

## 2013-04-28 NOTE — Progress Notes (Addendum)
[facility] PROGRESS NOTE  Lucien Budney ZJQ:[id number] DOB: 12-Jul-1960 DOA: 04/26/2013 PCP: Curlene Labrum, MD  Assessment/Plan: 1. Hyperkalemia -?  Poor compliance with Renal Diet, NSAID use, missed HD 3/24 -Renal Consulted,  Dr.Befekadu following -Given repeat Ca gluconate, Insulin, kayexalate yesterday x2 -repeat HD again -improving 5.6 this am  2. DM -hypoglycemia last pm -skipped lunch yesterday, hold lantus, SSI  3. ESRD on HD with mild volume overload -improving with HD  4. COPD -stable, nebs PRN -smoking cessation  5. Thrombocytopenia -chronic, stable  6. Spiculated mass - h/o lung CA in 2003 treated with chemo and radiation  -Fu with Pulm outpt  7. CHronic pain -on oxycodone  8. Chronic abd pain -exam benign, tolerating all his food -check lipase, LFTs  9. Non compliance/manipulative behavior -continues to be a long term challenge  10. Suicidal ideation -continue 1:1 sitter -Tele psych consulted yesterday, recommended Psychiatric hospitalization -CSW following  DVT proph: heparin SQ  Code Status: Full Code Family Communication: none at bedside Disposition Plan: keep in ICU   Consultants:  Renal, called and d/w Dr.Befekadu  HPI/Subjective: Feels weak  Objective: Filed Vitals:   04/28/13 0600  BP: 118/50  Pulse:   Temp:   Resp: 11    Intake/Output Summary (Last 24 hours) at 04/28/13 0812 Last data filed at 04/28/13 0100  Gross per 24 hour  Intake    720 ml  Output   3300 ml  Net  -2580 ml   Filed Weights   04/27/13 0500 04/27/13 0940 04/28/13 0500  Weight: 70.3 kg (154 lb 15.7 oz) 70.4 kg (155 lb 3.3 oz) 70.1 kg (154 lb 8.7 oz)    Exam:   General:  AAOx3, no distress  Cardiovascular: S1S2/RRR  Respiratory: poor air movement, fine basilar crackles  Abdomen: soft, NT, BS present, ostomy noted  Musculoskeletal: no edema c/c   Data Reviewed: Basic Metabolic Panel:  Recent Labs Lab 04/26/13 1800 04/26/13 2008  04/27/13 0438 04/27/13 1800 04/28/13 0422  NA 135* 142 142 137 135*  K >7.7* 6.5* 6.9* 6.0* 5.6*  CL 98 101 102 96 94*  CO2 18* 20 19 28 27   GLUCOSE 222* 176* 134* 128* 140*  BUN 78* 79* 79* 26* 32*  CREATININE 9.91* 10.01* 10.74* 5.25* 6.42*  CALCIUM 8.1* 8.1* 7.8* 8.1* 7.9*  MG 2.8*  --   --   --   --   PHOS 8.0*  --   --   --   --    Liver Function Tests:  Recent Labs Lab 04/27/13 0438  AST 53*  ALT 69*  ALKPHOS 179*  BILITOT 0.2*  PROT 6.9  ALBUMIN 2.9*   No results found for this basename: LIPASE, AMYLASE,  in the last 168 hours No results found for this basename: AMMONIA,  in the last 168 hours CBC:  Recent Labs Lab 04/26/13 1800 04/27/13 0438  WBC 7.5 5.4  NEUTROABS 5.0  --   HGB 12.2* 10.3*  HCT 38.3* 31.9*  MCV 95.3 93.8  PLT 168 157   Cardiac Enzymes:  Recent Labs Lab 04/26/13 1800 04/26/13 1946 04/27/13 0044 04/27/13 0731  TROPONINI <0.30 <0.30 <0.30 <0.30   BNP (last 3 results)  Recent Labs  08/17/12 0746  PROBNP 3324.0*   CBG:  Recent Labs Lab 04/27/13 2117 04/28/13 0029 04/28/13 0050 04/28/13 0114 04/28/13 0742  GLUCAP 144* 39* 46* 104* 110*    Recent Results (from the past 240 hour(s))  MRSA PCR SCREENING     Status: None  Collection Time    04/26/13  9:30 PM      Result Value Ref Range Status   MRSA by PCR NEGATIVE  NEGATIVE Final   Comment:            The GeneXpert MRSA Assay (FDA     approved for NASAL specimens     only), is one component of a     comprehensive MRSA colonization     surveillance program. It is not     intended to diagnose MRSA     infection nor to guide or     monitor treatment for     MRSA infections.     Studies: Dg Chest Portable 1 View  04/26/2013   CLINICAL DATA:  Fatigue.  EXAM: PORTABLE CHEST - 1 VIEW  COMPARISON:  04/09/2013.  FINDINGS: A dialysis catheter is in stable position, tip at the upper right atrium.  No cardiomegaly for technique.  Unchanged mediastinal contours.  Diffuse  interstitial coarsening which is new from prior. Cephalized pulmonary blood flow. Small right pleural effusion.  Remote left-sided rib fractures.  No acute osseous findings.  IMPRESSION: Pulmonary edema and small right pleural effusion.   Electronically Signed   By: Jorje Guild M.D.   On: 04/26/2013 18:29    Scheduled Meds: . calcium acetate  1,334 mg Oral TID WC  . epoetin alfa  4,000 Units Intravenous Once per day on Mon Wed Fri  . feeding supplement (NEPRO CARB STEADY)  237 mL Oral TID WC  . folic acid  1 mg Oral Daily  . heparin  5,000 Units Subcutaneous 3 times per day  . insulin aspart  0-5 Units Subcutaneous QHS  . insulin aspart  0-9 Units Subcutaneous TID WC  . neomycin-bacitracin-polymyxin   Topical BID  . pantoprazole  40 mg Oral Daily  . sodium bicarbonate  50 mEq Intravenous Once  . thiamine  100 mg Oral Daily  . tiotropium  18 mcg Inhalation Daily   Continuous Infusions:  Antibiotics Given (last 72 hours)   None      Active Problems:   Diabetes mellitus   Hypertension   Chest pain at rest   ESRD on hemodialysis   Hyperkalemia    Time spent: 69min    Dent Plantz  [facility] Pager [phone]. If 7PM-7AM, please contact night-coverage at [email or web address], password [facility] 04/28/2013, 8:12 AM  LOS: 2 days

## 2013-04-28 NOTE — Progress Notes (Signed)
Subjective: Interval History: has complaints feeling weak and tired. Patient denies any difficulty breathing. Appetite is poor..  Objective: Vital signs in last 24 hours: Temp:  [97.6 F (36.4 C)-98.5 F (36.9 C)] 98.1 F (36.7 C) (03/26 0400) Pulse Rate:  [62-85] 62 (03/26 0300) Resp:  [8-22] 11 (03/26 0600) BP: (101-172)/(37-108) 118/50 mmHg (03/26 0600) SpO2:  [96 %-100 %] 96 % (03/26 0300) Weight:  [70.1 kg (154 lb 8.7 oz)-70.4 kg (155 lb 3.3 oz)] 70.1 kg (154 lb 8.7 oz) (03/26 0500) Weight change: 2.36 kg (5 lb 3.3 oz)  Intake/Output from previous day: 03/25 0701 - 03/26 0700 In: 720 [P.O.:720] Out: 3300  Intake/Output this shift:    General appearance: alert, cooperative and no distress Resp: clear to auscultation bilaterally Cardio: regular rate and rhythm, S1, S2 normal, no murmur, click, rub or gallop GI: soft, non-tender; bowel sounds normal; no masses,  no organomegaly Extremities: extremities normal, atraumatic, no cyanosis or edema  Lab Results:  Recent Labs  04/26/13 1800 04/27/13 0438  WBC 7.5 5.4  HGB 12.2* 10.3*  HCT 38.3* 31.9*  PLT 168 157   BMET:  Recent Labs  04/27/13 1800 04/28/13 0422  NA 137 135*  K 6.0* 5.6*  CL 96 94*  CO2 28 27  GLUCOSE 128* 140*  BUN 26* 32*  CREATININE 5.25* 6.42*  CALCIUM 8.1* 7.9*   No results found for this basename: PTH,  in the last 72 hours Iron Studies: No results found for this basename: IRON, TIBC, TRANSFERRIN, FERRITIN,  in the last 72 hours  Studies/Results: Dg Chest Portable 1 View  04/26/2013   CLINICAL DATA:  Fatigue.  EXAM: PORTABLE CHEST - 1 VIEW  COMPARISON:  04/09/2013.  FINDINGS: A dialysis catheter is in stable position, tip at the upper right atrium.  No cardiomegaly for technique.  Unchanged mediastinal contours.  Diffuse interstitial coarsening which is new from prior. Cephalized pulmonary blood flow. Small right pleural effusion.  Remote left-sided rib fractures.  No acute osseous  findings.  IMPRESSION: Pulmonary edema and small right pleural effusion.   Electronically Signed   By: Jorje Guild M.D.   On: 04/26/2013 18:29    I have reviewed the patient's current medications.  Assessment/Plan: Problem #1 hyperkalemia: His potassium has improved. He status post hemodialysis. Patient with recurrent problem of hyperkalemia. Patient is on 1k baths as outpatient inspite of that  that his potassium is not controlled.  Problem #2 end-stage renal disease Problem #3 anemia: His hemoglobin and hematocrit is range Problem #4 diabetes Problem #5 hypertension: His blood pressure is reasonably controlled Problem #6 history of depression Problem #7 history of a trial fibrillation his heart rate is controlled. Plan: We'll make arrangements for patient to get dialysis today. Because of uncontrolled hyperkalemia possibly would put him on Kayoxalate  between dialysis days. Have discussed with the patient but not sure whether he is going to be compliant with his medication or diet.   LOS: 2 days   Raeanne Deschler S 04/28/2013,8:23 AM

## 2013-04-28 NOTE — Progress Notes (Signed)
Pt a/o.vss. Calm and cooperative. Suicide sitter at the bedside. Saline lock intact. Report called to Val, LPN on dept 001. Pt will receive dialysis prior to transferring to room 308.

## 2013-04-28 NOTE — Progress Notes (Signed)
Pt c/o feeling bad. cbg checked reveals sugar of 39. Hypoglycemic protocol followed.  Recheck in 15 mins.

## 2013-04-28 NOTE — Progress Notes (Signed)
Recheck of cbg is 46.  1/2 amp d50 given.  Recheck in 15 mins again.

## 2013-04-28 NOTE — Clinical Social Work Note (Signed)
CSW spoke w Marlou Porch, [facility] assessment, re placement needs for patient.  [facility] aware that patient will need dialysis services along w psychiatric treatment, they will start bed search when patient is medically clear.  Edwyna Shell, LCSW Clinical Social Worker [phone])

## 2013-04-28 NOTE — Procedures (Signed)
   HEMODIALYSIS TREATMENT NOTE:  3.5 hour dialysis treatment ordered for hyperkalemia.  HD initiated without problems.  Pt calm, cooperative in recliner.  At 1505 pt received a disturbing phone call after which he asked to end dialysis session early/AMA.  Risks explained, but pt stated, "my brother in law was just in an accident.  He's unconscious, I've got to go."  Primary nurse, Radene Gunning, was called and notified of pt's status and of his intention to terminate HD early. Request was made for a staff member to come collect pt as this RN was unable to leave room due to another pt dialyzing at the same time.  HD was discontinued per policy, catheter was heparin-locked, capped, clamped.  Pt began pacing about the room, making other phone calls.  NTs arrived to escort patient to third floor at 1530.  Dr. Lowanda Foster was notified of early treatment termination  Total HD time:  2 hours 15 minutes UF removed:  832 cc  [place]. Joe Canipe, RN, CDN

## 2013-04-28 NOTE — Clinical Social Work Note (Signed)
CSW called by RN as pt is trying to leave floor. IVC paperwork completed and faxed to clerk of court. Police called by RN and have arrived. Confirmed receipt of paperwork by clerk of court office. [employer] on chart. [facility] notified of above.   Joe Vincent, [place]

## 2013-04-28 NOTE — Progress Notes (Signed)
UR chart review completed.  

## 2013-04-29 ENCOUNTER — Encounter (HOSPITAL_COMMUNITY): Payer: Self-pay | Admitting: Psychiatry

## 2013-04-29 DIAGNOSIS — F411 Generalized anxiety disorder: Secondary | ICD-10-CM

## 2013-04-29 DIAGNOSIS — F3289 Other specified depressive episodes: Secondary | ICD-10-CM

## 2013-04-29 DIAGNOSIS — F329 Major depressive disorder, single episode, unspecified: Secondary | ICD-10-CM

## 2013-04-29 LAB — PHOSPHORUS: Phosphorus: 6.1 mg/dL — ABNORMAL HIGH (ref 2.3–4.6)

## 2013-04-29 LAB — BASIC METABOLIC PANEL
BUN: 25 mg/dL — AB (ref 6–23)
CHLORIDE: 98 meq/L (ref 96–112)
CO2: 26 mEq/L (ref 19–32)
CREATININE: 6 mg/dL — AB (ref 0.50–1.35)
Calcium: 7.9 mg/dL — ABNORMAL LOW (ref 8.4–10.5)
GFR calc Af Amer: 11 mL/min — ABNORMAL LOW (ref >90)
GFR calc non Af Amer: 10 mL/min — ABNORMAL LOW (ref >90)
Glucose, Bld: 157 mg/dL — ABNORMAL HIGH (ref 70–99)
Potassium: 5.1 mEq/L (ref 3.7–5.3)
Sodium: 139 mEq/L (ref 137–147)

## 2013-04-29 LAB — GLUCOSE, CAPILLARY
GLUCOSE-CAPILLARY: 227 mg/dL — AB (ref 70–99)
Glucose-Capillary: 141 mg/dL — ABNORMAL HIGH (ref 70–99)

## 2013-04-29 MED ORDER — INSULIN GLARGINE 100 UNIT/ML ~~LOC~~ SOLN: 10.0000 [IU] | Freq: Every day | SUBCUTANEOUS | Status: AC

## 2013-04-29 MED ORDER — CALCIUM ACETATE 667 MG PO CAPS: 1334.0000 mg | ORAL_CAPSULE | Freq: Three times a day (TID) | ORAL | Status: AC

## 2013-04-29 NOTE — Progress Notes (Addendum)
Subjective: Interval History: has complaints feeling weak and tired. Patient denies any difficulty breathing. Appetite is better.  Objective: Vital signs in last 24 hours: Temp:  [97.9 F (36.6 C)-98.1 F (36.7 C)] 98 F (36.7 C) (03/27 0435) Pulse Rate:  [59-82] 73 (03/27 0435) Resp:  [11-22] 20 (03/27 0435) BP: (126-147)/(59-78) 126/66 mmHg (03/27 0435) SpO2:  [97 %-100 %] 97 % (03/27 0735) Weight:  [66.769 kg (147 lb 3.2 oz)-70.1 kg (154 lb 8.7 oz)] 66.769 kg (147 lb 3.2 oz) (03/27 0435) Weight change: -0.3 kg (-10.6 oz)  Intake/Output from previous day: 03/26 0701 - 03/27 0700 In: -  Out: 832  Intake/Output this shift:    General appearance: alert, cooperative and no distress Resp: clear to auscultation bilaterally Cardio: regular rate and rhythm, S1, S2 normal, no murmur, click, rub or gallop GI: soft, non-tender; bowel sounds normal; no masses,  no organomegaly Extremities: extremities normal, atraumatic, no cyanosis or edema  Lab Results:  Recent Labs  04/26/13 1800 04/27/13 0438  WBC 7.5 5.4  HGB 12.2* 10.3*  HCT 38.3* 31.9*  PLT 168 157   BMET:   Recent Labs  04/28/13 1927 04/29/13 0506  NA 138 139  K 5.0 5.1  CL 96 98  CO2 30 26  GLUCOSE 193* 157*  BUN 20 25*  CREATININE 5.14* 6.00*  CALCIUM 8.3* 7.9*   No results found for this basename: PTH,  in the last 72 hours Iron Studies: No results found for this basename: IRON, TIBC, TRANSFERRIN, FERRITIN,  in the last 72 hours  Studies/Results: No results found.  I have reviewed the patient's current medications.  Assessment/Plan: Problem #1 hyperkalemia: His potassium has improved. He status post hemodialysis daily for the last 2 days. His potassium has corrected.  Problem #2 end-stage renal disease Problem #3 anemia: His hemoglobin and hematocrit is range Problem #4 diabetes Problem #5 hypertension: His blood pressure is reasonably controlled Problem #6 history of depression Problem #7  history of a trial fibrillation his heart rate is controlled. Problem #8 metabolic bone disease: His calcium is in range however his phosphorus is high. Her presently patient is on PhosLo. Plan: We'll make arrangements for patient tomorrow. We'll check his basic metabolic panel in the morning.   LOS: 3 days   Peirce Deveney S 04/29/2013,8:21 AM

## 2013-04-29 NOTE — Progress Notes (Signed)
Pt medically stable for Transfer to Med-Psych facility when bed available  Domenic Polite, MD [phone]

## 2013-04-29 NOTE — Clinical Social Work Note (Signed)
Notified [place] at [facility] that pt has been medically cleared for transfer.  Benay Pike, [place]

## 2013-04-29 NOTE — Progress Notes (Signed)
Patient was cleared by psychiatry and suicide sitter was d/cd at 1450.  Patient calling ride to come pick him up, instructed patient to wait for d/c instructions.  Went back to room at this time with d/c papers to d.c home and patient had already left without papers.  SW had instructed RN to call brother and ensure patient was not going to have access to guns at home. Brother's number not on file, and patient had already left prior to getting number from him. [place], Eulis Canner 04/29/2013

## 2013-04-29 NOTE — Progress Notes (Signed)
Patient cleared by psychiatry per SW. MD notified and stated patient could come off of suicide precautions.  Patient notified, sitter dc'd.  Will continue to monitor pt. Schonewitz, Eulis Canner 04/29/2013

## 2013-04-29 NOTE — Clinical Social Work Psychosocial (Signed)
Clinical Social Work Department BRIEF PSYCHOSOCIAL ASSESSMENT 04/29/2013  Patient:  Joe Vincent, Joe Vincent     Account Number:  [id number]     Admit date:  04/26/2013  Clinical Social Worker:  Joe Vincent, [place]  Date/Time:  04/29/2013 03:00 PM  Referred by:  CSW  Date Referred:  04/29/2013 Referred for  Behavioral Health Issues   Other Referral:   Interview type:  Patient Other interview type:    PSYCHOSOCIAL DATA Living Status:  ALONE Admitted from facility:   Level of care:   Primary support name:  Joe Vincent, Joe Vincent Primary support relationship to patient:  SIBLING Degree of support available:   Patient reports he has limited support from brother and friends in area, gets help w transport to dialysis and appointments  in particular    CURRENT CONCERNS Current Concerns  Joe Vincent   Other Concerns:    SOCIAL WORK ASSESSMENT / PLAN CSW met w patient at bedside, patient alert and oriented x4.  Patient has ESRD, was admitted for medical issues as well as for voicing suicidal intent w plan while undergoing dialysis outpatient.  Patient was in ICU, had teleassessment which advised that he needed voluntary psychiatric admission.  Patient agreed to bed search for inpatient psychiatry bed.  Patient was made IVC yesterday when he attempted to leave medical floor due to his suicidality and unwillingness to remain in hospital.    CSW received report that patient needed resources to address outpatient counseling needs.  CSW discussed this w patient - patient only has Medicare at present, cannot get help from [facility] as they do not take Medicare.  Discussed other options w patient, he declined other referrals due to the cost of copay associated w using his [place].  Says he already has $20000 in medical bills and cannot afford any additional bills.  Has [place] to pay for his dialysis expenses only.    Patient is disabled, receives disability income,  used to work on [employer] prior to becoming too ill to work.  Has family in [place], [place] and here in [place].  Patient gets dialysis T[facility]at at [place], either brother or apartment manager drives him to these appointments.  Patient expressed frustration and discouragement w his current situation.  Says he makes too much money to qualify for Medicaid half the year, but after he pays his expenses (rent, car insurance and car payment), he has little money left over for food and medical expenses.  Does not qualify for Food Stamps.  Says he has tried many different options to address his needs, but has not found available resources.  Exasperated that rules in [place] have changed such that his income is now too high for [place] and too low to afford what he needs.    CSW attempted to find outpatient counseling referral for patient - Chinita Pester will not see him because he has [place] and other practices that accept Medicare will require a copay which he says he cannot afford.  CSW validated his efforts at solving his resource needs and the frustration he is experiencing.  Patient stated he is nt suicidal, simply frustrated with having to spend 3 days/week in dialysis and not being able to find resources for things he needs.  Wants to move closer to other family members, says he has brother in [place] who is wiling to donate a kidney to him, but dialysis social worker has not contacted brother to discuss details of donation.  Patient wants to save enough  money to get to [place] or [place] where he thinks more resources are available and income limits are higher.  CSW gave him [facility], suicide prevention hotline number and contact information for [employer] where social workers can give information about resources in other states.   Assessment/plan status:  No Further Intervention Required Other assessment/ plan:   Information/referral to community resources:    [employer]  Suicide prevention hotline  [facility]    PATIENT'S/FAMILY'S RESPONSE TO PLAN OF CARE: Patient appreciative of resources given and effort to find concrete help for patient.  Declined referral to outpatient counseling as he cannot afford copays.        Joe Shell, LCSW Clinical Social Worker [phone])

## 2013-04-29 NOTE — Discharge Summary (Signed)
Physician Discharge Summary  Joe Vincent [id number] DOB: 12-23-1960 DOA: 04/26/2013  PCP: Curlene Labrum, MD  Admit date: 04/26/2013 Discharge date: 04/29/2013  Time spent: 45 minutes  Recommendations for Outpatient Follow-up:  1. PCP in 1 week 2. Psychiatric resources in community 3.   FU with pulm for evaluation of lung mass 4. Renal/low K diet  And compliance with HD should be re-emphasized  Discharge Diagnoses:    Diabetes mellitus   Hypertension   Chronic pain    ESRD on hemodialysis   Hyperkalemia   H/o COlon CA   s/p Ileostomy   Non compliance   Spiculated lung mass   COPD   Thrombocytopenia   Suicidal ideation  Discharge Condition: improved  Diet recommendation: Renal Diet  Filed Weights   04/28/13 0500 04/28/13 1240 04/29/13 0435  Weight: 70.1 kg (154 lb 8.7 oz) 70.1 kg (154 lb 8.7 oz) 66.769 kg (147 lb 3.2 oz)    History of present illness:  Joe Vincent is a 53 y.o. male  This 53 year old man, who has end-stage renal disease and is on hemodialysis 3 times a week, presents with nonspecific symptoms but mainly include dyspnea and chest pain as well as hurting all over. When he was seen in the emergency room, he was found to have a potassium of 7.7 and chest x-ray was consistent with fluid overload. The emergency room physician has given him medications to help his potassium emergently but clearly this man requires dialysis. The patient has been given sodium bicarbonate, calcium gluconate as well as dextrose. He remains alert and oriented. He feels extremely fatigued.   Hospital Course:  1. Hyperkalemia -? Poor compliance with Renal Diet, NSAID use, missed HD 3/24  -Renal Consulted, Dr.Befekadu following  -Given repeat Ca gluconate, Insulin, kayexalate 3/24 and 3/25  -had 2 extra sessions of HD -resolved -compliance with Diet and HD re-emphasized  2. DM  -resumed lantus at lower dose and SSI  3. ESRD on HD with mild volume overload  -improved with HD    4. COPD  -stable, nebs PRN  -smoking cessation counseled  5. Thrombocytopenia  -chronic, stable, likely due to liver disease  6. Spiculated mass - h/o lung CA in 2003 treated with chemo and radiation  -Fu with Pulm outpt   7. CHronic pain  -on oxycodone, referred to pain clinic per PCP  8. Chronic abd pain  -exam benign, tolerating all his food   9. Non compliance/manipulative behavior  -continues to be a long term challenge   10. Suicidal ideation  -on 3/25 he mentioned suicidal ideation to his Dialysis RN, hence sitter was obtained and Tele psych consulted, the initially recommended Psychiatric hospitalization, when they reassessed him in 48hours  They felt it was ok to discharge him home with community support. Seen by social workers to help from this standpoint  11. Severe peripheral neuropathy  -has had work up as outpt including MRIs and f/u with neurology (per patient)  -Vitamin B12 675   12. Hep C -on no meds   Consultations:  Renal  Telepsychiatry  Discharge Exam: Filed Vitals:   04/29/13 0435  BP: 126/66  Pulse: 73  Temp: 98 F (36.7 C)  Resp: 20    General: AAOx3 Cardiovascular: S1S2/RRR Respiratory: CTAB  Discharge Instructions  Discharge Orders   Future Appointments Provider Department Dept Phone   05/04/2013 3:00 PM Kathee Delton, MD [facility] [phone]   05/20/2013 8:30 AM Conrad Lealman, MD [facility] [phone]  Future Orders Complete By Expires   Discharge instructions  As directed    Comments:     Renal Diet   Increase activity slowly  As directed        Medication List    STOP taking these medications       naproxen 500 MG tablet  Commonly known as:  NAPROSYN      TAKE these medications       calcium acetate 667 MG capsule  Commonly known as:  PHOSLO  Take 2 capsules (1,334 mg total) by mouth 3 (three) times daily with meals.     camphor-menthol lotion  Commonly known  as:  SARNA  Apply topically as needed for itching.     feeding supplement (NEPRO CARB STEADY) Liqd  Take 237 mLs by mouth 3 (three) times daily with meals.     folic acid 1 MG tablet  Commonly known as:  FOLVITE  Take 1 mg by mouth daily.     insulin aspart 100 UNIT/ML injection  Commonly known as:  novoLOG  Inject 4-12 Units into the skin 3 (three) times daily as needed for high blood sugar (Uses a sliding scale at home).     insulin glargine 100 UNIT/ML injection  Commonly known as:  LANTUS  Inject 0.1 mLs (10 Units total) into the skin at bedtime.     ipratropium-albuterol 0.5-2.5 (3) MG/3ML Soln  Commonly known as:  DUONEB  Take 3 mLs by nebulization every 6 (six) hours as needed.     oxycodone 30 MG immediate release tablet  Commonly known as:  ROXICODONE  Take 30 mg by mouth every 8 (eight) hours as needed for pain.     pantoprazole 40 MG tablet  Commonly known as:  PROTONIX  Take 40 mg by mouth daily.     SPIRIVA HANDIHALER 18 MCG inhalation capsule  Generic drug:  tiotropium  Place 18 mcg into inhaler and inhale daily.     thiamine 100 MG tablet  Commonly known as:  VITAMIN B-1  Take 100 mg by mouth daily.       Allergies  Allergen Reactions  . Codeine Swelling and Rash  . Kadian [Morphine Sulfate Er] Itching  . Prednisone Other (See Comments)    unknown       Follow-up Information   Follow up with Curlene Labrum, MD. Schedule an appointment as soon as possible for a visit in 1 week.   Contact information:   [address] [place] [address] [phone]        The results of significant diagnostics from this hospitalization (including imaging, microbiology, ancillary and laboratory) are listed below for reference.    Significant Diagnostic Studies: Dg Chest 2 View  04/05/2013   CLINICAL DATA:  Leukocytosis and dialysis patient  EXAM: CHEST  2 VIEW  COMPARISON:  CT ANGIO CHEST W/CM &/OR WO/CM dated 04/02/2013; DG CHEST 1V PORT dated 04/01/2013   FINDINGS: Since the previous chest x-ray there have developed confluent alveolar densities throughout much of the right lung. This is predominantly in the upper lobe. There is a tiny right pleural effusion. The left lung is well-expanded and largely clear. There is density overlying the posterior aspects of the fourth and fifth ribs on the left which may reflect a confluence of densities but also could reflect early pneumonia. The cardiac silhouette is normal in size. The pulmonary vascularity is not engorged. The dual-lumen large caliber dialysis type catheter appears unchanged.  IMPRESSION: 1. The findings are  consistent with progressive pneumonia predominantly in the right upper lobe. A tiny right pleural effusion is suspected. These findings are new since the previous studies. 2. There is parenchymal density projecting in the left upper lobe which may be artifactual, but developing pneumonia here is not excluded. 3. There is no evidence of CHF.   Electronically Signed   By: David  Martinique   On: 04/05/2013 15:13   Ct Angio Chest Pe W/cm &/or Wo Cm  04/02/2013   CLINICAL DATA:  Atypical chest pain, for 1 month and worse today with pleuritic pain  EXAM: CT ANGIOGRAPHY CHEST WITH CONTRAST  TECHNIQUE: Multidetector CT imaging of the chest was performed using the standard protocol during bolus administration of intravenous contrast. Multiplanar CT image reconstructions and MIPs were obtained to evaluate the vascular anatomy.  CONTRAST:  29mL OMNIPAQUE IOHEXOL 350 MG/ML SOLN  COMPARISON:  DG CHEST 1V PORT dated 04/01/2013; CT ANGIO CHEST dated 07/03/2012  FINDINGS: Thoracic aorta is not dilated. There are no filling defects in the pulmonary arterial system. There is no pericardial or pleural effusion. There is coronary arterial calcification. There are numerous small calcified lymph nodes within the mediastinum suggesting prior granulomatous exposure. There is an 8 mm calcified left upper lobe lung nodule consistent  with a granuloma. Scans through the upper abdomen are unremarkable. There are no acute musculoskeletal findings.  Spiculated scarring in the left lung apex is stable. There is spiculated nodular density based against the posterior pleural surface in the right lung base. Previously it measured 11 x 14 mm, and currently it measures 13 x 19 mm. It was previously shown to be non tracer avid on PET scan. There is a bilobed nodular opacity in the anterior inferior right middle lobe. Components of this measure 17 x 9 and 13 x 14 mm as compared to 7 x 10 and 7 x 12 mm previously.  Review of the MIP images confirms the above findings.  IMPRESSION: 1. No evidence of pulmonary embolism 2. Although previously non FDG avid, there are progressively enlarging spiculated masslike lesions in the right lung as described above. These may represent progressive fibrotic change related to prior granulomatous exposure but neoplasm is not excluded. Percutaneous biopsy or PET-CT scan suggested. These results will be called to the ordering clinician or representative by the Radiologist Assistant, and communication documented in the PACS Dashboard.   Electronically Signed   By: Skipper Cliche M.D.   On: 04/02/2013 09:18   Dg Chest Portable 1 View  04/26/2013   CLINICAL DATA:  Fatigue.  EXAM: PORTABLE CHEST - 1 VIEW  COMPARISON:  04/09/2013.  FINDINGS: A dialysis catheter is in stable position, tip at the upper right atrium.  No cardiomegaly for technique.  Unchanged mediastinal contours.  Diffuse interstitial coarsening which is new from prior. Cephalized pulmonary blood flow. Small right pleural effusion.  Remote left-sided rib fractures.  No acute osseous findings.  IMPRESSION: Pulmonary edema and small right pleural effusion.   Electronically Signed   By: Jorje Guild M.D.   On: 04/26/2013 18:29   Dg Chest Port 1 View  04/09/2013   CLINICAL DATA:  Respiratory failure  EXAM: PORTABLE CHEST - 1 VIEW  COMPARISON:  04/08/2013   FINDINGS: There is a left chest wall dialysis catheter with tips in the right atrium. The heart size is normal. Airspace consolidation within the right upper lobe appears improved from previous exam.  IMPRESSION: 1. Improving aeration to the right upper lobe.   Electronically Signed  By: Kerby Moors M.D.   On: 04/09/2013 08:47   Dg Chest Port 1 View  04/08/2013   CLINICAL DATA:  Pneumonia, shortness of breath  EXAM: PORTABLE CHEST - 1 VIEW  COMPARISON:  DG CHEST 2 VIEW dated 04/05/2013; DG CHEST 1V PORT dated 07/07/2012; CT ANGIO CHEST W/CM &/OR WO/CM dated 04/02/2013  FINDINGS: There is dense right upper lobe consolidation. There is hazy right lower lobe airspace disease. The left lung is clear. There is no pleural effusion or pneumothorax. Previously demonstrated right basilar nodular opacity is not well visualized on the current exam. Stable cardiomediastinal silhouette. There is a dilute min left-sided central venous catheter in satisfactory position. There is a nondisplaced healing/healed fracture of the left posterior eighth rib.  IMPRESSION: Right upper lobe pneumonia. Hazy right lower lobe airspace disease likely representing persistent amount air which is improved compared with 04/05/2013. Recommend followup radiography in 4-6 weeks, to document complete resolution following adequate medical therapy. If there is not complete resolution, then recommend further evaluation with CT of the chest to exclude underlying pathology.   Electronically Signed   By: Kathreen Devoid   On: 04/08/2013 08:44   Dg Chest Port 1 View  04/01/2013   CLINICAL DATA:  Smoker.  Chest pain.  EXAM: PORTABLE CHEST - 1 VIEW  COMPARISON:  DG CHEST 2 VIEW dated 08/17/2012  FINDINGS: Large bore dual-lumen catheter noted with tip projected over right atrium. Lungs are clear. No pleural effusion or pneumothorax. Heart size normal. Pacing pads noted over the chest.  IMPRESSION: 1. No acute cardiopulmonary disease. 2. Dialysis catheter noted in  good anatomic position.   Electronically Signed   By: Marcello Moores  Register   On: 04/01/2013 13:30    Microbiology: Recent Results (from the past 240 hour(s))  MRSA PCR SCREENING     Status: None   Collection Time    04/26/13  9:30 PM      Result Value Ref Range Status   MRSA by PCR NEGATIVE  NEGATIVE Final   Comment:            The GeneXpert MRSA Assay (FDA     approved for NASAL specimens     only), is one component of a     comprehensive MRSA colonization     surveillance program. It is not     intended to diagnose MRSA     infection nor to guide or     monitor treatment for     MRSA infections.     Labs: Basic Metabolic Panel:  Recent Labs Lab 04/26/13 1800  04/27/13 0438 04/27/13 1800 04/28/13 0422 04/28/13 1927 04/29/13 0506  NA 135*  < > 142 137 135* 138 139  K >7.7*  < > 6.9* 6.0* 5.6* 5.0 5.1  CL 98  < > 102 96 94* 96 98  CO2 18*  < > 19 28 27 30 26   GLUCOSE 222*  < > 134* 128* 140* 193* 157*  BUN 78*  < > 79* 26* 32* 20 25*  CREATININE 9.91*  < > 10.74* 5.25* 6.42* 5.14* 6.00*  CALCIUM 8.1*  < > 7.8* 8.1* 7.9* 8.3* 7.9*  MG 2.8*  --   --   --   --   --   --   PHOS 8.0*  --   --   --   --   --  6.1*  < > = values in this interval not displayed. Liver Function Tests:  Recent Labs Lab 04/27/13 [phone]  04/28/13 0422  AST 53* 48*  ALT 69* 66*  ALKPHOS 179* 188*  BILITOT 0.2* 0.2*  PROT 6.9 7.6  ALBUMIN 2.9* 3.1*    Recent Labs Lab 04/28/13 0422  LIPASE 32   No results found for this basename: AMMONIA,  in the last 168 hours CBC:  Recent Labs Lab 04/26/13 1800 04/27/13 0438  WBC 7.5 5.4  NEUTROABS 5.0  --   HGB 12.2* 10.3*  HCT 38.3* 31.9*  MCV 95.3 93.8  PLT 168 157   Cardiac Enzymes:  Recent Labs Lab 04/26/13 1800 04/26/13 1946 04/27/13 0044 04/27/13 0731  TROPONINI <0.30 <0.30 <0.30 <0.30   BNP: BNP (last 3 results)  Recent Labs  08/17/12 0746  PROBNP 3324.0*   CBG:  Recent Labs Lab 04/28/13 1110 04/28/13 1649  04/28/13 2105 04/29/13 0749 04/29/13 1156  GLUCAP 175* 143* 168* 141* 227*       Signed:  Ty Oshima  [facility] 04/29/2013, 2:29 PM

## 2013-04-29 NOTE — Consult Note (Signed)
Telepsych Consultation   Reason for Consult:  Depression Referring Physician:  ED MD Dayveon Halley is an 53 y.o. male.  Assessment: AXIS I:  Anxiety Disorder NOS and Depressive Disorder NOS AXIS II:  Deferred AXIS III:   Past Medical History  Diagnosis Date  . Diabetes mellitus   . Hypertension   . ESRD on hemodialysis     On HD in [place] at [facility].  Nephrologist is Dr. Hinda Lenis. Gets HD on TTS schedule.   [name] Retinopathy   . Peripheral neuropathy   . Chest pain     a. 05/2011 Cath: nonobs dzs.  . Myocardial infarction 2000  . Depression   . Liver disease   . Cancer     Colon  . GERD (gastroesophageal reflux disease)   . CAD (coronary artery disease)   . Anxiety   . Esophagitis 2013    and gastritis by EGD  . OA (osteoarthritis)     Hx: of   AXIS IV:  problems with access to health care services AXIS V:  61-70 mild symptoms  Plan:  No evidence of imminent risk to self or others at present.  Dr. Darleene Cleaver consulted and reviewed the patient; concurs with the treatment plan.  Subjective:   Ibrahima Cress is a 53 y.o. male patient does not warrant admisson.  HPI:  Patient was at dialysis today and when the nurse asked how he was, he commented that he is frustrated with having his life on hold because of dialysis and has had fleeting thoughts of not being here.  Mr. Kawai stated he would never had said it, if he knew the ramifications of it, especially since he did not mean it.  Patient has had one previous inpatient hospitalization for chronic pain issues that were not controlled at the time and he was depressed.  His pain is controlled at this time and he does not have these feelings, concerns, or issues.  He lives alone but has supportive brothers and two adult children.  Mr. Luis denies suicidal/homicidal ideations and hallucinations. He has no guns in the home and has no problems with [place] talking to his brother about any safety concerns when he comes to take him home.  Mr.  Sawa feels safe going home and no concerns at this time. HPI Elements:  Brief, acute, frustrated with dialysis schedule, mild  Past Psychiatric History: Past Medical History  Diagnosis Date  . Diabetes mellitus   . Hypertension   . ESRD on hemodialysis     On HD in [place] at [facility].  Nephrologist is Dr. Hinda Lenis. Gets HD on TTS schedule.   [name] Retinopathy   . Peripheral neuropathy   . Chest pain     a. 05/2011 Cath: nonobs dzs.  . Myocardial infarction 2000  . Depression   . Liver disease   . Cancer     Colon  . GERD (gastroesophageal reflux disease)   . CAD (coronary artery disease)   . Anxiety   . Esophagitis 2013    and gastritis by EGD  . OA (osteoarthritis)     Hx: of    reports that he has been smoking Cigarettes.  He has a 17.5 pack-year smoking history. He has quit using smokeless tobacco. He reports that he does not drink alcohol or use illicit drugs. Family History  Problem Relation Age of Onset  . Cancer Mother   . Heart disease Father   . Cancer Father    Family History Substance Abuse: No  Family Supports: Yes, List: Audiological scientist) Living Arrangements: Alone Can pt return to current living arrangement?: Yes Allergies:   Allergies  Allergen Reactions  . Codeine Swelling and Rash  . Kadian [Morphine Sulfate Er] Itching  . Prednisone Other (See Comments)    unknown    ACT Assessment Complete:  Yes:     Objective: Blood pressure 126/66, pulse 73, temperature 98 F (36.7 C), temperature source Oral, resp. rate 20, height 6' (1.829 m), weight 147 lb 3.2 oz (66.769 kg), SpO2 97.00%.Body mass index is 19.96 kg/(m^2). Results for orders placed during the hospital encounter of 04/26/13 (from the past 72 hour(s))  CBC WITH DIFFERENTIAL     Status: Abnormal   Collection Time    04/26/13  6:00 PM      Result Value Ref Range   WBC 7.5  4.0 - 10.5 K/uL   RBC 4.02 (*) 4.22 - 5.81 MIL/uL   Hemoglobin 12.2 (*) 13.0 - 17.0 g/dL   HCT 38.3 (*) 39.0 - 52.0 %    MCV 95.3  78.0 - 100.0 fL   MCH 30.3  26.0 - 34.0 pg   MCHC 31.9  30.0 - 36.0 g/dL   RDW 15.2  11.5 - 15.5 %   Platelets 168  150 - 400 K/uL   Neutrophils Relative % 67  43 - 77 %   Neutro Abs 5.0  1.7 - 7.7 K/uL   Lymphocytes Relative 20  12 - 46 %   Lymphs Abs 1.5  0.7 - 4.0 K/uL   Monocytes Relative 11  3 - 12 %   Monocytes Absolute 0.8  0.1 - 1.0 K/uL   Eosinophils Relative 1  0 - 5 %   Eosinophils Absolute 0.0  0.0 - 0.7 K/uL   Basophils Relative 1  0 - 1 %   Basophils Absolute 0.1  0.0 - 0.1 K/uL  BASIC METABOLIC PANEL     Status: Abnormal   Collection Time    04/26/13  6:00 PM      Result Value Ref Range   Sodium 135 (*) 137 - 147 mEq/L   Potassium >7.7 (*) 3.7 - 5.3 mEq/L   Comment: CRITICAL RESULT CALLED TO, READ BACK BY AND VERIFIED WITH:     YOUNGER,J ON 04/26/13 AT 1840 BY LOY,C   Chloride 98  96 - 112 mEq/L   CO2 18 (*) 19 - 32 mEq/L   Glucose, Bld 222 (*) 70 - 99 mg/dL   BUN 78 (*) 6 - 23 mg/dL   Creatinine, Ser 9.91 (*) 0.50 - 1.35 mg/dL   Calcium 8.1 (*) 8.4 - 10.5 mg/dL   GFR calc non Af Amer 5 (*) >90 mL/min   GFR calc Af Amer 6 (*) >90 mL/min   Comment: (NOTE)     The eGFR has been calculated using the CKD EPI equation.     This calculation has not been validated in all clinical situations.     eGFR's persistently <90 mL/min signify possible Chronic Kidney     Disease.  MAGNESIUM     Status: Abnormal   Collection Time    04/26/13  6:00 PM      Result Value Ref Range   Magnesium 2.8 (*) 1.5 - 2.5 mg/dL  PHOSPHORUS     Status: Abnormal   Collection Time    04/26/13  6:00 PM      Result Value Ref Range   Phosphorus 8.0 (*) 2.3 - 4.6 mg/dL  TROPONIN I  Status: None   Collection Time    04/26/13  6:00 PM      Result Value Ref Range   Troponin I <0.30  <0.30 ng/mL   Comment:            Due to the release kinetics of cTnI,     a negative result within the first hours     of the onset of symptoms does not rule out     myocardial infarction with  certainty.     If myocardial infarction is still suspected,     repeat the test at appropriate intervals.  TROPONIN I     Status: None   Collection Time    04/26/13  7:46 PM      Result Value Ref Range   Troponin I <0.30  <0.30 ng/mL   Comment:            Due to the release kinetics of cTnI,     a negative result within the first hours     of the onset of symptoms does not rule out     myocardial infarction with certainty.     If myocardial infarction is still suspected,     repeat the test at appropriate intervals.  CBG MONITORING, ED     Status: Abnormal   Collection Time    04/26/13  8:07 PM      Result Value Ref Range   Glucose-Capillary 132 (*) 70 - 99 mg/dL  BASIC METABOLIC PANEL     Status: Abnormal   Collection Time    04/26/13  8:08 PM      Result Value Ref Range   Sodium 142  137 - 147 mEq/L   Comment: DELTA CHECK NOTED   Potassium 6.5 (*) 3.7 - 5.3 mEq/L   Comment: DELTA CHECK NOTED     CRITICAL RESULT CALLED TO, READ BACK BY AND VERIFIED WITH:     Linna Darner T AT 2040 ON [id number] BY FORSYTH K   Chloride 101  96 - 112 mEq/L   CO2 20  19 - 32 mEq/L   Glucose, Bld 176 (*) 70 - 99 mg/dL   BUN 79 (*) 6 - 23 mg/dL   Creatinine, Ser 10.01 (*) 0.50 - 1.35 mg/dL   Calcium 8.1 (*) 8.4 - 10.5 mg/dL   GFR calc non Af Amer 5 (*) >90 mL/min   GFR calc Af Amer 6 (*) >90 mL/min   Comment: (NOTE)     The eGFR has been calculated using the CKD EPI equation.     This calculation has not been validated in all clinical situations.     eGFR's persistently <90 mL/min signify possible Chronic Kidney     Disease.  MRSA PCR SCREENING     Status: None   Collection Time    04/26/13  9:30 PM      Result Value Ref Range   MRSA by PCR NEGATIVE  NEGATIVE   Comment:            The GeneXpert MRSA Assay (FDA     approved for NASAL specimens     only), is one component of a     comprehensive MRSA colonization     surveillance program. It is not     intended to diagnose MRSA     infection nor  to guide or     monitor treatment for     MRSA infections.  GLUCOSE, CAPILLARY     Status: None   Collection  Time    04/26/13  9:39 PM      Result Value Ref Range   Glucose-Capillary 96  70 - 99 mg/dL  TROPONIN I     Status: None   Collection Time    04/27/13 12:44 AM      Result Value Ref Range   Troponin I <0.30  <0.30 ng/mL   Comment:            Due to the release kinetics of cTnI,     a negative result within the first hours     of the onset of symptoms does not rule out     myocardial infarction with certainty.     If myocardial infarction is still suspected,     repeat the test at appropriate intervals.  COMPREHENSIVE METABOLIC PANEL     Status: Abnormal   Collection Time    04/27/13  4:38 AM      Result Value Ref Range   Sodium 142  137 - 147 mEq/L   Potassium 6.9 (*) 3.7 - 5.3 mEq/L   Comment: CRITICAL RESULT CALLED TO, READ BACK BY AND VERIFIED WITH:     FRINK,L AT 6:30AM ON 04/27/13 BY FESTERMAN,C   Chloride 102  96 - 112 mEq/L   CO2 19  19 - 32 mEq/L   Glucose, Bld 134 (*) 70 - 99 mg/dL   BUN 79 (*) 6 - 23 mg/dL   Creatinine, Ser 10.74 (*) 0.50 - 1.35 mg/dL   Calcium 7.8 (*) 8.4 - 10.5 mg/dL   Total Protein 6.9  6.0 - 8.3 g/dL   Albumin 2.9 (*) 3.5 - 5.2 g/dL   AST 53 (*) 0 - 37 U/L   ALT 69 (*) 0 - 53 U/L   Alkaline Phosphatase 179 (*) 39 - 117 U/L   Total Bilirubin 0.2 (*) 0.3 - 1.2 mg/dL   GFR calc non Af Amer 5 (*) >90 mL/min   GFR calc Af Amer 6 (*) >90 mL/min   Comment: (NOTE)     The eGFR has been calculated using the CKD EPI equation.     This calculation has not been validated in all clinical situations.     eGFR's persistently <90 mL/min signify possible Chronic Kidney     Disease.  CBC     Status: Abnormal   Collection Time    04/27/13  4:38 AM      Result Value Ref Range   WBC 5.4  4.0 - 10.5 K/uL   RBC 3.40 (*) 4.22 - 5.81 MIL/uL   Hemoglobin 10.3 (*) 13.0 - 17.0 g/dL   HCT 31.9 (*) 39.0 - 52.0 %   MCV 93.8  78.0 - 100.0 fL   MCH 30.3   26.0 - 34.0 pg   MCHC 32.3  30.0 - 36.0 g/dL   RDW 15.2  11.5 - 15.5 %   Platelets 157  150 - 400 K/uL  GLUCOSE, CAPILLARY     Status: Abnormal   Collection Time    04/27/13  7:22 AM      Result Value Ref Range   Glucose-Capillary 142 (*) 70 - 99 mg/dL  TROPONIN I     Status: None   Collection Time    04/27/13  7:31 AM      Result Value Ref Range   Troponin I <0.30  <0.30 ng/mL   Comment:            Due to the release kinetics of cTnI,  a negative result within the first hours     of the onset of symptoms does not rule out     myocardial infarction with certainty.     If myocardial infarction is still suspected,     repeat the test at appropriate intervals.  HEPATITIS B SURFACE ANTIGEN     Status: None   Collection Time    04/27/13 10:10 AM      Result Value Ref Range   Hepatitis B Surface Ag NEGATIVE  NEGATIVE   Comment: Performed at [facility], CAPILLARY     Status: Abnormal   Collection Time    04/27/13 11:23 AM      Result Value Ref Range   Glucose-Capillary 104 (*) 70 - 99 mg/dL  GLUCOSE, CAPILLARY     Status: Abnormal   Collection Time    04/27/13  4:17 PM      Result Value Ref Range   Glucose-Capillary 101 (*) 70 - 99 mg/dL  BASIC METABOLIC PANEL     Status: Abnormal   Collection Time    04/27/13  6:00 PM      Result Value Ref Range   Sodium 137  137 - 147 mEq/L   Potassium 6.0 (*) 3.7 - 5.3 mEq/L   Chloride 96  96 - 112 mEq/L   CO2 28  19 - 32 mEq/L   Glucose, Bld 128 (*) 70 - 99 mg/dL   BUN 26 (*) 6 - 23 mg/dL   Comment: DELTA CHECK NOTED   Creatinine, Ser 5.25 (*) 0.50 - 1.35 mg/dL   Comment: DELTA CHECK NOTED   Calcium 8.1 (*) 8.4 - 10.5 mg/dL   GFR calc non Af Amer 11 (*) >90 mL/min   GFR calc Af Amer 13 (*) >90 mL/min   Comment: (NOTE)     The eGFR has been calculated using the CKD EPI equation.     This calculation has not been validated in all clinical situations.     eGFR's persistently <90 mL/min signify possible Chronic  Kidney     Disease.  GLUCOSE, CAPILLARY     Status: Abnormal   Collection Time    04/27/13  9:17 PM      Result Value Ref Range   Glucose-Capillary 144 (*) 70 - 99 mg/dL   Comment 1 Notify RN    GLUCOSE, CAPILLARY     Status: Abnormal   Collection Time    04/28/13 12:29 AM      Result Value Ref Range   Glucose-Capillary 39 (*) 70 - 99 mg/dL  GLUCOSE, CAPILLARY     Status: Abnormal   Collection Time    04/28/13 12:50 AM      Result Value Ref Range   Glucose-Capillary 46 (*) 70 - 99 mg/dL   Comment 1 Notify RN    GLUCOSE, CAPILLARY     Status: Abnormal   Collection Time    04/28/13  1:14 AM      Result Value Ref Range   Glucose-Capillary 104 (*) 70 - 99 mg/dL   Comment 1 Notify RN    BASIC METABOLIC PANEL     Status: Abnormal   Collection Time    04/28/13  4:22 AM      Result Value Ref Range   Sodium 135 (*) 137 - 147 mEq/L   Potassium 5.6 (*) 3.7 - 5.3 mEq/L   Chloride 94 (*) 96 - 112 mEq/L   CO2 27  19 - 32 mEq/L   Glucose,  Bld 140 (*) 70 - 99 mg/dL   BUN 32 (*) 6 - 23 mg/dL   Creatinine, Ser 6.42 (*) 0.50 - 1.35 mg/dL   Calcium 7.9 (*) 8.4 - 10.5 mg/dL   GFR calc non Af Amer 9 (*) >90 mL/min   GFR calc Af Amer 10 (*) >90 mL/min   Comment: (NOTE)     The eGFR has been calculated using the CKD EPI equation.     This calculation has not been validated in all clinical situations.     eGFR's persistently <90 mL/min signify possible Chronic Kidney     Disease.  LIPASE, BLOOD     Status: None   Collection Time    04/28/13  4:22 AM      Result Value Ref Range   Lipase 32  11 - 59 U/L  HEPATIC FUNCTION PANEL     Status: Abnormal   Collection Time    04/28/13  4:22 AM      Result Value Ref Range   Total Protein 7.6  6.0 - 8.3 g/dL   Albumin 3.1 (*) 3.5 - 5.2 g/dL   AST 48 (*) 0 - 37 U/L   ALT 66 (*) 0 - 53 U/L   Alkaline Phosphatase 188 (*) 39 - 117 U/L   Total Bilirubin 0.2 (*) 0.3 - 1.2 mg/dL   Bilirubin, Direct <0.2  0.0 - 0.3 mg/dL   Indirect Bilirubin NOT  CALCULATED  0.3 - 0.9 mg/dL  GLUCOSE, CAPILLARY     Status: Abnormal   Collection Time    04/28/13  7:42 AM      Result Value Ref Range   Glucose-Capillary 110 (*) 70 - 99 mg/dL  GLUCOSE, CAPILLARY     Status: Abnormal   Collection Time    04/28/13 11:10 AM      Result Value Ref Range   Glucose-Capillary 175 (*) 70 - 99 mg/dL  GLUCOSE, CAPILLARY     Status: Abnormal   Collection Time    04/28/13  4:49 PM      Result Value Ref Range   Glucose-Capillary 143 (*) 70 - 99 mg/dL   Comment 1 Notify RN    BASIC METABOLIC PANEL     Status: Abnormal   Collection Time    04/28/13  7:27 PM      Result Value Ref Range   Sodium 138  137 - 147 mEq/L   Potassium 5.0  3.7 - 5.3 mEq/L   Chloride 96  96 - 112 mEq/L   CO2 30  19 - 32 mEq/L   Glucose, Bld 193 (*) 70 - 99 mg/dL   BUN 20  6 - 23 mg/dL   Creatinine, Ser 5.14 (*) 0.50 - 1.35 mg/dL   Calcium 8.3 (*) 8.4 - 10.5 mg/dL   GFR calc non Af Amer 12 (*) >90 mL/min   GFR calc Af Amer 14 (*) >90 mL/min   Comment: (NOTE)     The eGFR has been calculated using the CKD EPI equation.     This calculation has not been validated in all clinical situations.     eGFR's persistently <90 mL/min signify possible Chronic Kidney     Disease.  GLUCOSE, CAPILLARY     Status: Abnormal   Collection Time    04/28/13  9:05 PM      Result Value Ref Range   Glucose-Capillary 168 (*) 70 - 99 mg/dL   Comment 1 Documented in Chart     Comment 2 Notify RN  BASIC METABOLIC PANEL     Status: Abnormal   Collection Time    04/29/13  5:06 AM      Result Value Ref Range   Sodium 139  137 - 147 mEq/L   Potassium 5.1  3.7 - 5.3 mEq/L   Chloride 98  96 - 112 mEq/L   CO2 26  19 - 32 mEq/L   Glucose, Bld 157 (*) 70 - 99 mg/dL   BUN 25 (*) 6 - 23 mg/dL   Creatinine, Ser 6.00 (*) 0.50 - 1.35 mg/dL   Calcium 7.9 (*) 8.4 - 10.5 mg/dL   GFR calc non Af Amer 10 (*) >90 mL/min   GFR calc Af Amer 11 (*) >90 mL/min   Comment: (NOTE)     The eGFR has been calculated  using the CKD EPI equation.     This calculation has not been validated in all clinical situations.     eGFR's persistently <90 mL/min signify possible Chronic Kidney     Disease.  PHOSPHORUS     Status: Abnormal   Collection Time    04/29/13  5:06 AM      Result Value Ref Range   Phosphorus 6.1 (*) 2.3 - 4.6 mg/dL   Labs are reviewed and are pertinent for medical issues being addressed.  Current Facility-Administered Medications  Medication Dose Route Frequency Provider Last Rate Last Dose  . 0.9 %  sodium chloride infusion  100 mL Intravenous PRN Manpreet S Bhutani, MD      . 0.9 %  sodium chloride infusion  100 mL Intravenous PRN Manpreet S Bhutani, MD      . 0.9 %  sodium chloride infusion  100 mL Intravenous PRN Harriett Sine, MD      . 0.9 %  sodium chloride infusion  100 mL Intravenous PRN Harriett Sine, MD      . calcium acetate (PHOSLO) capsule 1,334 mg  1,334 mg Oral TID WC Manpreet Toya Smothers, MD   1,334 mg at 04/29/13 0929  . epoetin alfa (EPOGEN,PROCRIT) injection 4,000 Units  4,000 Units Intravenous Once per day on Mon Wed Fri Liana Gerold, MD   4,000 Units at 04/27/13 1302  . feeding supplement (NEPRO CARB STEADY) liquid 237 mL  237 mL Oral TID WC Nimish C Anastasio Champion, MD   237 mL at 04/29/13 0929  . feeding supplement (NEPRO CARB STEADY) liquid 237 mL  237 mL Oral PRN Manpreet Toya Smothers, MD      . folic acid (FOLVITE) tablet 1 mg  1 mg Oral Daily Nimish C Gosrani, MD   1 mg at 04/29/13 0930  . heparin injection 1,000 Units  1,000 Units Dialysis PRN Liana Gerold, MD      . heparin injection 1,400 Units  20 Units/kg Dialysis PRN Liana Gerold, MD   1,400 Units at 04/28/13 1250  . heparin injection 5,000 Units  5,000 Units Subcutaneous 3 times per day Doree Albee, MD   5,000 Units at 04/28/13 0617  . insulin aspart (novoLOG) injection 0-5 Units  0-5 Units Subcutaneous QHS Nimish C Gosrani, MD      . insulin aspart (novoLOG) injection 0-9 Units  0-9  Units Subcutaneous TID WC Doree Albee, MD   1 Units at 04/28/13 1731  . ipratropium-albuterol (DUONEB) 0.5-2.5 (3) MG/3ML nebulizer solution 3 mL  3 mL Nebulization Q6H PRN Nimish C Gosrani, MD      . lidocaine (PF) (XYLOCAINE) 1 % injection 5 mL  5 mL Intradermal PRN Manpreet Toya Smothers, MD      . lidocaine-prilocaine (EMLA) cream 1 application  1 application Topical PRN Manpreet Toya Smothers, MD      . neomycin-bacitracin-polymyxin (NEOSPORIN) ointment   Topical BID Liana Gerold, MD   1 application at 15/72/62 2102  . ondansetron (ZOFRAN) tablet 4 mg  4 mg Oral Q6H PRN Nimish Luther Parody, MD       Or  . ondansetron (ZOFRAN) injection 4 mg  4 mg Intravenous Q6H PRN Nimish C Gosrani, MD      . oxyCODONE (Oxy IR/ROXICODONE) immediate release tablet 30 mg  30 mg Oral Q8H PRN Doree Albee, MD   30 mg at 04/28/13 2100  . pantoprazole (PROTONIX) EC tablet 40 mg  40 mg Oral Daily Nimish C Gosrani, MD   40 mg at 04/29/13 0930  . pentafluoroprop-tetrafluoroeth (GEBAUERS) aerosol 1 application  1 application Topical PRN Manpreet Toya Smothers, MD      . sodium bicarbonate injection 50 mEq  50 mEq Intravenous Once Theressa Millard, MD      . thiamine (VITAMIN B-1) tablet 100 mg  100 mg Oral Daily Nimish C Gosrani, MD   100 mg at 04/29/13 0930  . tiotropium (SPIRIVA) inhalation capsule 18 mcg  18 mcg Inhalation Daily Doree Albee, MD   18 mcg at 04/29/13 0733    Psychiatric Specialty Exam:     Blood pressure 126/66, pulse 73, temperature 98 F (36.7 C), temperature source Oral, resp. rate 20, height 6' (1.829 m), weight 147 lb 3.2 oz (66.769 kg), SpO2 97.00%.Body mass index is 19.96 kg/(m^2).  General Appearance: Casual  Eye Contact::  Good  Speech:  Normal Rate  Volume:  Normal  Mood:  Anxious  Affect:  Congruent  Thought Process:  Coherent  Orientation:  Full (Time, Place, and Person)  Thought Content:  WDL  Suicidal Thoughts:  No  Homicidal Thoughts:  No  Memory:  Immediate;    Good Recent;   Good Remote;   Good  Judgement:  Good  Insight:  Good  Psychomotor Activity:  Normal  Concentration:  Good  Recall:  Good  Akathisia:  No  Handed:  Right  AIMS (if indicated):     Assets:  Housing Leisure Time Resilience Social Support  Sleep:      Treatment Plan Summary: Discharge home with follow-up for any concerns.  Social worker will provide provider names for therapy for patient to have and use if desired.    Disposition:  Discharge home  Waylan Boga, [place] 04/29/2013 10:19 AM    Patient seen, evaluated and I agree with notes by Nurse Practitioner. Corena Pilgrim, MD

## 2013-04-29 NOTE — Clinical Social Work Note (Signed)
Patient offered outpatient counseling options in [facility].  Patient not eligible for services at [facility] because he only has Medicare coverage at present.  Patient given referrals to [facility], states he will not be able to access because he cannot afford the copays required under his Medicare.  Patient given Geophysicist/field seismologist, encouraged to contact Medicaid at DSS to discuss his situation, as well as encouraged to discuss need for additional resources w social worker at dialysis center.  Patient states he intends to return home today, will try to move to another state where he believes he can obtain additional resources.  Edwyna Shell, LCSW Clinical Social Worker [phone])

## 2013-05-04 ENCOUNTER — Institutional Professional Consult (permissible substitution): Payer: Self-pay | Admitting: Pulmonary Disease

## 2013-05-19 ENCOUNTER — Encounter: Payer: Self-pay | Admitting: Vascular Surgery

## 2013-05-20 ENCOUNTER — Ambulatory Visit (INDEPENDENT_AMBULATORY_CARE_PROVIDER_SITE_OTHER): Payer: Medicare Other | Admitting: Vascular Surgery

## 2013-05-20 ENCOUNTER — Encounter: Payer: Self-pay | Admitting: Vascular Surgery

## 2013-05-20 VITALS — BP 170/84 | HR 75 | Ht 72.0 in | Wt 155.0 lb

## 2013-05-20 DIAGNOSIS — N186 End stage renal disease: Secondary | ICD-10-CM | POA: Insufficient documentation

## 2013-05-20 NOTE — Progress Notes (Signed)
    Postoperative Access Visit   History of Present Illness  Kuper Morrical is a 53 y.o. year old male who presents for postoperative follow-up for: R arm fistulogram, venoplasty of R innominate vein (Date: 04/06/13).  The right arm swelling is completely resolved.  The patient notes no steal symptoms.  The patient is no able to complete their activities of daily living.  The patient's current symptoms are: none.  For VQI Use Only  PRE-ADM LIVING: Home  AMB STATUS: Ambulatory  Physical Examination Filed Vitals:   05/20/13 0839  BP: 170/84  Pulse: 75    RUE: puncture is healed, skin feels warm, hand grip is 5/5, sensation in digits is intact, palpable thrill, bruit can be auscultated , LIJ TDC still in place  Medical Decision Making  Va Careaga is a 53 y.o. year old male who presents s/p R BVT venoplasty.  The patient's access is ready for use.  The patient's tunneled dialysis catheter can be removed after two successful cannulations and completed dialysis treatments.  Thank you for allowing [place] to participate in this patient's care.  Adele Barthel, MD [facility] Office: [phone] Pager: [phone]  05/20/2013, 8:52 AM

## 2013-05-23 NOTE — Addendum Note (Signed)
Addended by: Dorthula Rue L on: 05/23/2013 04:16 PM   Modules accepted: Orders

## 2013-06-08 ENCOUNTER — Encounter (HOSPITAL_COMMUNITY): Payer: Self-pay

## 2013-08-18 ENCOUNTER — Encounter: Payer: Self-pay | Admitting: Vascular Surgery

## 2013-08-19 ENCOUNTER — Ambulatory Visit: Payer: Self-pay | Admitting: Vascular Surgery

## 2013-08-19 ENCOUNTER — Inpatient Hospital Stay (HOSPITAL_COMMUNITY): Admission: RE | Admit: 2013-08-19 | Payer: Self-pay | Source: Ambulatory Visit

## 2013-09-29 ENCOUNTER — Encounter: Payer: Self-pay | Admitting: Vascular Surgery

## 2013-09-30 ENCOUNTER — Ambulatory Visit: Payer: Self-pay | Admitting: Vascular Surgery

## 2013-09-30 ENCOUNTER — Other Ambulatory Visit (HOSPITAL_COMMUNITY): Payer: Self-pay

## 2013-10-27 ENCOUNTER — Encounter: Payer: Self-pay | Admitting: Vascular Surgery

## 2013-10-28 ENCOUNTER — Ambulatory Visit: Payer: Self-pay | Admitting: Vascular Surgery

## 2013-10-28 ENCOUNTER — Other Ambulatory Visit (HOSPITAL_COMMUNITY): Payer: Self-pay

## 2014-01-12 ENCOUNTER — Encounter (HOSPITAL_COMMUNITY): Payer: Self-pay | Admitting: Cardiovascular Disease

## 2014-02-16 ENCOUNTER — Encounter (HOSPITAL_COMMUNITY): Payer: Self-pay | Admitting: Ophthalmology

## 2014-04-14 IMAGING — CR DG CHEST 1V PORT
2 series · 2 of 2 positions shown · non-contrast
Comparison: 04/08/2013

CLINICAL DATA: Respiratory failure

EXAM:
PORTABLE CHEST - 1 VIEW

[AP (1 of 2)]
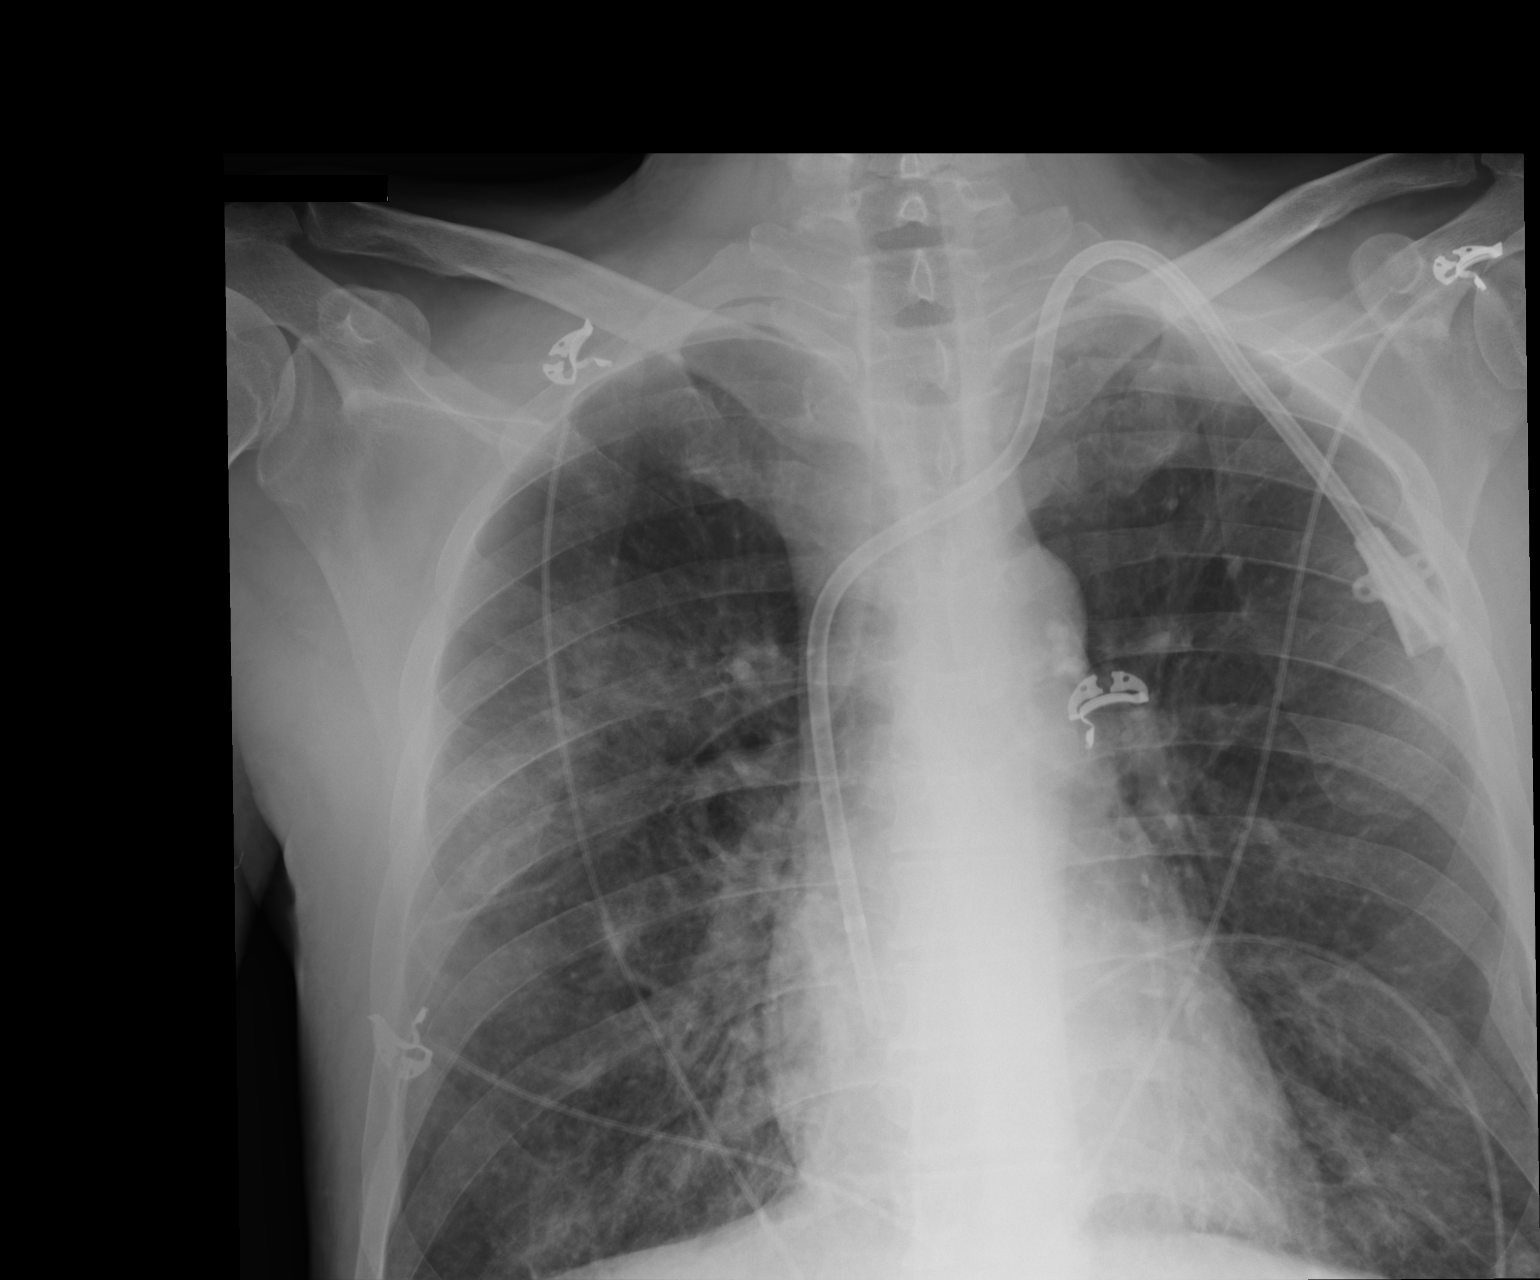

[AP (2 of 2)]
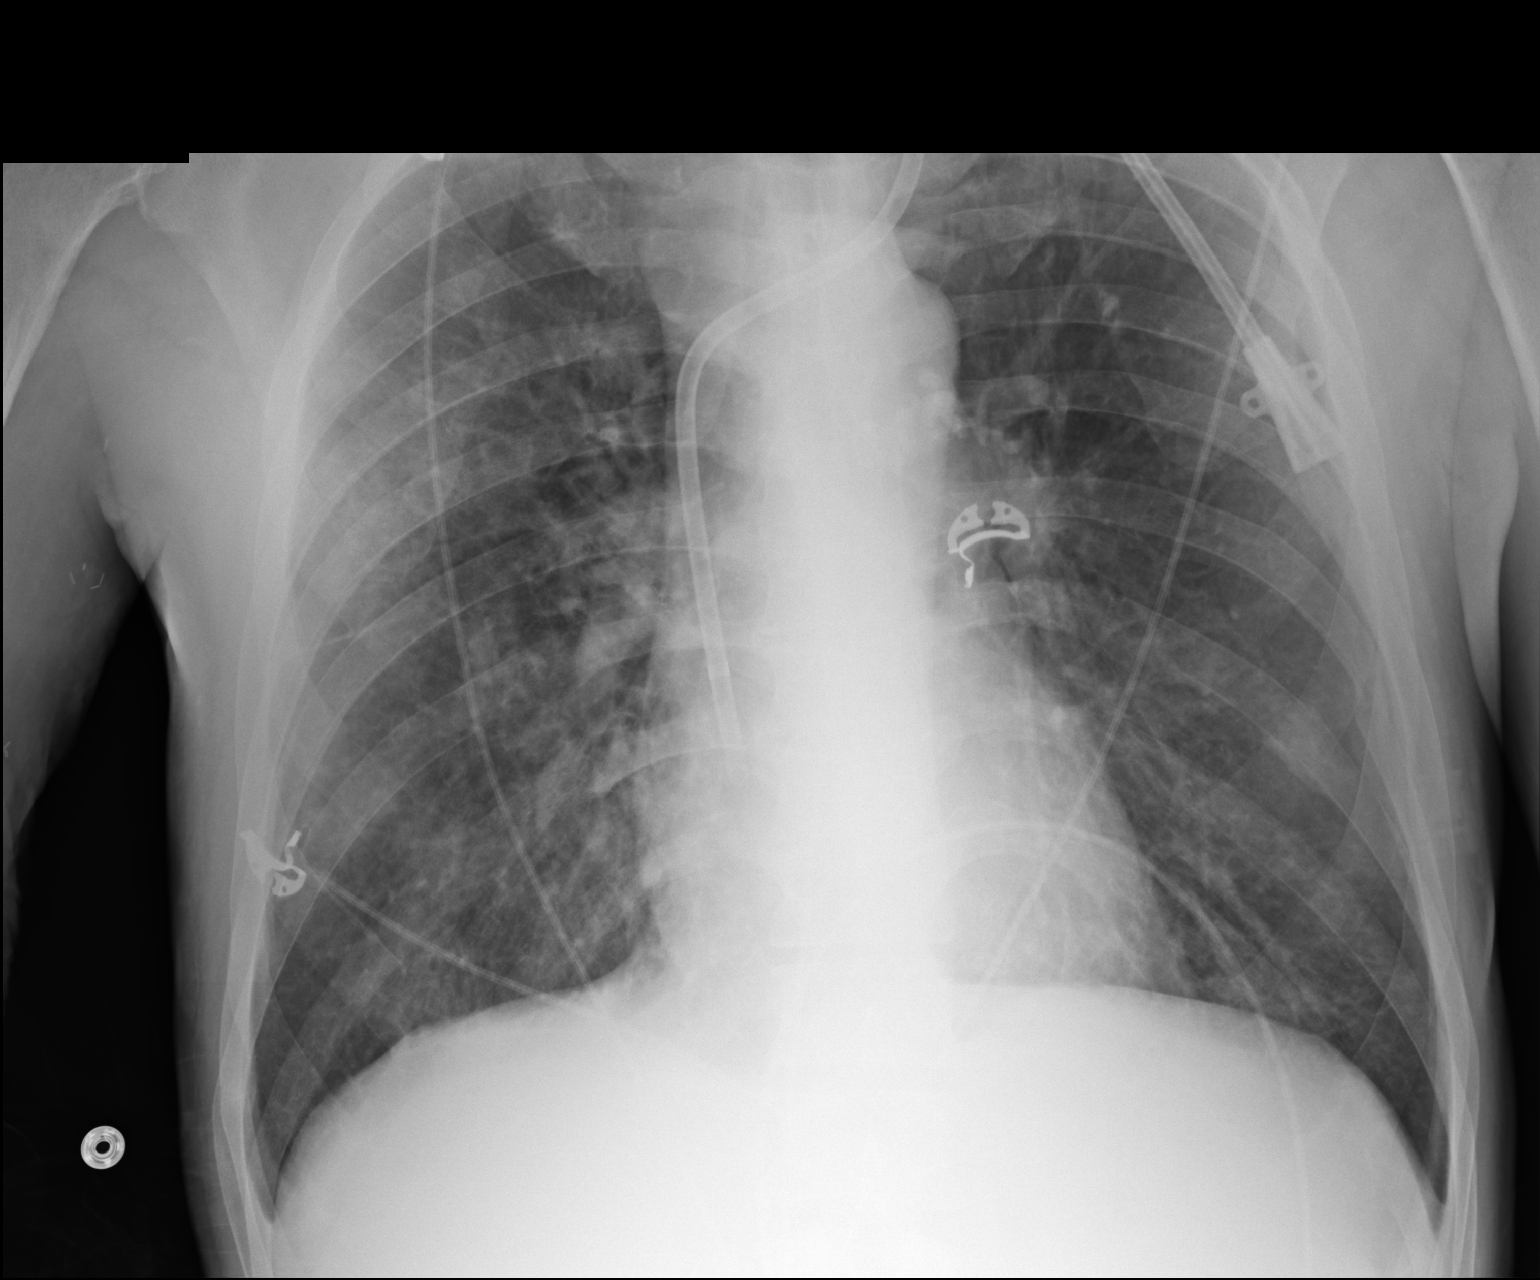

[2 of 2 positions shown; findings below may reference images not displayed]

FINDINGS: There is a left chest wall dialysis catheter with tips in the right
atrium. The heart size is normal. Airspace consolidation within the
right upper lobe appears improved from previous exam.
IMPRESSION: 1. Improving aeration to the right upper lobe.

## 2014-05-23 NOTE — Op Note (Signed)
PATIENT NAME:  Joe Vincent, Joe Vincent MR#:  [id number] DATE OF BIRTH:  1960/07/03  DATE OF PROCEDURE:  12/03/2011  PREOPERATIVE DIAGNOSES:  1. Dense vitreous hemorrhage.  2. Proliferative diabetic retinopathy.   POSTOPERATIVE DIAGNOSES:  1. Dense vitreous hemorrhage.  2. Proliferative diabetic retinopathy.  3. Tractional retinal detachment.   PROCEDURES PERFORMED: 1. Pars plana vitrectomy of the left eye.  2. Endolaser of the left eye.  3. Air exchange of the left eye.  4. Tractional retinal detachment repair of the left eye.   PRIMARY SURGEON: Garlan Fair, MD  ANESTHESIA: Retrobulbar block of the left eye with monitored anesthesia care.   COMPLICATIONS: None.   ESTIMATED BLOOD LOSS: Less than 1 mL.  INDICATION FOR PROCEDURE: This is a patient who presented to my office with loss of vision in his left eye. Examination revealed a dense vitreous hemorrhage with no view of the posterior pole. B-scan ultrasound revealed that there was no sign of rhegmatogenous retinal detachment. Risks, benefits, and alternatives of the above procedure were discussed and the patient wished to proceed.   DETAILS OF PROCEDURE: After informed consent was obtained, the patient was brought to the operative suite at [facility]. The patient was placed in supine position and was given a small dose of propofol and a retrobulbar block was performed on the left eye by the primary surgeon without any complications. The left eye was prepped and draped in sterile manner. After lid speculum was inserted, a 23-gauge trocar was placed through displaced conjunctiva in an oblique fashion in inferotemporal quadrant 3 mm beyond the limbus. Infusion cannula was turned on and inserted through the trocar and secured into position with Steri-Strips. Two more trocars were placed in a similar fashion superotemporally and superonasally. Vitreous cutter and light pipe were introduced in the eye and a core vitrectomy  was performed. Special care was taken to remove blood from anterior to posterior until visualization of the retina was achieved. Once visualization of the retina was achieved, the vitreous face was confirmed to be elevated at the equator. The vitreous face was amputated in a 360 degree fashion, at the equator, in order to relieve any traction from anterior to posterior. Once this was completed, peripheral vitreous was trimmed for 360 degrees. Posterior vitreous was trimmed down the posterior face. As much blood as possible was removed using suction. Once the blood was removed, it revealed a tractional retinal detachment along the superior arcade moving halfway between the temporal arcade and the fovea. A combination of vertical scissors and forceps were utilized in order to relieve this traction and peel the membrane. No breaks or tears could be identified. Multiple small stubs of oozing blood vessels were identified and these were cauterized. Care was taken to avoid cautery of the optic nerve. One or two small oozing vessels on the optic nerve were left alone because of this. Once this was completed, endolaser was introduced into the eye and complete panretinal photocoagulation was performed for 360 degrees. At the conclusion of the laser, portions of the blood clot over the optic nerve was debulked, but not removed, in order to maintain as much visualization as possible with care to prevent any further bleeding. Time was allowed to elapse to confirm that there was no further bleeding. Once this was done, scleral depressed exam was performed and no signs of any breaks or tears could be identified. Inferior vitreous base blood was removed as much as possible via direct visualization and scleral depression. Once this  was completed, a 90% air-fluid exchange was performed. Trocars were removed. Two of the sclerotomies were closed using 6-0 plain gut given that they had a small air leak. 5 mg of dexamethasone was given  into the inferior fornix. The lid speculum was removed. The eye was cleaned and TobraDex was placed on the eye. A patch and shield were placed over the eye and the patient was undraped and then redraped for AV fistula creation on the right arm. The patient was given instructions to remain head up.  ____________________________ Teresa Pelton. Dell Briner, MD mfa:slb D: 12/03/2011 14:22:00 ET     T: 12/03/2011 14:42:34 ET       JOB#: [id number] cc: Teresa Pelton. Starling Manns, MD, <Dictator> Coralee Rud MD ELECTRONICALLY SIGNED 01/07/2012 6:55

## 2014-05-26 NOTE — Op Note (Signed)
PATIENT NAME:  Joe Vincent, Joe Vincent MR#:  [id number] DATE OF BIRTH:  1961-01-20  DATE OF PROCEDURE:  04/05/2012  PREOPERATIVE DIAGNOSES: 1.  Endstage renal disease.  2.  Clotted right arm AV fistula.  3.  Diabetes.  4.  Colon cancer.   POSTOPERATIVE DIAGNOSES:   1.  Endstage renal disease.  2.  Clotted right arm AV fistula.  3.  Diabetes.  4.  Colon cancer.   PROCEDURES PERFORMED:  1.  Ultrasound guidance for vascular access in both an antegrade and retrograde fashion to the right arm AV fistula.  2.  Right upper extremity fistulogram and central venogram.  3.  Catheter directed thrombolysis with 4 mg of TPA throughout the right brachiocephalic AV fistula and central venous thrombosis.  4.  Mechanical rheolytic thrombectomy of same vessels.  5.  Percutaneous transluminal angioplasty of peri-anastomotic stenosis with a 7 mm diameter angioplasty balloon.  6.  Percutaneous transluminal angioplasty of mid upper arm cephalic vein for residual thrombosis causing flow limitation with a 10 mm diameter angioplasty balloon.  7. Percutaneous transluminal angioplasty of in the right innominate vein and superior vena cava with 10 and 12 mm diameter angioplasty balloon for residual stenosis and thrombosis causing flow limitation.   SURGEON: Algernon Huxley, M.D.   ANESTHESIA: Local with moderate conscious sedation.   ESTIMATED BLOOD LOSS: Approximately 25 mL.   INDICATION FOR PROCEDURE: A 54 year old white male with end-stage renal disease. His dialysis access has clotted and we are attempting to salvage this today. Risks and benefits were discussed. Informed consent was obtained.   DESCRIPTION OF PROCEDURE: The patient is brought to the vascular interventional radiology suite. The right upper extremity was sterilely prepped and draped and a sterile surgical field was created. The fistula was accessed under direct ultrasound guidance to pulseless nature of the fistula with a micropuncture needle,  micropuncture wire and sheath were placed both in an antegrade and retrograde fashion crossing. Upsized to a 6 [place] sheath and gave the patient 3000 units of intravenous heparin. Magic torque wires placed in the brachial artery through the atrium and into the inferior vena cava, the fistula was seen to be thrombosed. Catheter directed thrombolysis performed with 4 mg of TPA instilled from the fistula anastomosis to the central venous circulation. This was allowed to dwell. This uncovered a central venous thrombosis and likely some stenosis. He had a catheter in place. The wire crossed this with some difficulty with Kumpe catheter and Magic torque wire, but was able to successfully cross. This central venous area was treated with proxy AngioJet catheter and not just the AngioJet AVX catheter that was used on the rest of the fistula for mechanical rheolytic thrombectomy. Following this there was a peri-anastomotic stenosis that I treated with a 7 mm diameter angioplasty balloon with an excellent angiographic completion result. There were still significant thrombus and stenosis in the mid upper arm cephalic vein. I treated this area with a 10 mm diameter angioplasty balloon with good angiographic completion result. The retrograde sheath was removed around a 4-0 Monocryl pursestring suture and pressure was held. At this point, the major remaining was the  central venous thrombosis and stenosis. This is when the proxy AngioJet catheter was used to treat this area and I ballooned first with a 10 and then a 12 mm diameter angioplasty balloon. Following this, there was markedly improved flow with an excellent thrill through the AV fistula. There was no hemodynamically significant residual stenosis or thrombosis in the superior vena cava  or innominate vein and I elected to terminate the procedure. The sheath was removed around a 4-0 Monocryl pursestring suture. Pressure was held. Sterile dressing was placed. The patient  tolerated the procedure well and was taken to the recovery room in stable condition.    ____________________________ Algernon Huxley, MD jsd:cc D: 04/05/2012 14:53:25 ET T: 04/05/2012 17:37:53 ET JOB#: [id number]  cc: Algernon Huxley, MD, <Dictator> [place] MD ELECTRONICALLY SIGNED 04/08/2012 9:42
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
## 2014-05-26 NOTE — Discharge Summary (Signed)
PATIENT NAME:  Joe Vincent, Joe Vincent MR#:  [id number] DATE OF BIRTH:  [birth year]  DATE OF ADMISSION:  04/02/2012 DATE OF DISCHARGE:  04/03/2012  For a detailed note, please take a look at the history and physical done on admission by Dr. Fritzi Mandes.   DIAGNOSES AT DISCHARGE: Acute hyperkalemia, now improved; end-stage renal disease, on hemodialysis on Tuesday, Thursday, Saturday; clotted right upper extremity arteriovenous fistula; diabetes; history of colon cancer, status post ileostomy; chronic pain syndrome.   DIET: The patient is being discharged on American Diabetic Association, low sodium, low fat diet.   ACTIVITY: As tolerated.   FOLLOWUP: With Dr. Hortencia Pilar in the next 1 to 2 days. The patient is going to have his AV fistula declotted this coming Monday.   DISCHARGE MEDICATIONS: Lantus 15 units at bedtime, Valium 10 mg b.i.d. as needed, Dilaudid 4 mg b.i.d. as needed, NovoLog Flex pen sliding scale, calcium acetate 667 mg 4 caps t.i.d., multivitamin which is Nephro-Vite 1 tab daily, midodrine 5 mg 2 tabs t.i.d. and Kayexalate 30 mL daily for the next 2 to 3 days.   [place] COURSE: Dr. Hortencia Pilar from vascular surgery, Dr. Juleen China from nephrology.   BRIEF HOSPITAL COURSE: This is a 54 year old male with medical problems as mentioned above who presented to the hospital on 04/02/2012 secondary to hyperkalemia.  1.  Hyperkalemia: This was likely secondary to his end-stage renal disease. The patient apparently had a right upper extremity fistula which was clotted. He came in for declotting, although since his potassium was high, the procedure was canceled. He had a temporary dialysis catheter placed. The patient has received some Kayexalate and some partial dialysis and his potassium has come down to 5. I am presently discharging him on some Kayexalate daily, to have his AV fistula declotted so he can have his dialysis this coming Monday.  2.  End-stage renal  disease, on hemodialysis. The patient normally gets dialyzed on Tuesday, Thursday, Saturday. Again, he received some partial dialysis yesterday with a temporary catheter in his right subclavian. His AV fistula has been clotted off. He is going to coming in to the hospital this coming Monday for probable declotting of his fistula. This was not done while in the hospital, as she had a high potassium. It was supposed to be done this coming Monday, but the patient would prefer to go home and come back as an outpatient.  3.  Diabetes. The patient's blood sugars have remained stable. He will continue his Lantus and sliding scale insulin as stated.  4.  Chronic pain syndrome. The patient will continue his Dilaudid as stated.  5.  Relative hypotension. 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 The patient is on midodrine. He will continue that.   CODE STATUS: The patient is a full code.   TIME SPENT: 40 minutes.    ____________________________ Belia Heman. Verdell Carmine, MD vjs:jm D: 04/03/2012 14:39:40 ET T: 04/03/2012 15:51:23 ET JOB#: [id number]  cc: Belia Heman. Verdell Carmine, MD, <Dictator> Henreitta Leber MD ELECTRONICALLY SIGNED 04/05/2012 8:09

## 2014-05-26 NOTE — Op Note (Signed)
PATIENT NAME:  Joe Vincent, Joe Vincent MR#:  [id number] DATE OF BIRTH:  1960/12/19  DATE OF PROCEDURE:  02/25/2012  PREOPERATIVE DIAGNOSIS: Visually significant posterior sclerotic cataract.   POSTOPERATIVE DIAGNOSIS: Visually significant posterior sclerotic cataract.   PROCEDURE PERFORMED:  Phacoemulsification and intraocular lens insertion of the right eye.  PRIMARY SURGEON: Garlan Fair, MD   ESTIMATED BLOOD LOSS: Less than 1 mL.  ANESTHESIA: Retrobulbar block of the right eye with monitored anesthesia care.   INDICATIONS FOR PROCEDURE: This is a patient who presented to my office with decreased vision in his right eye. Examination revealed a very dense posterior sclerotic cataract of the right eye. The risks, benefits, and alternatives of the above procedure were discussed, and the patient wished to proceed.   DETAILS OF PROCEDURE: After informed consent was obtained, the patient was brought into the operative suite at [facility]. The patient was placed in supine position, was given a small dose of Alfenta, and a retrobulbar block was performed on the right eye by the primary surgeon without complication. The right eye was prepped and draped in sterile manner. After a lid speculum was inserted, a side-port wound was created at approximately 11:30 o'clock. DisCoVisc was injected into the anterior chamber to maintain it. A keratome blade was used to create a main corneal wound at approximately 12 o'clock. A cystotome was introduced in the eye, and a continuous anterior capsulorrhexis was created without any complication. The lens was hydrodissected using BSS on a 26-gauge cannula. The lens was rotated for 180 degrees.  A phacoemulsification wand was introduced in the eye and the lens was broken into 4 quadrants. The quadrants were removed without complication. The remnant cortical material was removed using INA. DisCoVisc was injected into the capsular bag after it had been  polished. A 23.5-diopter Technis ZCBOO lens, serial [id number], was introduced into the capsular bag and rotated into position. The DisCoVisc was removed using INA. The wounds were hydrated using BSS. The wounds were noted to be watertight, and pressure in the eye was confirmed to be approximately 15 mmHg.  Total ultrasound time was 1 minute 21 seconds. The eye was cleaned and Vigamox and TobraDex were placed on the eye. A patch and shield were placed over the eye, and the patient was taken to postanesthesia care with instructions to remain head up.   ____________________________ Teresa Pelton. Starling Manns, MD mfa:cb D: 02/25/2012 09:49:01 ET T: 02/25/2012 10:12:24 ET JOB#: [id number]  cc: Teresa Pelton. Starling Manns, MD, <Dictator> Coralee Rud MD ELECTRONICALLY SIGNED 03/30/2012 6:46

## 2014-05-26 NOTE — Op Note (Signed)
PATIENT NAME:  BOBBYJOE, PABST MR#:  [id number] DATE OF BIRTH:  10/06/1960  DATE OF PROCEDURE:  04/02/2012  PREOPERATIVE DIAGNOSES:  1.  Complication of arteriovenous dialysis device.  2.  Thrombosis of right arm arteriovenous fistula.  3.  End-stage renal disease requiring hemodialysis.   POSTOPERATIVE DIAGNOSES: 1.  Complication of arteriovenous dialysis device.  2.  Thrombosis of right arm arteriovenous fistula.    3.  End-stage renal disease requiring hemodialysis.   PROCEDURE PERFORMED:  Insertion of right IJ cuffed tunneled dialysis catheter with ultrasound and fluoroscopic guidance. Excision foreign body, right chest wall.  PROCEDURE PERFORMED BY:  Katha Cabal, MD.   SEDATION:  Versed 10 mg plus fentanyl 500 mcg administered IV. Continuous ECG, pulse oximetry and cardiopulmonary monitoring is performed throughout the entire procedure by the Interventional Radiology nurse. The total sedation time was 45 minutes.   ACCESS:  Right IJ.   CONTRAST USED:  None.   FLUORO TIME:  0.5 minutes.   INDICATIONS:  The patient is a 54 year old gentleman on hemodialysis who presented to Dialysis with a thrombosed fistula. He is therefore referred for attempted salvage of the fistula. Unfortunately, laboratory values show that he has life-threatening hyperkalemia and is therefore not a candidate for thrombolysis at this time. He is, therefore, undergoing a placement of a tunneled catheter so that he can be treated after his hyperkalemia is resolved.   DESCRIPTION OF PROCEDURE:  The patient is taken to Special Procedures and placed in the supine position. After adequate sedation is achieved, the right neck and chest wall are prepped and draped in a sterile fashion. Ultrasound is placed in a sterile sleeve. Ultrasound is utilized secondary to lack of appropriate landmarks and to avoid vascular injury. Under direct ultrasound visualization, the jugular vein is identified. It is echolucent and  compressible indicating patency. The image is recorded for the permanent record. The jugular vein is identified. It is compressible indicating patency. Image is recorded for the permanent record, and a puncture is made using a micropuncture needle with real-time ultrasound visualization. Microwire followed micro sheath, J-wire followed by a serial dilatation and insertion of the peel-away sheath. A 19-cm tip-to-cuff Cannon catheter is then advanced through the peel-away sheath and the peel-away sheath is removed. A tunnel was then created using the tunneling device, and the catheter is pulled subcutaneously.   The hub is then connected. Both lumens aspirate and flush easily. The catheter is secured to the skin of the chest with an 0 silk. The neck counterincision is closed with a 4-0 Monocryl subcuticular and Dermabond, and the catheter is packed with 5000 units of heparin per lumen.   The patient also noted, in the vicinity of his catheter, there is what he thought was a stitch. On evaluation, this turned out to be the cuff from the previous catheter. Whoever removed this catheter in the past did not remove the cuff in its entirety. Next 1% lidocaine is infiltrated in the soft tissues. An elliptical incision is made with a scalpel and the cuff is removed. The wound is then closed with 4-0 Monocryl subcuticular and Dermabond is applied. The patient tolerated the procedure well, and there were no immediate complications.    ____________________________ Katha Cabal, MD ggs:jm D: 04/02/2012 18:23:19 ET T: 04/03/2012 11:31:24 ET JOB#: [id number]  cc: Katha Cabal, MD, <Dictator> Katha Cabal MD ELECTRONICALLY SIGNED 05/04/2012 17:18
